# Patient Record
Sex: Female | Born: 1945
Health system: Southern US, Community
[De-identification: ages and names within clinical notes are randomized; demographics above are authoritative.]

## PROBLEM LIST (undated history)

## (undated) DIAGNOSIS — M899 Disorder of bone, unspecified: Secondary | ICD-10-CM

## (undated) DIAGNOSIS — M1990 Unspecified osteoarthritis, unspecified site: Secondary | ICD-10-CM

## (undated) DIAGNOSIS — Z79899 Other long term (current) drug therapy: Secondary | ICD-10-CM

## (undated) DIAGNOSIS — R5383 Other fatigue: Secondary | ICD-10-CM

## (undated) DIAGNOSIS — R51 Headache: Secondary | ICD-10-CM

## (undated) DIAGNOSIS — M949 Disorder of cartilage, unspecified: Secondary | ICD-10-CM

## (undated) DIAGNOSIS — M810 Age-related osteoporosis without current pathological fracture: Secondary | ICD-10-CM

## (undated) DIAGNOSIS — K219 Gastro-esophageal reflux disease without esophagitis: Secondary | ICD-10-CM

## (undated) DIAGNOSIS — L2089 Other atopic dermatitis: Secondary | ICD-10-CM

## (undated) DIAGNOSIS — J210 Acute bronchiolitis due to respiratory syncytial virus: Secondary | ICD-10-CM

## (undated) DIAGNOSIS — J218 Acute bronchiolitis due to other specified organisms: Secondary | ICD-10-CM

## (undated) DIAGNOSIS — Z7901 Long term (current) use of anticoagulants: Secondary | ICD-10-CM

## (undated) DIAGNOSIS — Z789 Other specified health status: Secondary | ICD-10-CM

## (undated) DIAGNOSIS — E559 Vitamin D deficiency, unspecified: Secondary | ICD-10-CM

## (undated) DIAGNOSIS — R209 Unspecified disturbances of skin sensation: Secondary | ICD-10-CM

## (undated) DIAGNOSIS — Z01818 Encounter for other preprocedural examination: Secondary | ICD-10-CM

## (undated) DIAGNOSIS — M1710 Unilateral primary osteoarthritis, unspecified knee: Secondary | ICD-10-CM

## (undated) DIAGNOSIS — IMO0002 Reserved for concepts with insufficient information to code with codable children: Secondary | ICD-10-CM

## (undated) DIAGNOSIS — R5381 Other malaise: Secondary | ICD-10-CM

## (undated) DIAGNOSIS — E785 Hyperlipidemia, unspecified: Secondary | ICD-10-CM

## (undated) DIAGNOSIS — E039 Hypothyroidism, unspecified: Secondary | ICD-10-CM

## (undated) HISTORY — DX: Other long term (current) drug therapy: Z79.899

## (undated) HISTORY — DX: Other fatigue: R53.83

## (undated) HISTORY — DX: Hyperlipidemia, unspecified: E78.5

## (undated) HISTORY — DX: Acute bronchiolitis due to other specified organisms: J21.8

## (undated) HISTORY — DX: Disorder of bone, unspecified: M89.9

## (undated) HISTORY — DX: Encounter for other preprocedural examination: Z01.818

## (undated) HISTORY — DX: Age-related osteoporosis without current pathological fracture: M81.0

## (undated) HISTORY — DX: Reserved for concepts with insufficient information to code with codable children: IMO0002

## (undated) HISTORY — DX: Other atopic dermatitis: L20.89

## (undated) HISTORY — DX: Vitamin D deficiency, unspecified: E55.9

## (undated) HISTORY — DX: Long term (current) use of anticoagulants: Z79.01

## (undated) HISTORY — PX: OTHER SURGICAL HISTORY: SHX169

## (undated) HISTORY — DX: Other malaise: R53.81

## (undated) HISTORY — DX: Disorder of cartilage, unspecified: M94.9

## (undated) HISTORY — PX: CERVICAL BIOPSY  W/ LOOP ELECTRODE EXCISION: SUR135

## (undated) HISTORY — DX: Acute bronchiolitis due to respiratory syncytial virus: J21.0

## (undated) HISTORY — DX: Unilateral primary osteoarthritis, unspecified knee: M17.10

## (undated) HISTORY — DX: Unspecified disturbances of skin sensation: R20.9

---

## 2009-09-02 ENCOUNTER — Encounter: Admission: RE | Admit: 2009-09-02 | Discharge: 2009-09-02 | Payer: Self-pay | Admitting: Internal Medicine

## 2009-09-02 LAB — HM DEXA SCAN

## 2010-05-28 ENCOUNTER — Encounter: Payer: Self-pay | Admitting: Internal Medicine

## 2011-01-30 ENCOUNTER — Ambulatory Visit: Payer: Medicare Other | Attending: Sports Medicine | Admitting: Physical Therapy

## 2011-01-30 DIAGNOSIS — M25669 Stiffness of unspecified knee, not elsewhere classified: Secondary | ICD-10-CM | POA: Insufficient documentation

## 2011-01-30 DIAGNOSIS — M25569 Pain in unspecified knee: Secondary | ICD-10-CM | POA: Insufficient documentation

## 2011-01-30 DIAGNOSIS — M545 Low back pain, unspecified: Secondary | ICD-10-CM | POA: Insufficient documentation

## 2011-01-30 DIAGNOSIS — IMO0001 Reserved for inherently not codable concepts without codable children: Secondary | ICD-10-CM | POA: Insufficient documentation

## 2011-04-02 ENCOUNTER — Other Ambulatory Visit: Payer: Self-pay | Admitting: Internal Medicine

## 2011-04-02 DIAGNOSIS — Z1231 Encounter for screening mammogram for malignant neoplasm of breast: Secondary | ICD-10-CM

## 2011-04-19 ENCOUNTER — Ambulatory Visit
Admission: RE | Admit: 2011-04-19 | Discharge: 2011-04-19 | Disposition: A | Discharge status: Home / Self Care | Payer: Medicare Other | Source: Ambulatory Visit | Attending: Internal Medicine | Admitting: Internal Medicine

## 2011-04-19 DIAGNOSIS — Z1231 Encounter for screening mammogram for malignant neoplasm of breast: Secondary | ICD-10-CM

## 2011-04-19 LAB — HM MAMMOGRAPHY: HM Mammogram: NEGATIVE

## 2011-04-24 ENCOUNTER — Ambulatory Visit: Payer: Medicare Other

## 2011-11-07 DIAGNOSIS — Z01818 Encounter for other preprocedural examination: Secondary | ICD-10-CM

## 2011-11-07 HISTORY — DX: Encounter for other preprocedural examination: Z01.818

## 2011-11-21 NOTE — H&P (Signed)
[name] [name] DOB: 12/31/1945  Chief Complaint: Right knee pain   History of Present Illness The patient is a 66 year old female who comes in today for a preoperative History and Physical. The patient is scheduled for a right total knee arthroplasty to be performed by Dr. Georges Lynch. [name] Hillock, MD at [facility] on Wednesday November 28, 2011 . [name] [name] started having pain in the right knee at the end of 2012. She did not injure the knee that she was aware of. She has pain with weightbearing and at times has trouble with instability in the knee. She has had cortisone injections in the knee with limited improvement in her symptoms. Due to failure of conservative measures, the most predictable means for increased function and decreased pain in the right knee is a right total knee arthroplasty. Risks and benefits discussed.    Problem List/Past MedicalHistory Thigh pain (729.5) Radiculitis, Thoracic or Lumbar (724.4) Osteoarthritis, Knee (715.96) Lumbar disc degeneration (722.52) Bursitis, Hip (726.5) Hypercholesterolemia Hypothyroidism Osteoporosis Impaired Vision. wears glasses   Allergies No Known Drug Allergies.    Family History Father. deceased age 45; unsure of cause Mother. deceased age 76 due to "old age"   Social History Children. 3 Alcohol use. never consumed alcohol Current work status. retired Marital status. married Illicit drug use. yes Tobacco use. never smoker   Medication History Omeprazole Magnesium (20MG  Tablet DR, Oral) Active. Levothyroxine Sodium ( Tablet, Oral) Active. Simvastatin (40MG  Tablet, Oral) Active. Alendronate Sodium (70MG  Tablet Effer, Oral) Active. Aspirin EC (81MG  Tablet DR, Oral) Active. Calcium 600 (600MG  Tablet, Oral) Active. Vitamin B 12 ( Oral) Specific dose unknown - Active.   Past Surgical History No pertinent past surgical history    Review of Systems General:Not Present-  Chills, Fever, Night Sweats, Fatigue, Weight Gain, Weight Loss and Memory Loss. Skin:Not Present- Hives, Itching, Rash, Eczema and Lesions. HEENT:Present- Double Vision. Not Present- Tinnitus, Headache, Visual Loss, Hearing Loss and Dentures. Respiratory:Not Present- Shortness of breath with exertion, Shortness of breath at rest, Allergies, Coughing up blood and Chronic Cough. Cardiovascular:Not Present- Chest Pain, Racing/skipping heartbeats, Difficulty Breathing Lying Down, Murmur, Swelling and Palpitations. Gastrointestinal:Not Present- Bloody Stool, Heartburn, Abdominal Pain, Vomiting, Nausea, Constipation, Diarrhea, Difficulty Swallowing, Jaundice and Loss of appetitie. Female Genitourinary:Not Present- Blood in Urine, Urinary frequency, Weak urinary stream, Discharge, Flank Pain, Incontinence, Painful Urination, Urgency, Urinary Retention and Urinating at Night. Musculoskeletal:Present- Joint Swelling and Joint Pain. Not Present- Muscle Weakness, Muscle Pain, Back Pain, Morning Stiffness and Spasms. Neurological:Not Present- Tremor, Dizziness, Blackout spells, Paralysis, Difficulty with balance and Weakness. Psychiatric:Not Present- Insomnia.   Vitals Weight: 125 lb Height: 58 in Body Surface Area: 1.52 m Body Mass Index: 26.12 kg/m Pulse: 88 (Regular) BP: 141/95 (Sitting, Left Arm, Standard)    Physical Exam General Mental Status - Alert, cooperative and good historian. General Appearance- pleasant. Not in acute distress. Orientation- Oriented X3. Build & Nutrition- Well nourished and Well developed. Head and Neck Head- normocephalic, atraumatic . Neck Global Assessment- supple. no bruit auscultated on the right and no bruit auscultated on the left. Eye Pupil- Bilateral- Regular and Round. Motion- Bilateral- EOMI. Chest and Lung Exam Auscultation: Breath sounds:- clear at anterior chest wall and - clear at posterior chest  wall. Adventitious sounds:- No Adventitious sounds. Cardiovascular Auscultation:Rhythm- Regular rate and rhythm. Heart Sounds- S1 WNL and S2 WNL. Murmurs & Other Heart Sounds:Auscultation of the heart reveals - No Murmurs. Abdomen Palpation/Percussion:Tenderness- Abdomen is non-tender to palpation. Rigidity (guarding)- Abdomen is soft. Auscultation:Auscultation  of the abdomen reveals - Bowel sounds normal. Female Genitourinary Not done, not pertinent to present illness Peripheral Vascular Upper Extremity: Palpation:- Pulses bilaterally normal Neurologic Examination of related systems reveals - normal muscle strength and tone in all extremities. Neurologic evaluation reveals - normal sensation and upper and lower extremity deep tendon reflexes intact bilaterally . Musculoskeletal Range of motion of her right knee is painful. She has a genu varus. She has swelling over the medial aspect of her knee. No masses and no tumors. Her popliteal space is fine. Extension 0 degrees to flexion of 110 degrees. Moderate patellofemoral crepitus.   Assessment & Plan Osteoarthritis, Knee (715.96) Right total knee arthroplasty     Dimitri Ped, PA-C

## 2011-11-22 ENCOUNTER — Encounter (HOSPITAL_COMMUNITY)
Admission: RE | Admit: 2011-11-22 | Discharge: 2011-11-22 | Disposition: A | Discharge status: Home / Self Care | Payer: Medicare Other | Source: Ambulatory Visit | Attending: Orthopedic Surgery | Admitting: Orthopedic Surgery

## 2011-11-22 ENCOUNTER — Encounter (HOSPITAL_COMMUNITY): Payer: Self-pay | Admitting: Pharmacy Technician

## 2011-11-22 ENCOUNTER — Encounter (HOSPITAL_COMMUNITY): Payer: Self-pay

## 2011-11-22 HISTORY — DX: Other specified health status: Z78.9

## 2011-11-22 HISTORY — DX: Unspecified osteoarthritis, unspecified site: M19.90

## 2011-11-22 HISTORY — DX: Hypothyroidism, unspecified: E03.9

## 2011-11-22 HISTORY — DX: Gastro-esophageal reflux disease without esophagitis: K21.9

## 2011-11-22 HISTORY — DX: Headache: R51

## 2011-11-22 LAB — CBC
HCT: 40.5 % (ref 36.0–46.0)
Hemoglobin: 13.7 g/dL (ref 12.0–15.0)
MCH: 30.3 pg (ref 26.0–34.0)
MCHC: 33.8 g/dL (ref 30.0–36.0)
MCV: 89.6 fL (ref 78.0–100.0)
Platelets: 312 10*3/uL (ref 150–400)
RBC: 4.52 MIL/uL (ref 3.87–5.11)
RDW: 12.6 % (ref 11.5–15.5)
WBC: 9 10*3/uL (ref 4.0–10.5)

## 2011-11-22 LAB — COMPREHENSIVE METABOLIC PANEL
ALT: 38 U/L — ABNORMAL HIGH (ref 0–35)
AST: 29 U/L (ref 0–37)
Albumin: 4.2 g/dL (ref 3.5–5.2)
Alkaline Phosphatase: 75 U/L (ref 39–117)
BUN: 8 mg/dL (ref 6–23)
CO2: 27 mEq/L (ref 19–32)
Calcium: 10.1 mg/dL (ref 8.4–10.5)
Chloride: 102 mEq/L (ref 96–112)
Creatinine, Ser: 0.57 mg/dL (ref 0.50–1.10)
GFR calc Af Amer: >90 mL/min (ref >90)
GFR calc non Af Amer: >90 mL/min (ref >90)
Glucose, Bld: 105 mg/dL — ABNORMAL HIGH (ref 70–99)
Potassium: 4.3 mEq/L (ref 3.5–5.1)
Sodium: 140 mEq/L (ref 135–145)
Total Bilirubin: 0.5 mg/dL (ref 0.3–1.2)
Total Protein: 7.6 g/dL (ref 6.0–8.3)

## 2011-11-22 LAB — PROTIME-INR
INR: 0.97 (ref 0.00–1.49)
Prothrombin Time: 13.1 seconds (ref 11.6–15.2)

## 2011-11-22 LAB — SURGICAL PCR SCREEN
MRSA, PCR: NEGATIVE
Staphylococcus aureus: NEGATIVE

## 2011-11-22 LAB — DIFFERENTIAL
Basophils Absolute: 0.1 10*3/uL (ref 0.0–0.1)
Basophils Relative: 1 % (ref 0–1)
Eosinophils Absolute: 0.1 10*3/uL (ref 0.0–0.7)
Eosinophils Relative: 1 % (ref 0–5)
Lymphocytes Relative: 31 % (ref 12–46)
Lymphs Abs: 2.8 10*3/uL (ref 0.7–4.0)
Monocytes Absolute: 0.9 10*3/uL (ref 0.1–1.0)
Monocytes Relative: 10 % (ref 3–12)
Neutro Abs: 5.2 10*3/uL (ref 1.7–7.7)
Neutrophils Relative %: 57 % (ref 43–77)

## 2011-11-22 LAB — URINALYSIS, ROUTINE W REFLEX MICROSCOPIC
Bilirubin Urine: NEGATIVE
Glucose, UA: NEGATIVE mg/dL
Ketones, ur: NEGATIVE mg/dL
Leukocytes, UA: NEGATIVE
Nitrite: NEGATIVE
Protein, ur: NEGATIVE mg/dL
Specific Gravity, Urine: 1.009 (ref 1.005–1.030)
Urobilinogen, UA: 0.2 mg/dL (ref 0.0–1.0)
pH: 7 (ref 5.0–8.0)

## 2011-11-22 LAB — URINE MICROSCOPIC-ADD ON

## 2011-11-22 LAB — APTT: aPTT: 31 seconds (ref 24–37)

## 2011-11-22 NOTE — Patient Instructions (Addendum)
YOUR SURGERY IS SCHEDULED ON:  WED  7/24  AT 8:15 AM  REPORT TO [facility] AT:  6:15 AM      PHONE # FOR SHORT STAY IS [phone]  DO NOT EAT OR DRINK ANYTHING AFTER MIDNIGHT THE NIGHT BEFORE YOUR SURGERY.  YOU MAY BRUSH YOUR TEETH, RINSE OUT YOUR MOUTH--BUT NO WATER, NO FOOD, NO CHEWING GUM, NO MINTS, NO CANDIES, NO CHEWING TOBACCO.  PLEASE TAKE THE FOLLOWING MEDICATIONS THE AM OF YOUR SURGERY WITH A FEW SIPS OF WATER:  LEVOTHYROXINE AND OMEPRAZOLE    IF YOU USE INHALERS--USE YOUR INHALERS THE AM OF YOUR SURGERY AND BRING INHALERS TO THE HOSPITAL -TAKE TO SURGERY.    IF YOU ARE DIABETIC:  DO NOT TAKE ANY DIABETIC MEDICATIONS THE AM OF YOUR SURGERY.  IF YOU TAKE INSULIN IN THE EVENINGS--PLEASE ONLY TAKE 1/2 NORMAL EVENING DOSE THE NIGHT BEFORE YOUR SURGERY.  NO INSULIN THE AM OF YOUR SURGERY.  IF YOU HAVE SLEEP APNEA AND USE CPAP OR BIPAP--PLEASE BRING THE MASK --NOT THE MACHINE-NOT THE TUBING   -JUST THE MASK. DO NOT BRING VALUABLES, MONEY, CREDIT CARDS.  CONTACT LENS, DENTURES / PARTIALS, GLASSES SHOULD NOT BE WORN TO SURGERY AND IN MOST CASES-HEARING AIDS WILL NEED TO BE REMOVED.  BRING YOUR GLASSES CASE, ANY EQUIPMENT NEEDED FOR YOUR CONTACT LENS. FOR PATIENTS ADMITTED TO THE HOSPITAL--CHECK OUT TIME THE DAY OF DISCHARGE IS 11:00 AM.  ALL INPATIENT ROOMS ARE PRIVATE - WITH BATHROOM, TELEPHONE, TELEVISION AND WIFI INTERNET. IF YOU ARE BEING DISCHARGED THE SAME DAY OF YOUR SURGERY--YOU CAN NOT DRIVE YOURSELF HOME--AND SHOULD NOT GO HOME ALONE BY TAXI OR BUS.  NO DRIVING OR OPERATING MACHINERY FOR 24 HOURS FOLLOWING ANESTHESIA / PAIN MEDICATIONS.                            SPECIAL INSTRUCTIONS:  CHLORHEXIDINE SOAP SHOWER (other brand names are Betasept and Hibiclens ) PLEASE SHOWER WITH CHLORHEXIDINE THE NIGHT BEFORE YOUR SURGERY AND THE AM OF YOUR SURGERY. DO NOT USE CHLORHEXIDINE ON YOUR FACE OR PRIVATE AREAS--YOU MAY USE YOUR NORMAL SOAP THOSE AREAS AND YOUR NORMAL  SHAMPOO.  WOMEN SHOULD AVOID SHAVING UNDER ARMS AND SHAVING LEGS 48 HOURS BEFORE USING CHLORHEXIDINE TO AVOID SKIN IRRITATION.  DO NOT USE IF ALLERGIC TO CHLORHEXIDINE.  PLEASE READ OVER ANY  FACT SHEETS THAT YOU WERE GIVEN: MRSA INFORMATION, BLOOD TRANSFUSION INFORMATION, INCENTIVE SPIROMETER INFORMATION.

## 2011-11-22 NOTE — Pre-Procedure Instructions (Signed)
PT HAS AN EKG REPORT, OFFICE NOTE AND MEDICAL CLEARANCE FOR HER RIGHT TOTAL KNEE REPLACEMENT--REPORTS FROM 11/07/11  [facility] / DR. PANDEY. CXR NOT NEEDED PREOP - PER ANESTHESIOLOGIST'S GUIDELINES. CBC WITH DIFF, CMET, PT, PTT AND UA WERE DONE TODAY - PREOP - AT [facility]-AS PER ORDERS DR. Darrelyn Hillock AND THE T/S WILL BE DRAWN ON ARRIVAL FOR SURGERY. PT ACCOMPANIED TO PREOP APPT BY HER HUSBAND AND HER SON RAJ--PT AND HUSBAND UNDERSTAND AND SPEAK SOME ENGLISH - BUT SHE NEEDED HER SON TO HELP WITH INTERPRETATION AND PT SIGNED A RELEASE OF INTERPRETATION FOR RAJ.  I HAVE REQUESTED GUJARATI OR HINDI INTERPRETER FOR DAY OF SURGERY VIA [facility]. PREOP INSTRUCTIONS DISCUSSED WITH PT AND HER FAMILY USING TEACH BACK METHOD.

## 2011-11-28 ENCOUNTER — Inpatient Hospital Stay (HOSPITAL_COMMUNITY)
Admission: RE | Admit: 2011-11-28 | Discharge: 2011-12-01 | DRG: 470 | Disposition: A | Discharge status: Home Health | Payer: Medicare Other | Source: Ambulatory Visit | Attending: Orthopedic Surgery | Admitting: Orthopedic Surgery

## 2011-11-28 ENCOUNTER — Encounter (HOSPITAL_COMMUNITY): Payer: Self-pay | Admitting: Anesthesiology

## 2011-11-28 ENCOUNTER — Inpatient Hospital Stay (HOSPITAL_COMMUNITY): Payer: Medicare Other

## 2011-11-28 ENCOUNTER — Encounter (HOSPITAL_COMMUNITY)
Admission: RE | Disposition: A | Discharge status: Home Health | Payer: Self-pay | Source: Ambulatory Visit | Attending: Orthopedic Surgery

## 2011-11-28 ENCOUNTER — Encounter (HOSPITAL_COMMUNITY): Payer: Self-pay | Admitting: *Deleted

## 2011-11-28 ENCOUNTER — Ambulatory Visit (HOSPITAL_COMMUNITY): Payer: Medicare Other | Admitting: Anesthesiology

## 2011-11-28 DIAGNOSIS — M1710 Unilateral primary osteoarthritis, unspecified knee: Principal | ICD-10-CM | POA: Diagnosis present

## 2011-11-28 DIAGNOSIS — K219 Gastro-esophageal reflux disease without esophagitis: Secondary | ICD-10-CM | POA: Diagnosis present

## 2011-11-28 DIAGNOSIS — Z96659 Presence of unspecified artificial knee joint: Secondary | ICD-10-CM

## 2011-11-28 DIAGNOSIS — E039 Hypothyroidism, unspecified: Secondary | ICD-10-CM | POA: Diagnosis present

## 2011-11-28 DIAGNOSIS — D62 Acute posthemorrhagic anemia: Secondary | ICD-10-CM | POA: Diagnosis not present

## 2011-11-28 DIAGNOSIS — Z01812 Encounter for preprocedural laboratory examination: Secondary | ICD-10-CM

## 2011-11-28 HISTORY — PX: TOTAL KNEE ARTHROPLASTY: SHX125

## 2011-11-28 LAB — TYPE AND SCREEN
ABO/RH(D): O POS
Antibody Screen: NEGATIVE

## 2011-11-28 SURGERY — ARTHROPLASTY, KNEE, TOTAL
Anesthesia: General | Site: Knee | Laterality: Right | Wound class: Clean

## 2011-11-28 MED ORDER — CEFAZOLIN SODIUM 1-5 GM-% IV SOLN
1.0000 g | Freq: Four times a day (QID) | INTRAVENOUS | Status: AC
  Administered 2011-11-28 (×2): 1 g via INTRAVENOUS
  Filled 2011-11-28 (×2): qty 50

## 2011-11-28 MED ORDER — LACTATED RINGERS IV SOLN
INTRAVENOUS | Status: DC
  Administered 2011-11-28: 500 mL via INTRAVENOUS
  Administered 2011-11-28 – 2011-11-29 (×4): via INTRAVENOUS

## 2011-11-28 MED ORDER — ONDANSETRON HCL 4 MG/2ML IJ SOLN
4.0000 mg | Freq: Four times a day (QID) | INTRAMUSCULAR | Status: DC | PRN
  Administered 2011-11-28 – 2011-11-30 (×2): 4 mg via INTRAVENOUS
  Filled 2011-11-28 (×2): qty 2

## 2011-11-28 MED ORDER — LIDOCAINE HCL (CARDIAC) 20 MG/ML IV SOLN
INTRAVENOUS | Status: DC | PRN
  Administered 2011-11-28: 50 mg via INTRAVENOUS

## 2011-11-28 MED ORDER — FERROUS SULFATE 325 (65 FE) MG PO TABS
325.0000 mg | ORAL_TABLET | Freq: Three times a day (TID) | ORAL | Status: DC
  Administered 2011-11-28 – 2011-12-01 (×8): 325 mg via ORAL
  Filled 2011-11-28 (×12): qty 1

## 2011-11-28 MED ORDER — PHENOL 1.4 % MT LIQD
1.0000 | OROMUCOSAL | Status: DC | PRN
  Administered 2011-11-28: 1 via OROMUCOSAL
  Filled 2011-11-28: qty 177

## 2011-11-28 MED ORDER — PHENYLEPHRINE HCL 10 MG/ML IJ SOLN
INTRAMUSCULAR | Status: DC | PRN
  Administered 2011-11-28: 20 ug via INTRAVENOUS
  Administered 2011-11-28 (×2): 10 ug via INTRAVENOUS
  Administered 2011-11-28: 40 ug via INTRAVENOUS

## 2011-11-28 MED ORDER — HYDROMORPHONE HCL PF 1 MG/ML IJ SOLN
INTRAMUSCULAR | Status: DC | PRN
Start: 2011-11-28 — End: 2011-11-28
  Administered 2011-11-28: .5 mg via INTRAVENOUS
  Administered 2011-11-28 (×3): 0.5 mg via INTRAVENOUS

## 2011-11-28 MED ORDER — ONDANSETRON HCL 4 MG/2ML IJ SOLN
INTRAMUSCULAR | Status: DC | PRN
  Administered 2011-11-28 (×2): 2 mg via INTRAVENOUS

## 2011-11-28 MED ORDER — CEFAZOLIN SODIUM-DEXTROSE 2-3 GM-% IV SOLR
INTRAVENOUS | Status: AC
  Filled 2011-11-28: qty 50

## 2011-11-28 MED ORDER — DEXAMETHASONE SODIUM PHOSPHATE 10 MG/ML IJ SOLN
INTRAMUSCULAR | Status: DC | PRN
  Administered 2011-11-28: 10 mg via INTRAVENOUS

## 2011-11-28 MED ORDER — THROMBIN 5000 UNITS EX SOLR
CUTANEOUS | Status: DC | PRN
  Administered 2011-11-28: 5000 [IU] via TOPICAL

## 2011-11-28 MED ORDER — BUPIVACAINE LIPOSOME 1.3 % IJ SUSP
INTRAMUSCULAR | Status: DC | PRN
  Administered 2011-11-28: 40 mL

## 2011-11-28 MED ORDER — LACTATED RINGERS IV SOLN: INTRAVENOUS | Status: DC

## 2011-11-28 MED ORDER — ALUM & MAG HYDROXIDE-SIMETH 200-200-20 MG/5ML PO SUSP: 30.0000 mL | ORAL | Status: DC | PRN

## 2011-11-28 MED ORDER — THROMBIN 5000 UNITS EX SOLR
CUTANEOUS | Status: AC
  Filled 2011-11-28: qty 5000

## 2011-11-28 MED ORDER — SODIUM CHLORIDE 0.9 % IR SOLN
Status: DC | PRN
  Administered 2011-11-28: 08:00:00

## 2011-11-28 MED ORDER — BIOTENE DRY MOUTH MT LIQD
15.0000 mL | Freq: Two times a day (BID) | OROMUCOSAL | Status: DC
  Administered 2011-11-28 – 2011-11-30 (×5): 15 mL via OROMUCOSAL

## 2011-11-28 MED ORDER — OXYCODONE-ACETAMINOPHEN 5-325 MG PO TABS
2.0000 | ORAL_TABLET | ORAL | Status: DC | PRN
  Administered 2011-11-28 – 2011-11-30 (×7): 2 via ORAL
  Administered 2011-11-30: 1 via ORAL
  Administered 2011-11-30 (×3): 2 via ORAL
  Administered 2011-12-01 (×2): 1 via ORAL
  Filled 2011-11-28: qty 2
  Filled 2011-11-28 (×2): qty 1
  Filled 2011-11-28 (×3): qty 2
  Filled 2011-11-28: qty 1
  Filled 2011-11-28 (×6): qty 2

## 2011-11-28 MED ORDER — ACETAMINOPHEN 325 MG PO TABS: 650.0000 mg | ORAL_TABLET | Freq: Four times a day (QID) | ORAL | Status: DC | PRN

## 2011-11-28 MED ORDER — BACITRACIN ZINC 500 UNIT/GM EX OINT
TOPICAL_OINTMENT | CUTANEOUS | Status: AC
  Filled 2011-11-28: qty 15

## 2011-11-28 MED ORDER — BISACODYL 10 MG RE SUPP: 10.0000 mg | Freq: Every day | RECTAL | Status: DC | PRN

## 2011-11-28 MED ORDER — FLEET ENEMA 7-19 GM/118ML RE ENEM: 1.0000 | ENEMA | Freq: Once | RECTAL | Status: AC | PRN

## 2011-11-28 MED ORDER — LEVOTHYROXINE SODIUM 50 MCG PO TABS
50.0000 ug | ORAL_TABLET | Freq: Every day | ORAL | Status: DC
  Administered 2011-11-29 – 2011-12-01 (×3): 50 ug via ORAL
  Filled 2011-11-28 (×4): qty 1

## 2011-11-28 MED ORDER — PROMETHAZINE HCL 25 MG/ML IJ SOLN: 6.2500 mg | INTRAMUSCULAR | Status: DC | PRN

## 2011-11-28 MED ORDER — MENTHOL 3 MG MT LOZG
1.0000 | LOZENGE | OROMUCOSAL | Status: DC | PRN
  Filled 2011-11-28: qty 9

## 2011-11-28 MED ORDER — HYDROMORPHONE HCL PF 1 MG/ML IJ SOLN
INTRAMUSCULAR | Status: AC
  Administered 2011-11-28: 1 mg via INTRAVENOUS
  Filled 2011-11-28: qty 1

## 2011-11-28 MED ORDER — ONDANSETRON HCL 4 MG PO TABS: 4.0000 mg | ORAL_TABLET | Freq: Four times a day (QID) | ORAL | Status: DC | PRN

## 2011-11-28 MED ORDER — SODIUM CHLORIDE 0.9 % IR SOLN
Status: DC | PRN
  Administered 2011-11-28: 3000 mL

## 2011-11-28 MED ORDER — FENTANYL CITRATE 0.05 MG/ML IJ SOLN
INTRAMUSCULAR | Status: DC | PRN
  Administered 2011-11-28: 50 ug via INTRAVENOUS

## 2011-11-28 MED ORDER — MIDAZOLAM HCL 5 MG/5ML IJ SOLN
INTRAMUSCULAR | Status: DC | PRN
  Administered 2011-11-28: 0.5 mg via INTRAVENOUS
  Administered 2011-11-28: 1 mg via INTRAVENOUS

## 2011-11-28 MED ORDER — LACTATED RINGERS IV SOLN
INTRAVENOUS | Status: DC | PRN
  Administered 2011-11-28 (×3): via INTRAVENOUS

## 2011-11-28 MED ORDER — POLYETHYLENE GLYCOL 3350 17 G PO PACK: 17.0000 g | PACK | Freq: Every day | ORAL | Status: DC | PRN

## 2011-11-28 MED ORDER — CISATRACURIUM BESYLATE (PF) 10 MG/5ML IV SOLN
INTRAVENOUS | Status: DC | PRN
  Administered 2011-11-28: 6 mg via INTRAVENOUS

## 2011-11-28 MED ORDER — METOCLOPRAMIDE HCL 5 MG/ML IJ SOLN
INTRAMUSCULAR | Status: DC | PRN
  Administered 2011-11-28: 5 mg via INTRAVENOUS

## 2011-11-28 MED ORDER — SIMVASTATIN 40 MG PO TABS
40.0000 mg | ORAL_TABLET | Freq: Every evening | ORAL | Status: DC
  Administered 2011-11-28 – 2011-11-30 (×3): 40 mg via ORAL
  Filled 2011-11-28 (×5): qty 1

## 2011-11-28 MED ORDER — CEFAZOLIN SODIUM-DEXTROSE 2-3 GM-% IV SOLR
2.0000 g | INTRAVENOUS | Status: AC
  Administered 2011-11-28: 2 g via INTRAVENOUS

## 2011-11-28 MED ORDER — PROPOFOL 10 MG/ML IV EMUL
INTRAVENOUS | Status: DC | PRN
  Administered 2011-11-28: 25 mg via INTRAVENOUS
  Administered 2011-11-28: 150 mg via INTRAVENOUS
  Administered 2011-11-28: 25 mg via INTRAVENOUS

## 2011-11-28 MED ORDER — CHLORHEXIDINE GLUCONATE 4 % EX LIQD: 60.0000 mL | Freq: Once | CUTANEOUS | Status: DC

## 2011-11-28 MED ORDER — HYDROMORPHONE HCL PF 1 MG/ML IJ SOLN
1.0000 mg | INTRAMUSCULAR | Status: DC | PRN
  Administered 2011-11-28 – 2011-11-30 (×3): 1 mg via INTRAVENOUS
  Filled 2011-11-28 (×3): qty 1

## 2011-11-28 MED ORDER — METHOCARBAMOL 500 MG PO TABS
500.0000 mg | ORAL_TABLET | Freq: Four times a day (QID) | ORAL | Status: DC | PRN
  Administered 2011-11-29 (×3): 500 mg via ORAL
  Filled 2011-11-28 (×3): qty 1

## 2011-11-28 MED ORDER — HYDROMORPHONE HCL PF 1 MG/ML IJ SOLN
0.2500 mg | INTRAMUSCULAR | Status: DC | PRN
  Administered 2011-11-28 (×2): 0.25 mg via INTRAVENOUS

## 2011-11-28 MED ORDER — RIVAROXABAN 10 MG PO TABS
10.0000 mg | ORAL_TABLET | Freq: Every day | ORAL | Status: DC
  Administered 2011-11-29 – 2011-12-01 (×3): 10 mg via ORAL
  Filled 2011-11-28 (×4): qty 1

## 2011-11-28 MED ORDER — METHOCARBAMOL 100 MG/ML IJ SOLN
500.0000 mg | Freq: Four times a day (QID) | INTRAVENOUS | Status: DC | PRN
  Administered 2011-11-28: 500 mg via INTRAVENOUS
  Filled 2011-11-28: qty 5

## 2011-11-28 MED ORDER — SUFENTANIL CITRATE 50 MCG/ML IV SOLN
INTRAVENOUS | Status: DC | PRN
  Administered 2011-11-28: 20 ug via INTRAVENOUS
  Administered 2011-11-28: 5 ug via INTRAVENOUS
  Administered 2011-11-28 (×3): 10 ug via INTRAVENOUS
  Administered 2011-11-28: 20 ug via INTRAVENOUS
  Administered 2011-11-28: 5 ug via INTRAVENOUS

## 2011-11-28 MED ORDER — ACETAMINOPHEN 650 MG RE SUPP: 650.0000 mg | Freq: Four times a day (QID) | RECTAL | Status: DC | PRN

## 2011-11-28 MED ORDER — SUCCINYLCHOLINE CHLORIDE 20 MG/ML IJ SOLN
INTRAMUSCULAR | Status: DC | PRN
  Administered 2011-11-28: 100 mg via INTRAVENOUS

## 2011-11-28 MED ORDER — BUPIVACAINE LIPOSOME 1.3 % IJ SUSP
20.0000 mL | Freq: Once | INTRAMUSCULAR | Status: DC
  Filled 2011-11-28: qty 20

## 2011-11-28 SURGICAL SUPPLY — 67 items
BAG SPEC THK2 15X12 ZIP CLS (MISCELLANEOUS)
BAG ZIPLOCK 12X15 (MISCELLANEOUS) ×1 IMPLANT
BANDAGE ELASTIC 4 VELCRO ST LF (GAUZE/BANDAGES/DRESSINGS) ×2 IMPLANT
BANDAGE ELASTIC 6 VELCRO ST LF (GAUZE/BANDAGES/DRESSINGS) ×2 IMPLANT
BANDAGE ESMARK 6X9 LF (GAUZE/BANDAGES/DRESSINGS) ×1 IMPLANT
BANDAGE GAUZE ELAST BULKY 4 IN (GAUZE/BANDAGES/DRESSINGS) ×2 IMPLANT
BLADE SAG 18X100X1.27 (BLADE) ×2 IMPLANT
BLADE SAW SGTL 11.0X1.19X90.0M (BLADE) ×2 IMPLANT
BNDG CMPR 9X6 STRL LF SNTH (GAUZE/BANDAGES/DRESSINGS) ×1
BNDG ESMARK 6X9 LF (GAUZE/BANDAGES/DRESSINGS) ×2
BONE CEMENT GENTAMICIN (Cement) ×4 IMPLANT
CEMENT BONE GENTAMICIN 40 (Cement) ×2 IMPLANT
CLOTH BEACON ORANGE TIMEOUT ST (SAFETY) ×2 IMPLANT
CLSR STERI-STRIP ANTIMIC 1/2X4 (GAUZE/BANDAGES/DRESSINGS) ×2 IMPLANT
CUFF TOURN SGL QUICK 34 (TOURNIQUET CUFF) ×2
CUFF TRNQT CYL 34X4X40X1 (TOURNIQUET CUFF) ×1 IMPLANT
DRAPE EXTREMITY T 121X128X90 (DRAPE) ×2 IMPLANT
DRAPE INCISE IOBAN 66X45 STRL (DRAPES) ×2 IMPLANT
DRAPE LG THREE QUARTER DISP (DRAPES) ×2 IMPLANT
DRAPE POUCH INSTRU U-SHP 10X18 (DRAPES) ×2 IMPLANT
DRAPE U-SHAPE 47X51 STRL (DRAPES) ×2 IMPLANT
DRSG ADAPTIC 3X8 NADH LF (GAUZE/BANDAGES/DRESSINGS) ×2 IMPLANT
DRSG EMULSION OIL 3X16 NADH (GAUZE/BANDAGES/DRESSINGS) ×1 IMPLANT
DRSG PAD ABDOMINAL 8X10 ST (GAUZE/BANDAGES/DRESSINGS) ×5 IMPLANT
DURAPREP 26ML APPLICATOR (WOUND CARE) ×2 IMPLANT
ELECT REM PT RETURN 9FT ADLT (ELECTROSURGICAL) ×2
ELECTRODE REM PT RTRN 9FT ADLT (ELECTROSURGICAL) ×1 IMPLANT
EVACUATOR 1/8 PVC DRAIN (DRAIN) ×2 IMPLANT
FACESHIELD LNG OPTICON STERILE (SAFETY) ×12 IMPLANT
GLOVE BIOGEL PI IND STRL 8 (GLOVE) ×1 IMPLANT
GLOVE BIOGEL PI IND STRL 8.5 (GLOVE) IMPLANT
GLOVE BIOGEL PI INDICATOR 8 (GLOVE) ×1
GLOVE BIOGEL PI INDICATOR 8.5 (GLOVE)
GLOVE ECLIPSE 8.0 STRL XLNG CF (GLOVE) ×4 IMPLANT
GLOVE SURG SS PI 6.5 STRL IVOR (GLOVE) ×4 IMPLANT
GOWN PREVENTION PLUS LG XLONG (DISPOSABLE) ×4 IMPLANT
GOWN STRL REIN XL XLG (GOWN DISPOSABLE) ×2 IMPLANT
HANDPIECE INTERPULSE COAX TIP (DISPOSABLE) ×2
IMMOBILIZER KNEE 16 UNIV (MISCELLANEOUS) ×1 IMPLANT
IMMOBILIZER KNEE 20 (SOFTGOODS)
IMMOBILIZER KNEE 20 THIGH 36 (SOFTGOODS) IMPLANT
KIT BASIN OR (CUSTOM PROCEDURE TRAY) ×2 IMPLANT
MANIFOLD NEPTUNE II (INSTRUMENTS) ×2 IMPLANT
NEEDLE HYPO 22GX1.5 SAFETY (NEEDLE) ×2 IMPLANT
NS IRRIG 1000ML POUR BTL (IV SOLUTION) ×2 IMPLANT
PACK TOTAL JOINT (CUSTOM PROCEDURE TRAY) ×2 IMPLANT
POSITIONER SURGICAL ARM (MISCELLANEOUS) ×2 IMPLANT
SET HNDPC FAN SPRY TIP SCT (DISPOSABLE) ×1 IMPLANT
SET PAD KNEE POSITIONER (MISCELLANEOUS) ×2 IMPLANT
SPONGE GAUZE 4X4 12PLY (GAUZE/BANDAGES/DRESSINGS) ×1 IMPLANT
SPONGE LAP 18X18 X RAY DECT (DISPOSABLE) ×1 IMPLANT
SPONGE SURGIFOAM ABS GEL 100 (HEMOSTASIS) ×2 IMPLANT
STAPLER VISISTAT 35W (STAPLE) IMPLANT
SUCTION FRAZIER 12FR DISP (SUCTIONS) ×2 IMPLANT
SUT BONE WAX W31G (SUTURE) ×2 IMPLANT
SUT VIC AB 0 CT1 27 (SUTURE) ×4
SUT VIC AB 0 CT1 27XBRD ANTBC (SUTURE) ×2 IMPLANT
SUT VIC AB 1 CT1 27 (SUTURE) ×10
SUT VIC AB 1 CT1 27XBRD ANTBC (SUTURE) ×5 IMPLANT
SUT VIC AB 2-0 CT1 27 (SUTURE) ×6
SUT VIC AB 2-0 CT1 TAPERPNT 27 (SUTURE) ×3 IMPLANT
SYR 20CC LL (SYRINGE) ×2 IMPLANT
TOWEL OR 17X26 10 PK STRL BLUE (TOWEL DISPOSABLE) ×4 IMPLANT
TOWER CARTRIDGE SMART MIX (DISPOSABLE) ×2 IMPLANT
TRAY FOLEY CATH 14FRSI W/METER (CATHETERS) ×2 IMPLANT
WATER STERILE IRR 1500ML POUR (IV SOLUTION) ×2 IMPLANT
WRAP KNEE MAXI GEL POST OP (GAUZE/BANDAGES/DRESSINGS) ×4 IMPLANT

## 2011-11-28 NOTE — Progress Notes (Signed)
Utilization review completed.  

## 2011-11-28 NOTE — Anesthesia Postprocedure Evaluation (Signed)
Anesthesia Post Note  Patient: [name] [name]  Procedure(s) Performed: Procedure(s) (LRB): TOTAL KNEE ARTHROPLASTY (Right)  Anesthesia type: General  Patient location: PACU  Post pain: Pain level controlled  Post assessment: Post-op Vital signs reviewed  Last Vitals:  Filed Vitals:   11/28/11 1145  BP: 103/64  Pulse: 104  Temp:   Resp: 8    Post vital signs: Reviewed  Level of consciousness: sedated  Complications: No apparent anesthesia complications

## 2011-11-28 NOTE — Interval H&P Note (Signed)
History and Physical Interval Note:  11/28/2011 8:07 AM  [name] [name]  has presented today for surgery, with the diagnosis of osteoarthritis right knee  The various methods of treatment have been discussed with the patient and family. After consideration of risks, benefits and other options for treatment, the patient has consented to  Procedure(s) (LRB): TOTAL KNEE ARTHROPLASTY (Right) as a surgical intervention .  The patient's history has been reviewed, patient examined, no change in status, stable for surgery.  I have reviewed the patient's chart and labs.  Questions were answered to the patient's satisfaction.     Darcel Frane A

## 2011-11-28 NOTE — Preoperative (Signed)
Beta Blockers   Reason not to administer Beta Blockers:Not Applicable 

## 2011-11-28 NOTE — Brief Op Note (Signed)
11/28/2011  10:31 AM  PATIENT:  [name] [name]  66 y.o. female  PRE-OPERATIVE DIAGNOSIS:  osteoarthritis right knee  POST-OPERATIVE DIAGNOSIS:  osteoarthritis right knee  PROCEDURE:  Procedure(s) (LRB): TOTAL KNEE ARTHROPLASTY (Right)  SURGEON:  Surgeon(s) and Role:    * Jacki Cones, MD - Primary  PHYSICIAN ASSISTANT: Dimitri Ped PA    ANESTHESIA:   general  EBL:  Total I/O In: 1000 [I.V.:1000] Out: 375 [Urine:300; Other:75]  BLOOD ADMINISTERED:none  DRAINS: (1) Hemovact drain(s) in the right Knee with  Suction Open   LOCAL MEDICATIONS USED:  BUPIVICAINE 20cc mixed with 20cc of Normal Saline.  SPECIMEN:  No Specimen  DISPOSITION OF SPECIMEN:  N/A  COUNTS:  YES  TOURNIQUET:   Total Tourniquet Time Documented: Thigh (Right) - 92 minutes  DICTATION: .Other Dictation: Dictation Number [id number]  PLAN OF CARE: Admit to inpatient   PATIENT DISPOSITION:  Stable in OR   Delay start of Pharmacological VTE agent (>24hrs) due to surgical blood loss or risk of bleeding: yes

## 2011-11-28 NOTE — Anesthesia Procedure Notes (Signed)
Procedure Name: Intubation Date/Time: 11/28/2011 8:22 AM Performed by: Edison Pace Pre-anesthesia Checklist: Patient identified, Timeout performed, Emergency Drugs available, Suction available and Patient being monitored Patient Re-evaluated:Patient Re-evaluated prior to inductionOxygen Delivery Method: Circle system utilized Preoxygenation: Pre-oxygenation with 100% oxygen Intubation Type: IV induction and Cricoid Pressure applied Ventilation: Mask ventilation without difficulty Laryngoscope Size: Mac and 3 Grade View: Grade I Tube type: Oral Tube size: 7.5 mm Number of attempts: 1 Airway Equipment and Method: Stylet Placement Confirmation: ETT inserted through vocal cords under direct vision,  positive ETCO2 and breath sounds checked- equal and bilateral Secured at: 20 cm Tube secured with: Tape Dental Injury: Teeth and Oropharynx as per pre-operative assessment

## 2011-11-28 NOTE — Anesthesia Preprocedure Evaluation (Addendum)
Anesthesia Evaluation  Patient identified by MRN, date of birth, ID band Patient awake    Reviewed: Allergy & Precautions, H&P , NPO status , Patient's Chart, lab work & pertinent test results  Airway Mallampati: II TM Distance: >3 FB Neck ROM: Full    Dental  (+) Teeth Intact, Dental Advisory Given and Caps,    Pulmonary neg pulmonary ROS,  breath sounds clear to auscultation  Pulmonary exam normal       Cardiovascular negative cardio ROS  Rhythm:Regular Rate:Normal     Neuro/Psych  Headaches, negative psych ROS   GI/Hepatic Neg liver ROS, GERD-  Medicated,  Endo/Other  Hypothyroidism   Renal/GU negative Renal ROS  negative genitourinary   Musculoskeletal negative musculoskeletal ROS (+)   Abdominal   Peds negative pediatric ROS (+)  Hematology negative hematology ROS (+)   Anesthesia Other Findings   Reproductive/Obstetrics negative OB ROS                          Anesthesia Physical Anesthesia Plan  ASA: II  Anesthesia Plan: General   Post-op Pain Management:    Induction: Intravenous  Airway Management Planned: Oral ETT  Additional Equipment:   Intra-op Plan:   Post-operative Plan: Extubation in OR  Informed Consent: I have reviewed the patients History and Physical, chart, labs and discussed the procedure including the risks, benefits and alternatives for the proposed anesthesia with the patient or authorized representative who has indicated his/her understanding and acceptance.   Dental advisory given  Plan Discussed with: CRNA  Anesthesia Plan Comments:         Anesthesia Quick Evaluation

## 2011-11-28 NOTE — Transfer of Care (Signed)
Immediate Anesthesia Transfer of Care Note  Patient: [name] [name]  Procedure(s) Performed: Procedure(s) (LRB): TOTAL KNEE ARTHROPLASTY (Right)  Patient Location: PACU  Anesthesia Type: General  Level of Consciousness: awake, patient cooperative and confused  Airway & Oxygen Therapy: Patient Spontanous Breathing and Patient connected to face mask oxygen  Post-op Assessment: Report given to PACU RN, Post -op Vital signs reviewed and stable and Patient moving all extremities  Post vital signs: Reviewed and stable  Complications: No apparent anesthesia complications

## 2011-11-29 LAB — BASIC METABOLIC PANEL
Chloride: 102 mEq/L (ref 96–112)
GFR calc Af Amer: >90 mL/min (ref >90)
GFR calc non Af Amer: >90 mL/min (ref >90)
Potassium: 3.8 mEq/L (ref 3.5–5.1)
Sodium: 137 mEq/L (ref 135–145)

## 2011-11-29 LAB — CBC
MCHC: 34.7 g/dL (ref 30.0–36.0)
Platelets: 261 10*3/uL (ref 150–400)
RDW: 12.7 % (ref 11.5–15.5)
WBC: 17.7 10*3/uL — ABNORMAL HIGH (ref 4.0–10.5)

## 2011-11-29 MED ORDER — PANTOPRAZOLE SODIUM 40 MG PO TBEC
40.0000 mg | DELAYED_RELEASE_TABLET | Freq: Every day | ORAL | Status: DC
  Administered 2011-11-29 – 2011-11-30 (×2): 40 mg via ORAL
  Filled 2011-11-29 (×3): qty 1

## 2011-11-29 NOTE — Op Note (Signed)
NAMEADDISEN, CHAPPELLE              ACCOUNT NO.:  [id number]  MEDICAL RECORD NO.:  [id number]  LOCATION:  1617                         FACILITY[facility]  [facility]  PHYSICIAN:  Georges Lynch. Avier Jech, M.D.DATE OF BIRTH:  05-11-1945  DATE OF PROCEDURE:  11/28/2011 DATE OF DISCHARGE:                              OPERATIVE REPORT   SURGEON:  Georges Lynch. Darrelyn Hillock, MD.  ASSISTANT:  Dimitri Ped, PA.  PREOP DIAGNOSIS:  Severe degenerative arthritis of the right knee with bone-on-bone and a mild flexion contracture.  POSTOP DIAGNOSIS:  Severe degenerative arthritis of the right knee with bone-on-bone and a mild flexion contracture.  The operation was a right total knee arthroplasty, utilizing DePuy system.  Note we went through careful measurements preop because of her body size.  This was extremely small.  We utilized a size 2 right femur posterior cruciate sacrificing type, a size 2 tibial tray with a 32 mm patella with 3 pegs.  The insert was a 10 mm thickness size 2 insert rotating platform type, and gentamicin was used in the cement.  All 3 components were cemented.  PROCEDURE:  Under general anesthesia, routine orthopedic prep and draping of the right lower extremity carried out.  The appropriate time- out was carried out first.  Also marked the appropriate right leg in the holding area.  At this time, she had 2 g of IV Ancef.  The leg was exsanguinated with an Esmarch, tourniquet was elevated at 300 mmHg.  The knee was placed in the in the Mayo knee holder.  The knee was flexed in an anterior approach of the knee was carried out.  Two flaps were created.  Bleeders were identified and cauterized.  At this time, I did a median parapatellar incision reflected the patella laterally, flexed the knee and then did medial and lateral meniscectomies and excised the anterior and posterior cruciate ligaments.  Thus at this time, attention was paid to the small venous bleeders.  We did cauterized those.   We then made our initial drill hole in the intercondylar notch, removed 11 mm thickness off the distal femur with a retractor.  At this time, we then measured the femur to be a size 2 we inserted next jig and then carried out our anterior-posterior chamfering cuts.  Following that, we then prepared the tibia for a size 2 tibial tray.  The appropriate amount of bone was taken from the tibial plateau as well, a drill hole was made initially in the tibia and we utilized alignment guide to remove 6 mm thickness of the affected medial side.  Following the bone cuts we then made sure we had good soft tissue release which we did.  We then went on and utilized our spacer blocks in the usual fashion and selected a 10 mm thickness tray and inserted.  We then continued our cuts and made our keel cut in the usual fashion in the tibia and a notch cut in the distal femur.  We then water picked out the knee, and inserted our trial components.  With the knee extended, the trial components in place.  I then did a resurfacing procedure on the patella. Three drill holes were made in  the patella for 32 mm patella.  I then removed all trial components, thoroughly water picked out the knee.  I injected my first preparation mixture that is the Exparel, and had a mixture of 20 mL of Exparel and 20 mL of normal saline.  Half of that was used in the soft tissue structures.  Great care was taken not to inject any vessels.  Following that, we then cemented all 3 components in simultaneously.  We waited for the cement to harden.  We removed loose pieces of cement.  Water picked the knee out again, made sure there are no loose pieces of cement.  I then inserted thrombin-soaked Gelfoam posteriorly. I then inserted my rotating platform, a size 2, 10 mm thickness. Reduced the knee, checked the stability again we had good flexion extension.  Good medial and lateral stability.  Following that, I then inserted Hemovac  drain and closed the wound in layers in usual fashion. The sterile dry dressings were applied.          ______________________________ Georges Lynch Darrelyn Hillock, M.D.     RAG/MEDQ  D:  11/28/2011  T:  11/29/2011  Job:  [id number]

## 2011-11-29 NOTE — Evaluation (Signed)
Physical Therapy Evaluation Patient Details Name: [name] [name] MRN: [id number] DOB: 14-Mar-1946 Today's Date: 11/29/2011 Time: 7829-5621 PT Time Calculation (min): 25 min  PT Assessment / Plan / Recommendation Clinical Impression  66 yo female s/p R TKA. On eval, mobility limited by pain. Recommend HHPT at discharge.    PT Assessment  Patient needs continued PT services    Follow Up Recommendations  Home health PT    Barriers to Discharge        Equipment Recommendations  Rolling walker with 5" wheels    Recommendations for Other Services OT consult   Frequency 7X/week    Precautions / Restrictions Precautions Precautions: Knee Required Braces or Orthoses: Knee Immobilizer - Right Knee Immobilizer - Right: Discontinue once straight leg raise with < 10 degree lag Restrictions Weight Bearing Restrictions: No RLE Weight Bearing: Weight bearing as tolerated   Pertinent Vitals/Pain       Mobility  Bed Mobility Bed Mobility: Supine to Sit Supine to Sit: 4: Min assist;HOB elevated;With rails Details for Bed Mobility Assistance: VCs safety, technique, hand placement. Assist for trunk to upright and R LE off bed.  Transfers Transfers: Sit to Stand;Stand to Sit Sit to Stand: 4: Min assist;With upper extremity assist;From bed Stand to Sit: With upper extremity assist;4: Min assist;To chair/3-in-1 Details for Transfer Assistance: VCs safety, technique, hand placement. Assist to rise, stabilize, control descent.  Ambulation/Gait Ambulation/Gait Assistance: 4: Min assist Ambulation Distance (Feet): 30 Feet Assistive device: Rolling walker Ambulation/Gait Assistance Details: VCs safety, technique, sequence, step length. Limited tolerance due to pain. Pt declined to ambulate further.  Gait Pattern: Step-to pattern;Antalgic;Decreased stance time - right;Decreased stride length;Decreased step length - right;Decreased step length - left    Exercises     PT Diagnosis: Difficulty  walking;Acute pain;Abnormality of gait  PT Problem List: Decreased strength;Decreased range of motion;Decreased activity tolerance;Decreased mobility;Pain;Decreased knowledge of precautions;Decreased knowledge of use of DME PT Treatment Interventions: DME instruction;Gait training;Stair training;Functional mobility training;Therapeutic activities;Therapeutic exercise;Patient/family education   PT Goals Acute Rehab PT Goals PT Goal Formulation: With patient/family Time For Goal Achievement: 12/06/11 Potential to Achieve Goals: Good Pt will go Supine/Side to Sit: with supervision PT Goal: Supine/Side to Sit - Progress: Goal set today Pt will go Sit to Supine/Side: with supervision PT Goal: Sit to Supine/Side - Progress: Goal set today Pt will go Sit to Stand: with supervision PT Goal: Sit to Stand - Progress: Goal set today Pt will Ambulate: 51 - 150 feet;with supervision;with least restrictive assistive device PT Goal: Ambulate - Progress: Goal set today Pt will Go Up / Down Stairs: 3-5 stairs;with min assist;with least restrictive assistive device (5 steps) PT Goal: Up/Down Stairs - Progress: Goal set today  Visit Information  Last PT Received On: 11/29/11 Assistance Needed: +1    Subjective Data  Subjective: "Ohhh...slowly" Patient Stated Goal: Less pain.    Prior Functioning  Home Living Lives With: Spouse Available Help at Discharge: Family Type of Home: House Home Access: Stairs to enter Secretary/administrator of Steps: 5 Entrance Stairs-Rails: Right Home Layout: Two level Alternate Level Stairs-Number of Steps: fligjht Home Adaptive Equipment: None Prior Function Level of Independence: Independent Able to Take Stairs?: Yes Communication Communication: Prefers language other than English    Cognition  Overall Cognitive Status: Appears within functional limits for tasks assessed/performed Arousal/Alertness: Awake/alert Orientation Level: Appears intact for tasks  assessed Behavior During Session: [facility] for tasks performed    Extremity/Trunk Assessment Right Lower Extremity Assessment RLE ROM/Strength/Tone: Unable to fully assess;Due to  pain Left Lower Extremity Assessment LLE ROM/Strength/Tone: WFL for tasks assessed Trunk Assessment Trunk Assessment: Normal   Balance    End of Session PT - End of Session Equipment Utilized During Treatment: Gait belt;Right knee immobilizer Activity Tolerance: Patient limited by pain Patient left: in chair;with call bell/phone within reach;with family/visitor present  GP     [name] [facility] 11/29/2011, 12:22 PM [phone]

## 2011-11-29 NOTE — Care Management Note (Unsigned)
    Page 1 of 2   11/29/2011     3:17:17 PM   CARE MANAGEMENT NOTE 11/29/2011  Patient:  [name] [name]   Account Number:  [id number]  Date Initiated:  11/29/2011  Documentation initiated by:  Colleen Can  Subjective/Objective Assessment:   DX OSTEOARTHRITIS RT KNEE; TOTAL KNEE RPLACEMNT  [facility] REFERRAL FROM DOCTOR'S OFFICE     Action/Plan:   CM SPOKE WITH PATIENT, DAUGHTER AND SPOUSE.PLANS ARE FOR PATIENT TO GO TO HOME IN [place] WHERE  DAUGHTER AND SPOUSE WILL BE CAREGIVERS. PT RW AND HHPT. REQUESTING [facility] FOR [facility] SERVICES   Anticipated DC Date:  11/30/2011   Anticipated DC Plan:  HOME W HOME HEALTH SERVICES  In-house referral  NA      DC Planning Services  CM consult      [facility] Choice  HOME HEALTH  DURABLE MEDICAL EQUIPMENT   Choice offered to / List presented to:  C-1 Patient   DME arranged  Levan Hurst      DME agency  [facility]     HH arranged  HH-2 PT      [facility] agency  [facility]   Status of service:  In process, will continue to follow Medicare Important Message given?   (If response is "NO", the following Medicare IM given date fields will be blank) Date Medicare IM given:   Date Additional Medicare IM given:    Discharge Disposition:    Per UR Regulation:    If discussed at Long Length of Stay Meetings, dates discussed:    Comments:  11/29/2011 Raynelle Bring BSN CCM [phone] HH ORDERS, OP NOTE, H&P AND FACE SHETT FAXED TO [facility]. [facility] NOTIFIED OF DME NEED.-CM WILL F/U

## 2011-11-29 NOTE — Progress Notes (Signed)
Physical Therapy Treatment Patient Details Name: [name] [name] MRN: [id number] DOB: 01/10/1946 Today's Date: 11/29/2011 Time: 1330-1406 PT Time Calculation (min): 36 min  PT Assessment / Plan / Recommendation Comments on Treatment Session  Pt's progress still limited by pain. Family present and helping with translation.     Follow Up Recommendations  Home health PT    Barriers to Discharge        Equipment Recommendations  Rolling walker with 5" wheels    Recommendations for Other Services OT consult  Frequency 7X/week   Plan Discharge plan remains appropriate    Precautions / Restrictions Precautions Precautions: Knee Required Braces or Orthoses: Knee Immobilizer - Right Knee Immobilizer - Right: Discontinue once straight leg raise with < 10 degree lag Restrictions Weight Bearing Restrictions: No RLE Weight Bearing: Weight bearing as tolerated   Pertinent Vitals/Pain     Mobility  Bed Mobility Bed Mobility: Sit to Supine Supine to Sit: 4: Min assist;HOB elevated;With rails Sit to Supine: 4: Min assist;HOB flat Details for Bed Mobility Assistance: VCs safety, technique, hand placement. Assist for R LE onto bed.  Transfers Transfers: Sit to Stand;Stand to Sit Sit to Stand: 4: Min assist;With upper extremity assist;With armrests;From chair/3-in-1 Stand to Sit: 4: Min assist;With upper extremity assist;With armrests;To chair/3-in-1 Details for Transfer Assistance: VCs safety, technique, hand placement. Assist to rise, stabilize, control descent.  Ambulation/Gait Ambulation/Gait Assistance: 4: Min assist Ambulation Distance (Feet): 35 Feet Assistive device: Rolling walker Ambulation/Gait Assistance Details: VCs safety, technique, sequence, step length. Limited tolerance due to pain. Pt declined to ambulate further.  Gait Pattern: Step-to pattern;Trunk flexed;Decreased stance time - right;Decreased stride length;Decreased step length - right;Decreased step length - left    Exercises Total Joint Exercises Ankle Circles/Pumps: AROM;Both;10 reps;Supine Quad Sets: AROM;Both;10 reps;Supine Short Arc Quad: AAROM;Right;10 reps;Supine Heel Slides: AAROM;Right;10 reps;Supine Hip ABduction/ADduction: AAROM;Right;Supine;10 reps Straight Leg Raises: AAROM;Right;10 reps;Supine   PT Diagnosis: Difficulty walking;Acute pain;Abnormality of gait  PT Problem List: Decreased strength;Decreased range of motion;Decreased activity tolerance;Decreased mobility;Pain;Decreased knowledge of precautions;Decreased knowledge of use of DME PT Treatment Interventions: DME instruction;Gait training;Stair training;Functional mobility training;Therapeutic activities;Therapeutic exercise;Patient/family education   PT Goals Acute Rehab PT Goals PT Goal Formulation: With patient/family Time For Goal Achievement: 12/06/11 Potential to Achieve Goals: Good Pt will go Supine/Side to Sit: with supervision PT Goal: Supine/Side to Sit - Progress: Goal set today Pt will go Sit to Supine/Side: with supervision PT Goal: Sit to Supine/Side - Progress: Progressing toward goal Pt will go Sit to Stand: with supervision PT Goal: Sit to Stand - Progress: Progressing toward goal Pt will Ambulate: 51 - 150 feet;with supervision;with least restrictive assistive device PT Goal: Ambulate - Progress: Progressing toward goal Pt will Go Up / Down Stairs: 3-5 stairs;with min assist;with least restrictive assistive device (5 steps) PT Goal: Up/Down Stairs - Progress: Goal set today  Visit Information  Last PT Received On: 11/29/11 Assistance Needed: +1    Subjective Data  Subjective: "Ohhhh....slowly" Patient Stated Goal: None stated   Cognition  Overall Cognitive Status: Appears within functional limits for tasks assessed/performed Arousal/Alertness: Awake/[name] Orientation Level: Appears intact for tasks assessed Behavior During Session: [facility] for tasks performed    Balance     End of Session PT - End  of Session Equipment Utilized During Treatment: Gait belt;Right knee immobilizer Activity Tolerance: Patient limited by pain Patient left: in bed;with call bell/phone within reach;with family/visitor present   GP     [name] 11/29/2011, 3:58 PM [phone]

## 2011-11-29 NOTE — Progress Notes (Signed)
Subjective: Doing fine this A.M. Hemovac was Dcd. Will ambulate today.Acute Blood Loss Anemia from Surgery.   Objective: Vital signs in last 24 hours: Temp:  [97.3 F (36.3 C)-98.6 F (37 C)] 98.6 F (37 C) (07/25 0615) Pulse Rate:  [86-117] 88  (07/25 0615) Resp:  [7-20] 16  (07/25 0615) BP: (96-145)/(64-86) 106/77 mmHg (07/25 0615) SpO2:  [97 %-100 %] 97 % (07/25 0615) Weight:  [58.968 kg (130 lb)] 58.968 kg (130 lb) (07/24 1600)  Intake/Output from previous day: 07/24 0701 - 07/25 0700 In: 2495 [P.O.:480; I.V.:1965; IV Piggyback:50] Out: 1650 [Urine:1200; Drains:375] Intake/Output this shift:     [facility] 11/29/11 0427  HGB 10.3*    Basename 11/29/11 0427  WBC 17.7*  RBC 3.37*  HCT 29.7*  PLT 261    Basename 11/29/11 0427  NA 137  K 3.8  CL 102  CO2 27  BUN 8  CREATININE 0.48*  GLUCOSE 129*  CALCIUM 8.9   No results found for this basename: LABPT:2,INR:2 in the last 72 hours  Neurologically intact Dorsiflexion/Plantar flexion intact  Assessment/Plan: PT Today and DC planning for Saturday if stable.   Aleighya Mcanelly A 11/29/2011, 7:21 AM

## 2011-11-30 LAB — CBC
Hemoglobin: 9.1 g/dL — ABNORMAL LOW (ref 12.0–15.0)
MCH: 29.9 pg (ref 26.0–34.0)
MCV: 89.1 fL (ref 78.0–100.0)
RBC: 3.04 MIL/uL — ABNORMAL LOW (ref 3.87–5.11)

## 2011-11-30 LAB — BASIC METABOLIC PANEL
BUN: 5 mg/dL — ABNORMAL LOW (ref 6–23)
CO2: 27 mEq/L (ref 19–32)
Calcium: 8.3 mg/dL — ABNORMAL LOW (ref 8.4–10.5)
Creatinine, Ser: 0.45 mg/dL — ABNORMAL LOW (ref 0.50–1.10)
Glucose, Bld: 139 mg/dL — ABNORMAL HIGH (ref 70–99)

## 2011-11-30 MED ORDER — OXYCODONE-ACETAMINOPHEN 5-325 MG PO TABS: 2.0000 | ORAL_TABLET | ORAL | Status: AC | PRN

## 2011-11-30 MED ORDER — RIVAROXABAN 10 MG PO TABS: 10.0000 mg | ORAL_TABLET | Freq: Every day | ORAL | Status: DC

## 2011-11-30 MED ORDER — METHOCARBAMOL 500 MG PO TABS: 500.0000 mg | ORAL_TABLET | Freq: Four times a day (QID) | ORAL | Status: AC | PRN

## 2011-11-30 NOTE — Progress Notes (Signed)
Physical Therapy Treatment Patient Details Name: [name] [name] MRN: [id number] DOB: Mar 26, 1946 Today's Date: 11/30/2011 Time: 1000-1021 PT Time Calculation (min): 21 min  PT Assessment / Plan / Recommendation Comments on Treatment Session  Progressing slowly. Able to tolerate activity/ambulation better this session.     Follow Up Recommendations  Home health PT    Barriers to Discharge        Equipment Recommendations  Rolling walker with 5" wheels    Recommendations for Other Services    Frequency 7X/week   Plan Discharge plan remains appropriate    Precautions / Restrictions Precautions Precautions: Knee Required Braces or Orthoses: Knee Immobilizer - Right Knee Immobilizer - Right: Discontinue once straight leg raise with < 10 degree lag Restrictions Weight Bearing Restrictions: No RLE Weight Bearing: Weight bearing as tolerated   Pertinent Vitals/Pain     Mobility  Bed Mobility Bed Mobility: Not assessed Transfers Transfers: Sit to Stand;Stand to Sit Sit to Stand: 4: Min assist;With upper extremity assist;From chair/3-in-1 Stand to Sit: To chair/3-in-1;4: Min assist;Without upper extremity assist Details for Transfer Assistance: Multimodal cues for safety, technique, hand placement. Assist to rise, stabilize, control descent Ambulation/Gait Ambulation/Gait Assistance: 4: Min assist Ambulation Distance (Feet): 75 Feet Assistive device: Rolling walker Ambulation/Gait Assistance Details: Multimodal cues for safety, sequence. Assist to stabilize throughtout ambulation.  Gait Pattern: Step-to pattern;Decreased stride length;Decreased step length - right;Decreased step length - left;Antalgic    Exercises     PT Diagnosis:    PT Problem List:   PT Treatment Interventions:     PT Goals Acute Rehab PT Goals Pt will go Sit to Stand: with supervision PT Goal: Sit to Stand - Progress: Progressing toward goal Pt will Ambulate: 51 - 150 feet;with supervision;with least  restrictive assistive device PT Goal: Ambulate - Progress: Progressing toward goal  Visit Information  Last PT Received On: 11/30/11 Assistance Needed: +1    Subjective Data  Subjective: "Okay" Patient Stated Goal: None stated   Cognition  Overall Cognitive Status: Appears within functional limits for tasks assessed/performed Arousal/Alertness: Awake/alert Orientation Level: Appears intact for tasks assessed Behavior During Session: [facility] for tasks performed    Balance     End of Session PT - End of Session Equipment Utilized During Treatment: Gait belt;Right knee immobilizer Activity Tolerance: Patient tolerated treatment well Patient left: in chair;with call bell/phone within reach;with family/visitor present   GP     [name] [facility] 11/30/2011, 11:29 AM [phone]

## 2011-11-30 NOTE — Progress Notes (Signed)
Physical Therapy Treatment Patient Details Name: [name] [name] MRN: [id number] DOB: 1945-09-13 Today's Date: 11/30/2011 Time: 3086-5784 PT Time Calculation (min): 24 min  PT Assessment / Plan / Recommendation Comments on Treatment Session  Progressing. Plan is for d/c home Sat.     Follow Up Recommendations  Home health PT    Barriers to Discharge        Equipment Recommendations  Rolling walker with 5" wheels    Recommendations for Other Services    Frequency 7X/week   Plan Discharge plan remains appropriate    Precautions / Restrictions Precautions Precautions: Knee Required Braces or Orthoses: Knee Immobilizer - Right Knee Immobilizer - Right: Discontinue once straight leg raise with < 10 degree lag Restrictions Weight Bearing Restrictions: No RLE Weight Bearing: Weight bearing as tolerated   Pertinent Vitals/Pain     Mobility  Bed Mobility Bed Mobility: Sit to Supine Sit to Supine: 4: Min assist Details for Bed Mobility Assistance: Assist for R LE onto bed.  Transfers Transfers: Stand to Sit;Sit to Stand Sit to Stand: 4: Min assist;With upper extremity assist;From chair/3-in-1 Stand to Sit: To bed;4: Min assist;With upper extremity assist Details for Transfer Assistance: Assist to rise, stabilize, control descent, slide R LE forward.  Ambulation/Gait Ambulation/Gait Assistance: 4: Min guard Ambulation Distance (Feet): 120 Feet Assistive device: Rolling walker Ambulation/Gait Assistance Details: Cues safety, sequence. Gait Pattern: Step-to pattern;Decreased stride length    Exercises Total Joint Exercises Ankle Circles/Pumps: AROM;Both;10 reps;Supine Quad Sets: AROM;Both;10 reps;Supine Short Arc Quad: AAROM;Right;10 reps;Supine Heel Slides: AAROM;Right;10 reps;Supine Hip ABduction/ADduction: AAROM;Right;10 reps;Supine Straight Leg Raises: AAROM;Right;10 reps;Supine   PT Diagnosis:    PT Problem List:   PT Treatment Interventions:     PT Goals Acute  Rehab PT Goals Pt will go Sit to Supine/Side: with supervision PT Goal: Sit to Supine/Side - Progress: Progressing toward goal Pt will go Sit to Stand: with supervision PT Goal: Sit to Stand - Progress: Progressing toward goal Pt will Ambulate: 51 - 150 feet;with supervision;with least restrictive assistive device PT Goal: Ambulate - Progress: Progressing toward goal  Visit Information  Last PT Received On: 11/30/11 Assistance Needed: +1    Subjective Data  Subjective: "Okay" Patient Stated Goal: Home tomorrow   Cognition  Overall Cognitive Status: Appears within functional limits for tasks assessed/performed Arousal/Alertness: Awake/alert Orientation Level: Appears intact for tasks assessed Behavior During Session: [facility] for tasks performed    Balance     End of Session PT - End of Session Equipment Utilized During Treatment: Gait belt;Right knee immobilizer Activity Tolerance: Patient tolerated treatment well Patient left: in bed;with call bell/phone within reach;with family/visitor present   GP     [name] [facility] 11/30/2011, 3:37 PM [phone]

## 2011-11-30 NOTE — Progress Notes (Signed)
11/30/2011 [name] [name] BSN CCM [phone] Advanced DME rep delivered rw and 3n1 TO PT'S ROOM. [facility] notified of patient's anticipated discharge on tomorrow 12/01/2011.

## 2011-11-30 NOTE — Progress Notes (Signed)
Subjective: Dressing changed and wound looks fine. No problems today.   Objective: Vital signs in last 24 hours: Temp:  [97.8 F (36.6 C)-98.7 F (37.1 C)] 98.1 F (36.7 C) (07/26 0557) Pulse Rate:  [88-112] 112  (07/26 0557) Resp:  [14-16] 16  (07/26 0557) BP: (118-135)/(73-81) 118/73 mmHg (07/26 0557) SpO2:  [94 %-98 %] 96 % (07/26 0557)  Intake/Output from previous day: 07/25 0701 - 07/26 0700 In: 2890 [P.O.:720; I.V.:2120; IV Piggyback:50] Out: 700 [Urine:700] Intake/Output this shift:     Basename 11/30/11 0410 11/29/11 0427  HGB 9.1* 10.3*    Basename 11/30/11 0410 11/29/11 0427  WBC 13.3* 17.7*  RBC 3.04* 3.37*  HCT 27.1* 29.7*  PLT 228 261    Basename 11/30/11 0410 11/29/11 0427  NA 132* 137  K 3.4* 3.8  CL 98 102  CO2 27 27  BUN 5* 8  CREATININE 0.45* 0.48*  GLUCOSE 139* 129*  CALCIUM 8.3* 8.9   No results found for this basename: LABPT:2,INR:2 in the last 72 hours  No cellulitis present  Assessment/Plan: Will DC tomorrow.   Jalise Zawistowski A 11/30/2011, 7:18 AM

## 2011-11-30 NOTE — Evaluation (Signed)
Occupational Therapy Evaluation Patient Details Name: [name] [name] MRN: [id number] DOB: [birth year] Today's Date: 11/30/2011 Time: 4098-1191 OT Time Calculation (min): 18 min  OT Assessment / Plan / Recommendation Clinical Impression  Pt doing well POD 2 RTKR. Skilled OT indicated to maximize independence with BADLs to supervision level in prep for d/c home.     OT Assessment  Patient needs continued OT Services    Follow Up Recommendations  No OT follow up    Barriers to Discharge      Equipment Recommendations  3 in 1 bedside comode    Recommendations for Other Services    Frequency  Min 2X/week    Precautions / Restrictions Precautions Precautions: Knee Required Braces or Orthoses: Knee Immobilizer - Right Knee Immobilizer - Right: Discontinue once straight leg raise with < 10 degree lag Restrictions Weight Bearing Restrictions: No RLE Weight Bearing: Weight bearing as tolerated   Pertinent Vitals/Pain Reported 5/10 pain. Pt was given ice and repositioned    ADL  Grooming: Performed;Wash/dry hands;Min guard Where Assessed - Grooming: Supported standing Lower Body Bathing: Simulated;Minimal assistance Where Assessed - Lower Body Bathing: Supported sit to stand Lower Body Dressing: Simulated;Moderate assistance Where Assessed - Lower Body Dressing: Supported sit to Pharmacist, hospital: Performed;Minimal Dentist Method: Sit to Barista: Raised toilet seat with arms (or 3-in-1 over toilet) Toileting - Clothing Manipulation and Hygiene: Performed;Min guard Where Assessed - Glass blower/designer Manipulation and Hygiene: Sit to stand from 3-in-1 or toilet Equipment Used: Rolling walker;Knee Immobilizer Transfers/Ambulation Related to ADLs: Pt ambulated to the bathroom with minguard A and min cues for posture and step sequence. ADL Comments: Husband very attentive and will be able to provide necessary A.    OT Diagnosis:  Generalized weakness  OT Problem List: Decreased activity tolerance;Decreased knowledge of use of DME or AE;Pain OT Treatment Interventions: Self-care/ADL training;Therapeutic activities;DME and/or AE instruction;Patient/family education   OT Goals Acute Rehab OT Goals OT Goal Formulation: With patient/family Time For Goal Achievement: 12/07/11 Potential to Achieve Goals: Good ADL Goals Pt Will Perform Grooming: with supervision;Standing at sink ADL Goal: Grooming - Progress: Goal set today Pt Will Perform Lower Body Bathing: with supervision;Sit to stand from chair;Sit to stand from bed ADL Goal: Lower Body Bathing - Progress: Goal set today Pt Will Perform Lower Body Dressing: with supervision;Sit to stand from bed;Sit to stand from chair ADL Goal: Lower Body Dressing - Progress: Goal set today Pt Will Transfer to Toilet: with supervision;3-in-1;Comfort height toilet ADL Goal: Toilet Transfer - Progress: Goal set today Pt Will Perform Toileting - Clothing Manipulation: with supervision;Standing ADL Goal: Toileting - Clothing Manipulation - Progress: Goal set today Pt Will Perform Toileting - Hygiene: with supervision;Sit to stand from 3-in-1/toilet ADL Goal: Toileting - Hygiene - Progress: Goal set today Pt Will Perform Tub/Shower Transfer: with supervision;Ambulation;Shower transfer ADL Goal: Web designer - Progress: Goal set today  Visit Information  Last OT Received On: 11/30/11 Assistance Needed: +1    Subjective Data  Subjective: "Ouch!" Pt speaks little to no english. husband interpreted. Patient Stated Goal: Not asked   Prior Functioning  Vision/Perception  Home Living Lives With: Spouse Available Help at Discharge: Family Type of Home: House Home Access: Stairs to enter Secretary/administrator of Steps: 5 Entrance Stairs-Rails: Right Home Layout: Two level Alternate Level Stairs-Number of Steps: flight Bathroom Shower/Tub: Health visitor:  Standard Home Adaptive Equipment: None Prior Function Level of Independence: Independent Able to Take Stairs?: Yes Driving: No Vocation:  Unemployed Communication Communication: Prefers language other than English Dominant Hand: Right      Cognition  Overall Cognitive Status: Appears within functional limits for tasks assessed/performed Arousal/Alertness: Awake/alert Orientation Level: Appears intact for tasks assessed Behavior During Session: [facility] for tasks performed    Extremity/Trunk Assessment Right Upper Extremity Assessment RUE ROM/Strength/Tone: [facility] for tasks assessed Left Upper Extremity Assessment LUE ROM/Strength/Tone: WFL for tasks assessed   Mobility Transfers Sit to Stand: 4: Min assist;With upper extremity assist;With armrests;From chair/3-in-1 Stand to Sit: 4: Min assist;With upper extremity assist;With armrests;To chair/3-in-1 Details for Transfer Assistance: Min VCs for hand placement and RLE management.   Exercise    Balance    End of Session OT - End of Session Activity Tolerance: Patient tolerated treatment well Patient left: in chair;with call bell/phone within reach;with family/visitor present  GO     Kamaal Cast A OTR/L [phone] 11/30/2011, 10:17 AM

## 2011-12-01 LAB — CBC
MCH: 31.1 pg (ref 26.0–34.0)
MCV: 88.7 fL (ref 78.0–100.0)
Platelets: 241 10*3/uL (ref 150–400)
RDW: 13 % (ref 11.5–15.5)

## 2011-12-01 MED ORDER — LORATADINE 10 MG PO TABS
10.0000 mg | ORAL_TABLET | Freq: Every day | ORAL | Status: DC
  Filled 2011-12-01: qty 1

## 2011-12-01 NOTE — Progress Notes (Signed)
Occupational Therapy Treatment Patient Details Name: [name] [name] MRN: [id number] DOB: 1946-04-23 Today's Date: 12/01/2011 Time: 7829-5621 OT Time Calculation (min): 28 min  OT Assessment / Plan / Recommendation Comments on Treatment Session This 66 yo s/p RTKA making progress and has A prn, all education completed.    Follow Up Recommendations  No OT follow up    Barriers to Discharge       Equipment Recommendations  3 in 1 bedside comode    Recommendations for Other Services    Frequency Min 2X/week   Plan Discharge plan remains appropriate    Precautions / Restrictions Precautions Precautions: Knee Required Braces or Orthoses: Knee Immobilizer - Right Knee Immobilizer - Right: Discontinue once straight leg raise with < 10 degree lag Restrictions Weight Bearing Restrictions: No RLE Weight Bearing: Weight bearing as tolerated   Pertinent Vitals/Pain 5/10 R knee    ADL  Lower Body Dressing: Performed;Moderate assistance Where Assessed - Lower Body Dressing: Supported sit to stand Toilet Transfer: Performed;Min Pension scheme manager Method: Sit to Barista: Bedside commode (over toilet) Toileting - Clothing Manipulation and Hygiene: Performed;Min guard Where Assessed - Engineer, mining and Hygiene: Standing Tub/Shower Transfer: Performed;Minimal Radiation protection practitioner Method: Science writer:  (3-n-1 in shower stall) Equipment Used: Knee Immobilizer;Rolling walker Transfers/Ambulation Related to ADLs: Min guard A ADL Comments: Husband and other family available to A prn    OT Diagnosis:    OT Problem List:   OT Treatment Interventions:     OT Goals ADL Goals ADL Goal: Lower Body Dressing - Progress: Not progressing (due to pain) ADL Goal: Toilet Transfer - Progress: Progressing toward goals ADL Goal: Toileting - Clothing Manipulation - Progress: Progressing toward goals ADL Goal: Toileting -  Hygiene - Progress: Progressing toward goals ADL Goal: Tub/Shower Transfer - Progress: Progressing toward goals  Visit Information  Last OT Received On: 12/01/11 Assistance Needed: +1    Subjective Data      Prior Functioning       Cognition  Overall Cognitive Status: Appears within functional limits for tasks assessed/performed Arousal/Alertness: Awake/alert Orientation Level: Appears intact for tasks assessed Behavior During Session: [facility] for tasks performed    Mobility Bed Mobility Details for Bed Mobility Assistance: Assist for R LE onto bed.  Transfers Transfers: Sit to Stand;Stand to Sit Sit to Stand: 4: Min guard;With upper extremity assist;From chair/3-in-1;With armrests Stand to Sit: 4: Min guard;With upper extremity assist;With armrests;To chair/3-in-1 Details for Transfer Assistance: Assist to rise, stabilize, control descent, slide R LE forward.    Exercises Total Joint Exercises Heel Slides: AAROM;Right;Supine;15 reps Hip ABduction/ADduction: AAROM;Right;Supine;15 reps Straight Leg Raises: AAROM;Right;Supine;15 reps  Balance    End of Session OT - End of Session Activity Tolerance: Patient tolerated treatment well Patient left:  (in W/C ready to D/C)       Evette Georges [phone] 12/01/2011, 11:39 AM

## 2011-12-01 NOTE — Progress Notes (Signed)
Physical Therapy Treatment Patient Details Name: [name] [name] MRN: [id number] DOB: 09/02/1945 Today's Date: 12/01/2011 Time: 5621-3086 PT Time Calculation (min): 25 min  PT Assessment / Plan / Recommendation Comments on Treatment Session  Stair training completed. Ready to DC home from PT standpoint.    Follow Up Recommendations  Home health PT    Barriers to Discharge        Equipment Recommendations  Rolling walker with 5" wheels    Recommendations for Other Services    Frequency 7X/week   Plan Discharge plan remains appropriate    Precautions / Restrictions Precautions Precautions: Knee Required Braces or Orthoses: Knee Immobilizer - Right Knee Immobilizer - Right: Discontinue once straight leg raise with < 10 degree lag Restrictions Weight Bearing Restrictions: No RLE Weight Bearing: Weight bearing as tolerated   Pertinent Vitals/Pain *4-5/10 R knee Ice applied, premedicated**    Mobility  Bed Mobility Details for Bed Mobility Assistance: Assist for R LE onto bed.  Transfers Transfers: Stand to Sit;Sit to Stand Sit to Stand: 4: Min assist;With upper extremity assist;From chair/3-in-1 Stand to Sit: To bed;4: Min assist;With upper extremity assist Details for Transfer Assistance: Assist to rise, stabilize, control descent, slide R LE forward.  Ambulation/Gait Ambulation/Gait Assistance: 5: Supervision Ambulation Distance (Feet): 40 Feet Assistive device: Rolling walker Gait Pattern: Step-to pattern;Decreased stride length Stairs: Yes Stairs Assistance: 4: Min assist Stairs Assistance Details (indicate cue type and reason): min A for balance, VCs sequencing, husband present for stair training Stair Management Technique: One rail Right;With crutches Number of Stairs: 5     Exercises Total Joint Exercises Heel Slides: AAROM;Right;Supine;15 reps Hip ABduction/ADduction: AAROM;Right;Supine;15 reps Straight Leg Raises: AAROM;Right;Supine;15 reps   PT Diagnosis:     PT Problem List:   PT Treatment Interventions:     PT Goals Acute Rehab PT Goals Pt will go Sit to Supine/Side: with supervision PT Goal: Sit to Supine/Side - Progress: Progressing toward goal Pt will go Sit to Stand: with supervision PT Goal: Sit to Stand - Progress: Progressing toward goal Pt will Ambulate: 51 - 150 feet;with supervision;with least restrictive assistive device PT Goal: Ambulate - Progress: Progressing toward goal Pt will Go Up / Down Stairs: 3-5 stairs;with min assist;with rail(s) PT Goal: Up/Down Stairs - Progress: Met  Visit Information  Last PT Received On: 12/01/11 Assistance Needed: +1    Subjective Data  Subjective: It's a 4 to 5. (pain level) Patient Stated Goal: go home   Cognition  Overall Cognitive Status: Appears within functional limits for tasks assessed/performed Arousal/Alertness: Awake/alert Orientation Level: Appears intact for tasks assessed Behavior During Session: [facility] for tasks performed    Balance     End of Session PT - End of Session Equipment Utilized During Treatment: Gait belt;Right knee immobilizer Activity Tolerance: Patient tolerated treatment well Patient left: with call bell/phone within reach;with family/visitor present;in chair Nurse Communication: Mobility status   GP     Ralene Bathe Kistler 12/01/2011, 10:20 AM [phone]

## 2011-12-01 NOTE — Progress Notes (Signed)
Pt discharged to family auto via w/c. Assessment unchanged from am. 

## 2011-12-01 NOTE — Progress Notes (Signed)
Subjective: 3 Days Post-Op Procedure(s) (LRB): TOTAL KNEE ARTHROPLASTY (Right) Patient reports pain as moderate.  Complaining of itching feels that it is due to laundry detergent as she has had this problem in past. No SOB, Chest pain or dizziness. Wanting to go home  Objective: Vital signs in last 24 hours: Temp:  [98 F (36.7 C)-98.5 F (36.9 C)] 98.1 F (36.7 C) (07/27 0634) Pulse Rate:  [107-113] 110  (07/27 0634) Resp:  [14-16] 14  (07/27 0634) BP: (129)/(77-86) 129/86 mmHg (07/27 0634) SpO2:  [96 %-98 %] 96 % (07/27 0634)  Intake/Output from previous day: 07/26 0701 - 07/27 0700 In: 600 [P.O.:600] Out: 600 [Urine:600] Intake/Output this shift: Total I/O In: 240 [P.O.:240] Out: -    Basename 12/01/11 0432 11/30/11 0410 11/29/11 0427  HGB 9.4* 9.1* 10.3*    Basename 12/01/11 0432 11/30/11 0410  WBC 14.3* 13.3*  RBC 3.02* 3.04*  HCT 26.8* 27.1*  PLT 241 228    Basename 11/30/11 0410 11/29/11 0427  NA 132* 137  K 3.4* 3.8  CL 98 102  CO2 27 27  BUN 5* 8  CREATININE 0.45* 0.48*  GLUCOSE 139* 129*  CALCIUM 8.3* 8.9   No results found for this basename: LABPT:2,INR:2 in the last 72 hours  Neurovascular intact Sensation intact distally Dorsiflexion/Plantar flexion intact Incision: scant drainage Compartment soft Right  knee wound benign and well approximated Assessment/Plan: 3 Days Post-Op Procedure(s) (LRB): TOTAL KNEE ARTHROPLASTY (Right) Discharge home with home health Zyrtec for itching family will pick-up for home use  Richardean Canal 12/01/2011, 10:46 AM

## 2011-12-05 ENCOUNTER — Encounter (HOSPITAL_COMMUNITY): Payer: Self-pay | Admitting: Orthopedic Surgery

## 2011-12-05 NOTE — Discharge Summary (Signed)
Physician Discharge Summary   Patient ID: [name] [name] MRN: [id number] DOB/AGE: [birth year] 66 y.o.  Admit date: 11/28/2011 Discharge date: 12/01/2011  Primary Diagnosis:  Right knee osteoarthritis  Admission Diagnoses:  Past Medical History  Diagnosis Date  . Hypothyroidism   . Headache     WHEN REFLUX SEVERE  . GERD (gastroesophageal reflux disease)     PT STATES SEVERE REFLUX IF SHE DOES NOT TAKE HER OMEPRAZOLE  . Arthritis     OA AND PAIN RIGHT KNEE  . Vegetarian diet    Discharge Diagnoses:   Right knee osteoarthritis S/P right total knee arthroplasty  Procedure:  Procedure(s) (LRB): TOTAL KNEE ARTHROPLASTY (Right)   Consults: None  HPI: The patient is a 66 year old female. Ms. Doring started having pain in the right knee at the end of 2012. She did not injure the knee that she was aware of. She has pain with weightbearing and at times has trouble with instability in the knee. She has had cortisone injections in the knee with limited improvement in her symptoms. Due to failure of conservative measures, the most predictable means for increased function and decreased pain in the right knee is a right total knee arthroplasty.      Laboratory Data: Hospital Outpatient Visit on 11/22/2011  Component Date Value Range Status  . MRSA, PCR 11/22/2011 NEGATIVE  NEGATIVE Final  . Staphylococcus aureus 11/22/2011 NEGATIVE  NEGATIVE Final   Comment:                                 The Xpert SA Assay (FDA                          approved for NASAL specimens                          only), is one component of                          a comprehensive surveillance                          program.  It is not intended                          to diagnose infection nor to                          guide or monitor treatment.  Marland Kitchen aPTT 11/22/2011 31  24 - 37 seconds Final  . WBC 11/22/2011 9.0  4.0 - 10.5 K/uL Final  . RBC 11/22/2011 4.52  3.87 - 5.11 MIL/uL Final  . Hemoglobin  11/22/2011 13.7  12.0 - 15.0 g/dL Final  . HCT 91/47/8295 40.5  36.0 - 46.0 % Final  . MCV 11/22/2011 89.6  78.0 - 100.0 fL Final  . MCH 11/22/2011 30.3  26.0 - 34.0 pg Final  . MCHC 11/22/2011 33.8  30.0 - 36.0 g/dL Final  . RDW 62/13/0865 12.6  11.5 - 15.5 % Final  . Platelets 11/22/2011 312  150 - 400 K/uL Final  . Sodium 11/22/2011 140  135 - 145 mEq/L Final  . Potassium 11/22/2011 4.3  3.5 - 5.1 mEq/L Final  . Chloride 11/22/2011 102  96 - 112 mEq/L Final  . CO2 11/22/2011 27  19 - 32 mEq/L Final  . Glucose, Bld 11/22/2011 105* 70 - 99 mg/dL Final  . BUN 16/02/9603 8  6 - 23 mg/dL Final  . Creatinine, Ser 11/22/2011 0.57  0.50 - 1.10 mg/dL Final  . Calcium 54/01/8118 10.1  8.4 - 10.5 mg/dL Final  . Total Protein 11/22/2011 7.6  6.0 - 8.3 g/dL Final  . Albumin 14/78/2956 4.2  3.5 - 5.2 g/dL Final  . AST 21/30/8657 29  0 - 37 U/L Final  . ALT 11/22/2011 38* 0 - 35 U/L Final  . Alkaline Phosphatase 11/22/2011 75  39 - 117 U/L Final  . Total Bilirubin 11/22/2011 0.5  0.3 - 1.2 mg/dL Final  . GFR calc non Af Amer 11/22/2011 >90  >90 mL/min Final  . GFR calc Af Amer 11/22/2011 >90  >90 mL/min Final   Comment:                                 The eGFR has been calculated                          using the CKD EPI equation.                          This calculation has not been                          validated in all clinical                          situations.                          eGFR's persistently                          <90 mL/min signify                          possible Chronic Kidney Disease.  Marland Kitchen Neutrophils Relative 11/22/2011 57  43 - 77 % Final  . Neutro Abs 11/22/2011 5.2  1.7 - 7.7 K/uL Final  . Lymphocytes Relative 11/22/2011 31  12 - 46 % Final  . Lymphs Abs 11/22/2011 2.8  0.7 - 4.0 K/uL Final  . Monocytes Relative 11/22/2011 10  3 - 12 % Final  . Monocytes Absolute 11/22/2011 0.9  0.1 - 1.0 K/uL Final  . Eosinophils Relative 11/22/2011 1  0 - 5 % Final  .  Eosinophils Absolute 11/22/2011 0.1  0.0 - 0.7 K/uL Final  . Basophils Relative 11/22/2011 1  0 - 1 % Final  . Basophils Absolute 11/22/2011 0.1  0.0 - 0.1 K/uL Final  . Prothrombin Time 11/22/2011 13.1  11.6 - 15.2 seconds Final  . INR 11/22/2011 0.97  0.00 - 1.49 Final  . Color, Urine 11/22/2011 YELLOW  YELLOW Final  . APPearance 11/22/2011 CLEAR  CLEAR Final  . Specific Gravity, Urine 11/22/2011 1.009  1.005 - 1.030 Final  . pH 11/22/2011 7.0  5.0 - 8.0 Final  . Glucose, UA 11/22/2011 NEGATIVE  NEGATIVE mg/dL Final  . Hgb urine dipstick 11/22/2011 TRACE* NEGATIVE Final  . Bilirubin Urine 11/22/2011 NEGATIVE  NEGATIVE Final  . Ketones, ur 11/22/2011 NEGATIVE  NEGATIVE mg/dL Final  . Protein, ur 98/03/9146 NEGATIVE  NEGATIVE mg/dL Final  . Urobilinogen, UA 11/22/2011 0.2  0.0 - 1.0 mg/dL Final  . Nitrite 82/95/6213 NEGATIVE  NEGATIVE Final  . Leukocytes, UA 11/22/2011 NEGATIVE  NEGATIVE Final  . Squamous Epithelial / LPF 11/22/2011 RARE  RARE Final  . RBC / HPF 11/22/2011 0-2  <3 RBC/hpf Final    X-Rays:X-ray Knee Right Port  11/28/2011  *RADIOLOGY REPORT*  Clinical Data: Right knee replacement.  PORTABLE RIGHT KNEE - 1-2 VIEW  Comparison: None.  Findings: Right total knee arthroplasty.  Surgical drain good position.  No adverse features.  IMPRESSION: As above.  Original Report Authenticated By: Elsie Stain, M.D.    Hospital Course: Patient was admitted to [facility] and taken to the OR and underwent the above state procedure without complications.  Patient tolerated the procedure well and was later transferred to the recovery room and then to the orthopaedic floor for postoperative care.  They were given PO and IV analgesics for pain control following their surgery.  They were given 24 hours of postoperative antibiotics and started on DVT prophylaxis in the form of Xarelto and SCDs.   PT and OT were ordered for total joint protocol.  Discharge planning consulted to help with  postop disposition and equipment needs.  Patient had no complications on the evening of surgery and started to get up OOB with therapy on day one.  PCA was discontinued and they were weaned over to PO meds.  Hemovac drain was pulled without difficulty.  Continued to work with therapy into day two.  Dressing was changed on day two and the incision was clean with scant bloody drainage from the hemovac site.  By day three, the patient had progressed with therapy and meeting their goals.  Incision was healing well.  Patient was seen in rounds and was ready to go home.  Discharge Medications: Prior to Admission medications   Medication Sig Start Date End Date Taking? Authorizing Provider  levothyroxine (SYNTHROID, LEVOTHROID) 50 MCG tablet Take 50 mcg by mouth daily before breakfast.   Yes Historical Provider, MD  omeprazole (PRILOSEC) 20 MG capsule Take 20 mg by mouth every morning.   Yes Historical Provider, MD  alendronate (FOSAMAX) 70 MG tablet Take 70 mg by mouth every 7 (seven) days. On THURSDAY. Take with a full glass of water on an empty stomach.    Historical Provider, MD  methocarbamol (ROBAXIN) 500 MG tablet Take 1 tablet (500 mg total) by mouth every 6 (six) hours as needed. 11/30/11 12/10/11  Meliyah Simon Tamala Ser, PA  oxyCODONE-acetaminophen (PERCOCET/ROXICET) 5-325 MG per tablet Take 2 tablets by mouth every 4 (four) hours as needed (Q4-6 hours PRN). 11/30/11 12/10/11  Shawnita Krizek Tamala Ser, PA  rivaroxaban (XARELTO) 10 MG TABS tablet Take 1 tablet (10 mg total) by mouth daily with breakfast. 11/30/11   Jveon Pound Tamala Ser, PA  simvastatin (ZOCOR) 40 MG tablet Take 40 mg by mouth every evening.    Historical Provider, MD    Diet: Regular diet (Vegetarian) Activity:WBAT Follow-up:in 2 weeks Disposition - Home Discharged Condition: fair   Discharge Orders    Future Orders Please Complete By Expires   Diet - low sodium heart healthy      Call MD / Call 911      Comments:   If you  experience chest pain or shortness of breath, CALL 911 and be transported to the  hospital emergency room.  If you develope a fever above 101 F, pus (white drainage) or increased drainage or redness at the wound, or calf pain, call your surgeon's office.   Constipation Prevention      Comments:   Drink plenty of fluids.  Prune juice may be helpful.  You may use a stool softener, such as Colace (over the counter) 100 mg twice a day.  Use MiraLax (over the counter) for constipation as needed.   Increase activity slowly as tolerated      Discharge instructions      Comments:   Walk with your walker. Weight bearing as instructed. Home Health Agency will follow you at home for your therapy Change your dressing daily. Shower only, no tub bath. Call if any temperatures greater than 101 or any wound complications: [phone] during the day and ask for Dr. Jeannetta Ellis nurse, [facility].   Driving restrictions      Comments:   No driving   Do not put a pillow under the knee. Place it under the heel.      Patient may shower      Comments:   You may shower without a dressing Saturday  Do not wash over the wound.  Cover wound with plastic wrap and then shower for the first three days home     Medication List  As of 12/05/2011 12:43 PM   STOP taking these medications         aspirin 81 MG chewable tablet      CALCIUM 500 PO         TAKE these medications         alendronate 70 MG tablet   Commonly known as: FOSAMAX   Take 70 mg by mouth every 7 (seven) days. On THURSDAY. Take with a full glass of water on an empty stomach.      levothyroxine 50 MCG tablet   Commonly known as: SYNTHROID, LEVOTHROID   Take 50 mcg by mouth daily before breakfast.      methocarbamol 500 MG tablet   Commonly known as: ROBAXIN   Take 1 tablet (500 mg total) by mouth every 6 (six) hours as needed.      omeprazole 20 MG capsule   Commonly known as: PRILOSEC   Take 20 mg by mouth every morning.       oxyCODONE-acetaminophen 5-325 MG per tablet   Commonly known as: PERCOCET/ROXICET   Take 2 tablets by mouth every 4 (four) hours as needed (Q4-6 hours PRN).      rivaroxaban 10 MG Tabs tablet   Commonly known as: XARELTO   Take 1 tablet (10 mg total) by mouth daily with breakfast.      simvastatin 40 MG tablet   Commonly known as: ZOCOR   Take 40 mg by mouth every evening.             SignedCeledonio Savage, Younes Degeorge LAUREN 12/05/2011, 12:43 PM

## 2012-10-27 ENCOUNTER — Encounter: Payer: Self-pay | Admitting: *Deleted

## 2012-10-29 ENCOUNTER — Encounter: Payer: Self-pay | Admitting: Internal Medicine

## 2012-10-29 ENCOUNTER — Ambulatory Visit (INDEPENDENT_AMBULATORY_CARE_PROVIDER_SITE_OTHER): Payer: Medicare Other | Admitting: Internal Medicine

## 2012-10-29 VITALS — BP 108/68 | HR 96 | Temp 98.4°F | Resp 14 | Ht <= 58 in | Wt 121.0 lb

## 2012-10-29 DIAGNOSIS — IMO0001 Reserved for inherently not codable concepts without codable children: Secondary | ICD-10-CM

## 2012-10-29 DIAGNOSIS — E039 Hypothyroidism, unspecified: Secondary | ICD-10-CM

## 2012-10-29 DIAGNOSIS — K219 Gastro-esophageal reflux disease without esophagitis: Secondary | ICD-10-CM

## 2012-10-29 DIAGNOSIS — R05 Cough: Secondary | ICD-10-CM

## 2012-10-29 DIAGNOSIS — M810 Age-related osteoporosis without current pathological fracture: Secondary | ICD-10-CM | POA: Insufficient documentation

## 2012-10-29 DIAGNOSIS — E785 Hyperlipidemia, unspecified: Secondary | ICD-10-CM

## 2012-10-29 DIAGNOSIS — E559 Vitamin D deficiency, unspecified: Secondary | ICD-10-CM | POA: Insufficient documentation

## 2012-10-29 MED ORDER — FEXOFENADINE HCL 30 MG PO TBDP: 30.0000 mg | ORAL_TABLET | Freq: Every day | ORAL | Status: DC

## 2012-10-29 NOTE — Patient Instructions (Signed)
Get your blood work done  If your cough does not improve with losenges and allegra, please notify us

## 2012-10-29 NOTE — Progress Notes (Signed)
Patient ID: [name] [name], female   DOB: 06-Mar-1946, 67 y.o.   MRN: [id number]  Chief Complaint  Patient presents with  . Medical Managment of Chronic Issues    cough    HPI-  67 y/o female patient is here for routine follow up. She is back from her trip to [place]. Her pain in her leg is well controlled. She has been having cough with white phlegm and scratchy throat since her return from her trip bothering her anytime of the day. No sore throat or other uri symptoms Complaints of muscle aches and easy fatigue. Her workup for anemia and electrolyte abnormality have been negative in past She has thyroid problem and is compliant with her medication. No other complaints See ros  Review of Systems  Constitutional: Negative for fever, chills, weight loss, malaise/fatigue and diaphoresis.  HENT: Negative for hearing loss, ear pain, nosebleeds, congestion, sore throat, neck pain, tinnitus and ear discharge.   Eyes: Negative for blurred vision, double vision, photophobia, pain, discharge and redness.  Respiratory: Positive for cough and sputum production. Negative for hemoptysis, shortness of breath, wheezing and stridor.   Cardiovascular: Negative for chest pain, palpitations, orthopnea, claudication, leg swelling and PND.  Gastrointestinal: Negative for heartburn, nausea, vomiting, abdominal pain, diarrhea, constipation and blood in stool.  Genitourinary: Negative for dysuria, urgency, frequency, hematuria and flank pain.  Musculoskeletal: Positive for myalgias. Negative for back pain, joint pain and falls.  Skin: Negative for itching and rash.  Neurological: Negative for dizziness, tingling, tremors, sensory change, speech change, focal weakness, seizures, loss of consciousness, weakness and headaches.  Endo/Heme/Allergies: Does not bruise/bleed easily.  Psychiatric/Behavioral: Negative for depression, suicidal ideas, hallucinations, memory loss and substance abuse. The patient is not  nervous/anxious and does not have insomnia.    No Known Allergies  Past Medical History  Diagnosis Date  . Hypothyroidism   . Headache(784.0)     WHEN REFLUX SEVERE  . GERD (gastroesophageal reflux disease)     PT STATES SEVERE REFLUX IF SHE DOES NOT TAKE HER OMEPRAZOLE  . Arthritis     OA AND PAIN RIGHT KNEE  . Vegetarian diet   . Osteoarthrosis, unspecified whether generalized or localized, lower leg   . Degeneration of intervertebral disc, site unspecified   . Other specified pre-operative examination 11/07/2011  . Unspecified vitamin D deficiency   . Thoracic or lumbosacral neuritis or radiculitis, unspecified   . Encounter for long-term (current) use of other medications   . Long term (current) use of anticoagulants   . Osteoporosis, unspecified   . Disorder of bone and cartilage, unspecified   . Acute bronchiolitis due to respiratory syncytial virus (RSV)   . Acute bronchiolitis due to other infectious organisms   . Other atopic dermatitis and related conditions   . Other malaise and fatigue   . Disturbance of skin sensation   . Other and unspecified hyperlipidemia    BP 108/68  Pulse 96  Temp(Src) 98.4 F (36.9 C) (Oral)  Resp 14  Ht 4\' 10"  (1.473 m)  Wt 121 lb (54.885 kg)  BMI 25.3 kg/m2  SpO2 99%  Physical Exam  Constitutional: She is oriented to person, place, and time. She appears well-developed and well-nourished. No distress.  HENT:  Head: Normocephalic and atraumatic.  Right Ear: External ear normal.  Left Ear: External ear normal.  Nose: Nose normal.  Mouth/Throat: Oropharynx is clear and moist.  Oropharyngeal erythema present  Eyes: Conjunctivae and EOM are normal. Pupils are equal, round, and reactive to  light.  Neck: Normal range of motion. No thyromegaly present.  Cardiovascular: Normal rate, regular rhythm, normal heart sounds and intact distal pulses.   No murmur heard. Pulmonary/Chest: Effort normal and breath sounds normal. She has no  wheezes.  Abdominal: Soft. Bowel sounds are normal.  Musculoskeletal: Normal range of motion.  Lymphadenopathy:    She has no cervical adenopathy.  Neurological: She is alert and oriented to person, place, and time.  Skin: Skin is warm and dry. She is not diaphoretic.  Psychiatric: She has a normal mood and affect. Her behavior is normal. Thought content normal.   LABS-      06/06/2009  CMP: glucose 90, BUN 9, Creatinine 0.58 Lipid: Cholesterol 161, Triglycerides 139, HDL 57, LDL 76 TSH: 0.114 05/25/1476  TSH: 1.130 11/15/2009  Vitamin D: 32.4 07/20/2010 wbc 9.2, hb 11.7, plt 352 01/24/2011  BMP: glucose 98, BUN 14, Creatinine 0.63 Lipid: Cholesterol 186, Triglycerides 130, HDL 63, LDL 97 TSH: 2.460 Vitamin D: 21.7 11/07/11 ekg- nsr, normsal axis, no st-t wave changes 11/07/2011  CBC: wbc 10.1, rbc 4.31, Hemoglobin 12.9   ASSESSMENT/PLAN-  GERD- continue ppi for now  Cough- likely allergic reaction, will treat supportively with losenges and allegra for now and reassess  Hypothyroidism- recheck tsh. Continue current levothyroxine regimen  Osteoporosis- continue fosamax and ca-vit d supplement  Myalgia and myositis- rule out worsening of hypothyroidism. Also check CK to assess for muscle damage from use of statin. Also rule out dm by checking a1c  Hyperlipidemia- continue current dose of statin and check flp and ck. Adjust dose if needed. Check a1c to rule out DM given her age and other risk factors  Check routine cbc, cmp, flp, tsh and also a1c

## 2012-11-11 ENCOUNTER — Other Ambulatory Visit: Payer: Medicare Other

## 2012-11-11 DIAGNOSIS — E039 Hypothyroidism, unspecified: Secondary | ICD-10-CM

## 2012-11-11 DIAGNOSIS — IMO0001 Reserved for inherently not codable concepts without codable children: Secondary | ICD-10-CM

## 2012-11-11 DIAGNOSIS — E785 Hyperlipidemia, unspecified: Secondary | ICD-10-CM

## 2012-11-12 LAB — CBC WITH DIFFERENTIAL/PLATELET
Basophils Absolute: 0.1 10*3/uL (ref 0.0–0.2)
Eosinophils Absolute: 0.1 10*3/uL (ref 0.0–0.4)
Immature Granulocytes: 0 % (ref 0–2)
Lymphocytes Absolute: 2.8 10*3/uL (ref 0.7–3.1)
Lymphs: 35 % (ref 14–46)
MCH: 27.6 pg (ref 26.6–33.0)
MCHC: 33 g/dL (ref 31.5–35.7)
Monocytes: 10 % (ref 4–12)
RDW: 14.4 % (ref 12.3–15.4)

## 2012-11-12 LAB — COMPREHENSIVE METABOLIC PANEL
Albumin: 4.1 g/dL (ref 3.6–4.8)
BUN: 10 mg/dL (ref 8–27)
CO2: 24 mmol/L (ref 18–29)
Calcium: 9.4 mg/dL (ref 8.6–10.2)
Creatinine, Ser: 0.55 mg/dL — ABNORMAL LOW (ref 0.57–1.00)
Globulin, Total: 2.5 g/dL (ref 1.5–4.5)
Glucose: 99 mg/dL (ref 65–99)
Total Protein: 6.6 g/dL (ref 6.0–8.5)

## 2012-11-12 LAB — TSH: TSH: 3.76 u[IU]/mL (ref 0.450–4.500)

## 2012-11-12 LAB — HEMOGLOBIN A1C
Est. average glucose Bld gHb Est-mCnc: 134 mg/dL
Hgb A1c MFr Bld: 6.3 % — ABNORMAL HIGH (ref 4.8–5.6)

## 2012-11-12 LAB — LIPID PANEL
Chol/HDL Ratio: 4.3 ratio units (ref 0.0–4.4)
Cholesterol, Total: 238 mg/dL — ABNORMAL HIGH (ref 100–199)
HDL: 55 mg/dL (ref >39)
LDL Calculated: 157 mg/dL — ABNORMAL HIGH (ref 0–99)
Triglycerides: 131 mg/dL (ref 0–149)

## 2012-11-12 LAB — CK: Total CK: 64 U/L (ref 24–173)

## 2012-12-03 ENCOUNTER — Encounter: Payer: Self-pay | Admitting: Internal Medicine

## 2012-12-03 ENCOUNTER — Ambulatory Visit (INDEPENDENT_AMBULATORY_CARE_PROVIDER_SITE_OTHER): Payer: Medicare Other | Admitting: Internal Medicine

## 2012-12-03 VITALS — BP 134/78 | HR 68 | Temp 97.1°F | Resp 14 | Ht <= 58 in | Wt 124.6 lb

## 2012-12-03 DIAGNOSIS — J069 Acute upper respiratory infection, unspecified: Secondary | ICD-10-CM

## 2012-12-03 DIAGNOSIS — J4530 Mild persistent asthma, uncomplicated: Secondary | ICD-10-CM

## 2012-12-03 DIAGNOSIS — R05 Cough: Secondary | ICD-10-CM

## 2012-12-03 DIAGNOSIS — J45909 Unspecified asthma, uncomplicated: Secondary | ICD-10-CM

## 2012-12-03 MED ORDER — ALBUTEROL SULFATE HFA 108 (90 BASE) MCG/ACT IN AERS: 2.0000 | INHALATION_SPRAY | Freq: Four times a day (QID) | RESPIRATORY_TRACT | Status: DC | PRN

## 2012-12-03 MED ORDER — SIMVASTATIN 40 MG PO TABS: 40.0000 mg | ORAL_TABLET | Freq: Every evening | ORAL | Status: DC

## 2012-12-03 MED ORDER — ALENDRONATE SODIUM 70 MG PO TABS: 70.0000 mg | ORAL_TABLET | ORAL | Status: DC

## 2012-12-03 MED ORDER — GUAIFENESIN-DM 100-10 MG/5ML PO SYRP: 5.0000 mL | ORAL_SOLUTION | Freq: Three times a day (TID) | ORAL | Status: DC | PRN

## 2012-12-03 MED ORDER — AZITHROMYCIN 250 MG PO TABS: ORAL_TABLET | ORAL | Status: DC

## 2012-12-03 MED ORDER — LEVOTHYROXINE SODIUM 50 MCG PO TABS: 50.0000 ug | ORAL_TABLET | Freq: Every day | ORAL | Status: DC

## 2012-12-03 NOTE — Progress Notes (Signed)
Patient ID: [name] [name], female   DOB: March 26, 1946, 67 y.o.   MRN: [id number]  Chief Complaint  Patient presents with  . Cough   HPI- Cough for 3 months, dry cough. It bothers her more at night time making it difficult to sleep. No hemoptysis. No runny nose. No sore throat No chest pain No shortness of breath Denies any bad taste or regurgitation of food in her mouth  ROS- No fever or chills Appetite is good No bowel/ bladder complaints  Reviewed her labs  BP 134/78  Pulse 68  Temp(Src) 97.1 F (36.2 C) (Oral)  Resp 14  Ht 4\' 10"  (1.473 m)  Wt 124 lb 9.6 oz (56.518 kg)  BMI 26.05 kg/m2  Constitutional: She is oriented to person, place, and time. She appears well-developed and well-nourished. No distress.  HENT:   Head: Normocephalic and atraumatic.  Right Ear: External ear normal.  Left Ear: External ear normal.   Nose: Nose normal.   Mouth/Throat: Oropharynx is clear and moist.  Oropharyngeal erythema present  Eyes: Conjunctivae and EOM are normal. Pupils are equal, round, and reactive to light.  Neck: Normal range of motion. No thyromegaly present.  Cardiovascular: Normal rate, regular rhythm, normal heart sounds and intact distal pulses.    No murmur heard. Pulmonary/Chest: Effort normal and breath sounds normal. She has mild wheezes. Abdominal: Soft. Bowel sounds are normal.  Musculoskeletal: Normal range of motion.  Lymphadenopathy:    She has no cervical adenopathy.   Labs- CBC    Component Value Date/Time   WBC 8.2 11/11/2012 0819   WBC 14.3* 12/01/2011 0432   RBC 4.16 11/11/2012 0819   RBC 3.02* 12/01/2011 0432   HGB 11.5 11/11/2012 0819   HCT 34.9 11/11/2012 0819   PLT 241 12/01/2011 0432   MCV 84 11/11/2012 0819   MCH 27.6 11/11/2012 0819   MCH 31.1 12/01/2011 0432   MCHC 33.0 11/11/2012 0819   MCHC 35.1 12/01/2011 0432   RDW 14.4 11/11/2012 0819   RDW 13.0 12/01/2011 0432   LYMPHSABS 2.8 11/11/2012 0819   LYMPHSABS 2.8 11/22/2011 1130   MONOABS 0.9 11/22/2011 1130   EOSABS 0.1 11/11/2012 0819   EOSABS 0.1 11/22/2011 1130   BASOSABS 0.1 11/11/2012 0819   BASOSABS 0.1 11/22/2011 1130    CMP     Component Value Date/Time   NA 140 11/11/2012 0819   NA 132* 11/30/2011 0410   K 4.6 11/11/2012 0819   CL 101 11/11/2012 0819   CO2 24 11/11/2012 0819   GLUCOSE 99 11/11/2012 0819   GLUCOSE 139* 11/30/2011 0410   BUN 10 11/11/2012 0819   BUN 5* 11/30/2011 0410   CREATININE 0.55* 11/11/2012 0819   CALCIUM 9.4 11/11/2012 0819   PROT 6.6 11/11/2012 0819   PROT 7.6 11/22/2011 1130   ALBUMIN 4.2 11/22/2011 1130   AST 18 11/11/2012 0819   ALT 16 11/11/2012 0819   ALKPHOS 80 11/11/2012 0819   BILITOT 0.3 11/11/2012 0819   GFRNONAA 96 11/11/2012 0819   GFRAA 111 11/11/2012 0819   a1c 6.3  tsh 3.760  Lipid Panel     Component Value Date/Time   TRIG 131 11/11/2012 0819   HDL 55 11/11/2012 0819   CHOLHDL 4.3 11/11/2012 0819   LDLCALC 157* 11/11/2012 0819    ASSESSMENT/PLAN-  Cough- persists. Concerns for URI and reactive airway disease  G[place] Peers- will treat her with azithromycin for 5 days and robitussin DM for now   Reactive airway disease- will provide albuterol inhaler q6h  to help with her wheezing and to treat with a course of antibiotics for possible URI as well. If no improvement will get cxr and assess further  GERD- continue PPI  Hypothyroidism- Continue current levothyroxine regimen  Hyperlipidemia- continue current dose of statin and cut down on fried food and sweets for now

## 2012-12-10 ENCOUNTER — Ambulatory Visit: Payer: Medicare Other | Admitting: Internal Medicine

## 2012-12-17 ENCOUNTER — Ambulatory Visit (INDEPENDENT_AMBULATORY_CARE_PROVIDER_SITE_OTHER): Payer: Medicare Other | Admitting: Internal Medicine

## 2012-12-17 ENCOUNTER — Encounter: Payer: Self-pay | Admitting: Internal Medicine

## 2012-12-17 VITALS — BP 124/68 | HR 81 | Temp 98.0°F | Resp 14 | Ht <= 58 in | Wt 125.6 lb

## 2012-12-17 DIAGNOSIS — R059 Cough, unspecified: Secondary | ICD-10-CM

## 2012-12-17 DIAGNOSIS — R7303 Prediabetes: Secondary | ICD-10-CM

## 2012-12-17 DIAGNOSIS — E785 Hyperlipidemia, unspecified: Secondary | ICD-10-CM

## 2012-12-17 DIAGNOSIS — R7309 Other abnormal glucose: Secondary | ICD-10-CM

## 2012-12-17 DIAGNOSIS — R05 Cough: Secondary | ICD-10-CM | POA: Insufficient documentation

## 2012-12-17 NOTE — Progress Notes (Signed)
Patient ID: Jakeia Carreras, female   DOB: 1945-07-08, 67 y.o.   MRN: [id number]  Chief Complaint  Patient presents with  . Cough   No Known Allergies  HPI- Seen today for follow up of her cough. It has resolved now. She completed course of z pack and robitussin. She has also completed albuterol treatment.   ROS- No fever or chills Appetite is good No bowel/ bladder complaints  Reviewed her labs  Exam  BP 124/68  Pulse 81  Temp(Src) 98 F (36.7 C) (Oral)  Resp 14  Ht 4\' 10"  (1.473 m)  Wt 125 lb 9.6 oz (56.972 kg)  BMI 26.26 kg/m2  Constitutional: She is oriented to person, place, and time. She appears well-developed and well-nourished. No distress.   HENT:   Head: Normocephalic and atraumatic.  Right Ear: External ear normal.  Left Ear: External ear normal.   Nose: Nose normal.   Mouth/Throat: Oropharynx is clear and moist. No erythema Cardiovascular: Normal rate, regular rhythm, normal heart sounds and intact distal pulses.    No murmur heard. Pulmonary/Chest: Effort normal and breath sounds normal. She has mild wheezes. Lymphadenopathy:    She has no cervical adenopathy.    Assessment/plan  Cough- resolved.   GERD- continue PPI  Hypothyroidism- Continue current levothyroxine regimen  Prediabetes- cut down on sweets and fried food. To check a1c prior to next visit

## 2013-01-07 ENCOUNTER — Other Ambulatory Visit: Payer: Self-pay | Admitting: Internal Medicine

## 2013-01-07 ENCOUNTER — Encounter: Payer: Self-pay | Admitting: Internal Medicine

## 2013-01-07 ENCOUNTER — Ambulatory Visit (INDEPENDENT_AMBULATORY_CARE_PROVIDER_SITE_OTHER): Payer: Medicare Other | Admitting: Internal Medicine

## 2013-01-07 VITALS — BP 142/88 | HR 94 | Temp 98.9°F | Resp 16 | Ht <= 58 in | Wt 124.2 lb

## 2013-01-07 DIAGNOSIS — Z23 Encounter for immunization: Secondary | ICD-10-CM | POA: Insufficient documentation

## 2013-01-07 DIAGNOSIS — E039 Hypothyroidism, unspecified: Secondary | ICD-10-CM

## 2013-01-07 DIAGNOSIS — L309 Dermatitis, unspecified: Secondary | ICD-10-CM

## 2013-01-07 DIAGNOSIS — L299 Pruritus, unspecified: Secondary | ICD-10-CM

## 2013-01-07 DIAGNOSIS — L259 Unspecified contact dermatitis, unspecified cause: Secondary | ICD-10-CM

## 2013-01-07 MED ORDER — TRIAMCINOLONE ACETONIDE 0.1 % EX CREA: TOPICAL_CREAM | Freq: Two times a day (BID) | CUTANEOUS | Status: DC

## 2013-01-07 MED ORDER — DIPHENHYDRAMINE HCL 25 MG PO CAPS: 25.0000 mg | ORAL_CAPSULE | Freq: Three times a day (TID) | ORAL | Status: DC | PRN

## 2013-01-07 MED ORDER — HYDROXYZINE HCL 10 MG PO TABS: 10.0000 mg | ORAL_TABLET | Freq: Two times a day (BID) | ORAL | Status: DC | PRN

## 2013-01-07 MED ORDER — TRIAMCINOLONE ACETONIDE 0.5 % EX OINT: TOPICAL_OINTMENT | Freq: Two times a day (BID) | CUTANEOUS | Status: DC

## 2013-01-07 NOTE — Progress Notes (Signed)
Patient ID: [name] [name], female   DOB: [birth year], 67 y.o.   MRN: [id number]  Chief Complaint  Patient presents with  . Rash  . Pruritis   HPI 67 y/o female patient here with her husband. She has noticed a rash in her lower abdomen and thigh areas for a week now associated with itching. She describes it as small red patches. Se takes shower twice a day and is using dettol soap. She applies vaseline cream post shower. No rash elsewhere. No drainage from the lesion. No one else in the house has it.  No new medication  She had developed rash similar to this few months back in [place] when she had eggplant. She mentions having eggplant a week back here  ROS Denies any fever or chills Denies any chets pain No shortness of breath No bowel/ bladder complaints  Past Medical History  Diagnosis Date  . Hypothyroidism   . Headache(784.0)     WHEN REFLUX SEVERE  . GERD (gastroesophageal reflux disease)     PT STATES SEVERE REFLUX IF SHE DOES NOT TAKE HER OMEPRAZOLE  . Arthritis     OA AND PAIN RIGHT KNEE  . Vegetarian diet   . Osteoarthrosis, unspecified whether generalized or localized, lower leg   . Degeneration of intervertebral disc, site unspecified   . Other specified pre-operative examination 11/07/2011  . Unspecified vitamin D deficiency   . Thoracic or lumbosacral neuritis or radiculitis, unspecified   . Encounter for long-term (current) use of other medications   . Long term (current) use of anticoagulants   . Osteoporosis, unspecified   . Disorder of bone and cartilage, unspecified   . Acute bronchiolitis due to respiratory syncytial virus (RSV)   . Acute bronchiolitis due to other infectious organisms   . Other atopic dermatitis and related conditions   . Other malaise and fatigue   . Disturbance of skin sensation   . Other and unspecified hyperlipidemia    No Known Allergies  Medication reviewed  BP 142/88  Pulse 94  Temp(Src) 98.9 F (37.2 C) (Oral)  Resp 16  Ht  4\' 10"  (1.473 m)  Wt 124 lb 3.2 oz (56.337 kg)  BMI 25.96 kg/m2  SpO2 98%  Physical Exam  Constitutional: She is oriented to person, place, and time. She appears well-developed and well-nourished. No distress.  HENT:   Head: Normocephalic and atraumatic.  Eyes: Conjunctivae and EOM are normal. Pupils are equal, round, and reactive to light.  Neck: Normal range of motion. No thyromegaly present.  Cardiovascular: Normal rate, regular rhythm, normal heart sounds and intact distal pulses.    No murmur heard. Pulmonary/Chest: Effort normal and breath sounds normal. She has no wheezes.  Abdominal: Soft. Bowel sounds are normal.  Musculoskeletal: Normal range of motion.  Lymphadenopathy:    She has no cervical adenopathy.  Neurological: She is alert and oriented to person, place, and time.  Skin: Skin is warm and dry. She is not diaphoretic. Has scattered nickel size raised lesion on her anterior lower abdomen and lower trunk. She also has it in her thighs. Mild erythema, rough to touch, no drainage, no open skin lesion Psychiatric: She has a normal mood and affect. Her behavior is normal. Thought content normal.   LABS-       06/06/2009  CMP: glucose 90, BUN 9, Creatinine 0.58 Lipid: Cholesterol 161, Triglycerides 139, HDL 57, LDL 76 TSH: 0.114 1/47/8295  TSH: 1.130 11/15/2009  Vitamin D: 32.4 07/20/2010 wbc 9.2, hb 11.7, plt 352  01/24/2011  BMP: glucose 98, BUN 14, Creatinine 0.63 Lipid: Cholesterol 186, Triglycerides 130, HDL 63, LDL 97 TSH: 2.460 Vitamin D: 21.7 11/07/11 ekg- nsr, normsal axis, no st-t wave changes 11/07/2011  CBC: wbc 10.1, rbc 4.31, Hemoglobin 12.9  CBC    Component Value Date/Time   WBC 8.2 11/11/2012 0819   WBC 14.3* 12/01/2011 0432   RBC 4.16 11/11/2012 0819   RBC 3.02* 12/01/2011 0432   HGB 11.5 11/11/2012 0819   HCT 34.9 11/11/2012 0819   PLT 241 12/01/2011 0432   MCV 84 11/11/2012 0819   MCH 27.6 11/11/2012 0819   MCH 31.1 12/01/2011 0432   MCHC 33.0 11/11/2012 0819    MCHC 35.1 12/01/2011 0432   RDW 14.4 11/11/2012 0819   RDW 13.0 12/01/2011 0432   LYMPHSABS 2.8 11/11/2012 0819   LYMPHSABS 2.8 11/22/2011 1130   MONOABS 0.9 11/22/2011 1130   EOSABS 0.1 11/11/2012 0819   EOSABS 0.1 11/22/2011 1130   BASOSABS 0.1 11/11/2012 0819   BASOSABS 0.1 11/22/2011 1130    ASSESSMENT/PLAN-  Rash- concerns for eczema with the appearance of the lesion. Avoid eggplants. Will have her on triamcinolone 0.55 bid for 2 weeks and then 0.1% bid for further 3-4 weeks. To notify if no improvement in rash  Pruritis- will provide her hydroxyzine 10 mg BID prn for itching  GERD- continue ppi for now  Hypothyroidism- Continue current levothyroxine regimen  Flu vaccine provided

## 2013-02-04 ENCOUNTER — Encounter: Payer: Self-pay | Admitting: *Deleted

## 2013-02-16 ENCOUNTER — Other Ambulatory Visit: Payer: Medicare Other

## 2013-02-16 ENCOUNTER — Other Ambulatory Visit: Payer: Self-pay | Admitting: Internal Medicine

## 2013-02-16 DIAGNOSIS — R7303 Prediabetes: Secondary | ICD-10-CM

## 2013-02-16 DIAGNOSIS — E785 Hyperlipidemia, unspecified: Secondary | ICD-10-CM

## 2013-02-17 LAB — HEMOGLOBIN A1C: Est. average glucose Bld gHb Est-mCnc: 123 mg/dL

## 2013-03-03 ENCOUNTER — Encounter: Payer: Self-pay | Admitting: Internal Medicine

## 2013-03-03 ENCOUNTER — Encounter: Payer: Self-pay | Admitting: *Deleted

## 2013-03-03 ENCOUNTER — Ambulatory Visit (INDEPENDENT_AMBULATORY_CARE_PROVIDER_SITE_OTHER): Payer: Medicare Other | Admitting: Internal Medicine

## 2013-03-03 VITALS — BP 118/70 | HR 88 | Wt 124.0 lb

## 2013-03-03 DIAGNOSIS — M1710 Unilateral primary osteoarthritis, unspecified knee: Secondary | ICD-10-CM

## 2013-03-03 DIAGNOSIS — M1712 Unilateral primary osteoarthritis, left knee: Secondary | ICD-10-CM

## 2013-03-03 DIAGNOSIS — R7303 Prediabetes: Secondary | ICD-10-CM

## 2013-03-03 DIAGNOSIS — L989 Disorder of the skin and subcutaneous tissue, unspecified: Secondary | ICD-10-CM

## 2013-03-03 DIAGNOSIS — E785 Hyperlipidemia, unspecified: Secondary | ICD-10-CM

## 2013-03-03 DIAGNOSIS — L408 Other psoriasis: Secondary | ICD-10-CM

## 2013-03-03 DIAGNOSIS — R7309 Other abnormal glucose: Secondary | ICD-10-CM

## 2013-03-03 DIAGNOSIS — K219 Gastro-esophageal reflux disease without esophagitis: Secondary | ICD-10-CM

## 2013-03-03 DIAGNOSIS — M810 Age-related osteoporosis without current pathological fracture: Secondary | ICD-10-CM

## 2013-03-03 DIAGNOSIS — E039 Hypothyroidism, unspecified: Secondary | ICD-10-CM

## 2013-03-03 MED ORDER — OMEPRAZOLE 20 MG PO CPDR: DELAYED_RELEASE_CAPSULE | ORAL | Status: DC

## 2013-03-03 MED ORDER — BETAMETHASONE DIPROPIONATE 0.05 % EX CREA: TOPICAL_CREAM | Freq: Two times a day (BID) | CUTANEOUS | Status: DC

## 2013-03-03 MED ORDER — MELOXICAM 15 MG PO TABS: ORAL_TABLET | ORAL | Status: DC

## 2013-03-03 NOTE — Progress Notes (Signed)
Patient ID: [name] [name], female   DOB: Jul 30, 1945, 67 y.o.   MRN: [id number]  Chief Complaint  Patient presents with  . Medical Managment of Chronic Issues    4 month follow-up, discuss labs (copy printed)  . Pruritis    C/O itching all over, ongoing concern     No Known Allergies  HPI 67 y/o female patient is here for routine follow up. Her knee pain had improved with mobic but currently she has ran out of it and needs refill. Reviewed her labs. Cough has resolved.  The triamcinolone cream had helped her with her rash but now she has ran out of the cream and the skin lesions have worsened and spread out to her arms, legs, trunk areas. Denies any on face and scalp area. It is associated with itching  Review of Systems  Constitutional: Negative for fever, chills, weight loss, malaise/fatigue and diaphoresis.  HENT: Negative for hearing loss, ear pain, nosebleeds, congestion, sore throat, neck pain, tinnitus and ear discharge.   Eyes: Negative for blurred vision, double vision, photophobia, pain, discharge and redness.  Respiratory: Negative for hemoptysis, cough, shortness of breath, wheezing and stridor.   Cardiovascular: Negative for chest pain, palpitations, orthopnea, claudication, leg swelling and PND.  Gastrointestinal: Negative for heartburn, nausea, vomiting, abdominal pain, diarrhea, constipation and blood in stool.  Genitourinary: Negative for dysuria, urgency, frequency, hematuria and flank pain.  Musculoskeletal: Negative for back pain and falls. Positive for knee joint pain Skin: posiitve for itching and rash.  Neurological: Negative for dizziness, tingling, tremors, sensory change, speech change, focal weakness, seizures, loss of consciousness, weakness and headaches.  Endo/Heme/Allergies: Does not bruise/bleed easily.  Psychiatric/Behavioral: Negative for depression, suicidal ideas, hallucinations, memory loss and substance abuse. The patient is not nervous/anxious and does  not have insomnia.    Past Medical History  Diagnosis Date  . Hypothyroidism   . Headache(784.0)     WHEN REFLUX SEVERE  . GERD (gastroesophageal reflux disease)     PT STATES SEVERE REFLUX IF SHE DOES NOT TAKE HER OMEPRAZOLE  . Arthritis     OA AND PAIN RIGHT KNEE  . Vegetarian diet   . Osteoarthrosis, unspecified whether generalized or localized, lower leg   . Degeneration of intervertebral disc, site unspecified   . Other specified pre-operative examination 11/07/2011  . Unspecified vitamin D deficiency   . Thoracic or lumbosacral neuritis or radiculitis, unspecified   . Encounter for long-term (current) use of other medications   . Long term (current) use of anticoagulants   . Osteoporosis, unspecified   . Disorder of bone and cartilage, unspecified   . Acute bronchiolitis due to respiratory syncytial virus (RSV)   . Acute bronchiolitis due to other infectious organisms   . Other atopic dermatitis and related conditions   . Other malaise and fatigue   . Disturbance of skin sensation   . Other and unspecified hyperlipidemia    Past Surgical History  Procedure Laterality Date  . Teeth extractions--no other surgery    . Total knee arthroplasty  11/28/2011    Procedure: TOTAL KNEE ARTHROPLASTY;  Surgeon: Jacki Cones, MD;  Location: [facility];  Service: Orthopedics;  Laterality: Right;  . Tubal ligation     Current Outpatient Prescriptions on File Prior to Visit  Medication Sig Dispense Refill  . alendronate (FOSAMAX) 70 MG tablet Take 1 tablet (70 mg total) by mouth every 7 (seven) days. On THURSDAY. Take with a full glass of water on an empty stomach.  12  tablet  3  . aspirin 81 MG tablet Take 81 mg by mouth daily.      . calcium carbonate (OS-CAL) 600 MG TABS Take 600 mg by mouth 2 (two) times daily with a meal. Take one tablet twice a day for calcium supplement      . diphenhydrAMINE (BENADRYL) 25 mg capsule Take 1 capsule (25 mg total) by mouth every 8 (eight) hours as  needed for itching.  30 capsule  0  . levothyroxine (SYNTHROID, LEVOTHROID) 50 MCG tablet Take 1 tablet (50 mcg total) by mouth daily before breakfast.  90 tablet  3  . simvastatin (ZOCOR) 40 MG tablet Take 1 tablet (40 mg total) by mouth every evening.  90 tablet  3   No current facility-administered medications on file prior to visit.    Physical exam  BP 118/70  Pulse 88  Wt 124 lb (56.246 kg)  BMI 25.92 kg/m2  SpO2 97%   Constitutional: She is oriented to person, place, and time. She appears well-developed and well-nourished. No distress.  HENT:   Head: Normocephalic and atraumatic.  Right Ear: External ear normal.  Left Ear: External ear normal.   Nose: Nose normal.   Mouth/Throat: Oropharynx is clear and moist.  Eyes: Conjunctivae and EOM are normal. Pupils are equal, round, and reactive to light.  Neck: Normal range of motion. No thyromegaly present.  Cardiovascular: Normal rate, regular rhythm, normal heart sounds and intact distal pulses.    No murmur heard. Pulmonary/Chest: Effort normal and breath sounds normal. She has no wheezes.  Abdominal: Soft. Bowel sounds are normal.  Musculoskeletal: Normal range of motion.  Lymphadenopathy:    She has no cervical adenopathy.  Neurological: She is alert and oriented to person, place, and time.  Skin: Skin is excessively dry. Has erythematous patches as papules scattered on armand legs( mainly extensor surfaces) and on the trunk area as well with silver scale over most of them. Non tender, normal temperature, no open sores, no drainage Psychiatric: She has a normal mood and affect. Her behavior is normal. Thought content normal.   LABS-       06/06/2009  CMP: glucose 90, BUN 9, Creatinine 0.58 Lipid: Cholesterol 161, Triglycerides 139, HDL 57, LDL 76 TSH: 0.114 1/61/0960  TSH: 1.130 11/15/2009  Vitamin D: 32.4 07/20/2010 wbc 9.2, hb 11.7, plt 352 01/24/2011  BMP: glucose 98, BUN 14, Creatinine 0.63 Lipid: Cholesterol 186,  Triglycerides 130, HDL 63, LDL 97 TSH: 2.460 Vitamin D: 21.7 11/07/11 ekg- nsr, normsal axis, no st-t wave changes 11/07/2011  CBC: wbc 10.1, rbc 4.31, Hemoglobin 12.9 02/16/13 a1c 5.6  ASSESSMENT/PLAN-  Psoriasis- the skin lesion fits book description of psoriasis lesion. Will have her on betamethason 0.05% cream bid for now and reassess in 2 weeks. Also to apply abundant moisturizer to her skin. To take prn benadryl for itching  Osteoporosis- continue fosamax and ca-vit d supplement. Recheck dexa scan with last one in 2011 with t score of -3 at lumbar spine and -1.9 at left femur neck. Fall precautions  Osteoarthritis- continue mobic, refill provided  GERD- continue ppi for now, refills provided  Hypothyroidism- continue current levothyroxine regimen and recheck tsh prior to next visit  Hyperlipidemia- continue current dose of statin

## 2013-03-17 ENCOUNTER — Encounter: Payer: Self-pay | Admitting: Internal Medicine

## 2013-03-17 ENCOUNTER — Ambulatory Visit (INDEPENDENT_AMBULATORY_CARE_PROVIDER_SITE_OTHER): Payer: Medicare Other | Admitting: Internal Medicine

## 2013-03-17 VITALS — BP 114/72 | HR 109 | Temp 97.9°F | Wt 125.2 lb

## 2013-03-17 DIAGNOSIS — L409 Psoriasis, unspecified: Secondary | ICD-10-CM | POA: Insufficient documentation

## 2013-03-17 DIAGNOSIS — L408 Other psoriasis: Secondary | ICD-10-CM

## 2013-03-17 DIAGNOSIS — M810 Age-related osteoporosis without current pathological fracture: Secondary | ICD-10-CM

## 2013-03-17 DIAGNOSIS — K219 Gastro-esophageal reflux disease without esophagitis: Secondary | ICD-10-CM

## 2013-03-17 DIAGNOSIS — J45991 Cough variant asthma: Secondary | ICD-10-CM

## 2013-03-17 DIAGNOSIS — R05 Cough: Secondary | ICD-10-CM

## 2013-03-17 MED ORDER — ALBUTEROL SULFATE HFA 108 (90 BASE) MCG/ACT IN AERS: 2.0000 | INHALATION_SPRAY | Freq: Four times a day (QID) | RESPIRATORY_TRACT | Status: DC | PRN

## 2013-03-17 NOTE — Progress Notes (Signed)
Patient ID: [name] [name], female   DOB: 10-04-1945, 67 y.o.   MRN: [id number]  Chief Complaint  Patient presents with  . Medical Managment of Chronic Issues    2 week f/u rash  . Immunizations    will get records    No Known Allergies  HPI 67 y/o female patient is here for follow up on her rash. She was started on rx for eczema 2 weeks back and her rash has resolved.  She complaints of intermittent dry cough at night time. Denies any sour brash or reflux or heartburn. Has been taking her PPI She is on fosamax for several years now Denies SOB or chest pain Her knee pain had improved with mobic   Review of Systems   Constitutional: Negative for fever, chills, weight loss, malaise/fatigue and diaphoresis.   HENT: Negative for hearing loss, ear pain, nosebleeds, congestion, sore throat, neck pain, tinnitus and ear discharge.    Respiratory: Negative for hemoptysis, shortness of breath, wheezing and stridor.    Cardiovascular: Negative for chest pain, palpitations, orthopnea, claudication, leg swelling and PND.   Skin: resolved rash and itching Psychiatric/Behavioral: Negative for depression, suicidal ideas, hallucinations, memory loss  BP 114/72  Pulse 109  Temp(Src) 97.9 F (36.6 C) (Oral)  Wt 125 lb 3.2 oz (56.79 kg)  SpO2 98%    Constitutional: She is oriented to person, place, and time. She appears well-developed and well-nourished. No distress.   HENT:   Head: Normocephalic and atraumatic.  Right Ear: External ear normal.  Left Ear: External ear normal.   Nose: Nose normal.   Mouth/Throat: Oropharynx is clear and moist.   Eyes: Conjunctivae and EOM are normal. Pupils are equal, round, and reactive to light.   Neck: Normal range of motion. No thyromegaly present.   Cardiovascular: Normal rate, regular rhythm, normal heart sounds and intact distal pulses.    No murmur heard. Pulmonary/Chest: Effort normal and breath sounds normal. She has no wheezes.   Abdominal: Soft.  Bowel sounds are normal.  Musculoskeletal: Normal range of motion.  Lymphadenopathy:    She has no cervical adenopathy.  Neurological: She is alert and oriented to person, place, and time.   Skin: mostlyy resolved but has darkened,smooth patches in the bottom area  Psychiatric: She has a normal mood and affect. Her behavior is normal. Thought content normal.    ASSESSMENT/PLAN-  Cough- concern for cough variant asthma vs worsening reflux from fosamax. Will d/c fosamax and have her on prn albuterol- mainly at bedtime. Reassess if no improvement  Psoriasis- betamethason cream had been helpful sand rash has resolved. Pt advised to use moisturizer abundantly to help retain moisture  Osteoporosis- with recent more frequent  Cough episode at night, i have concerns for fosamax induced reflux and will stop fosamax. Has upcoming dexa scan. Will review that and if t score has worsened, consider injection bisphosphates vs RANK ligand.  dexa scan  in 2011 with t score of -3 at lumbar spine and -1.9 at left femur neck. Fall precautions  GERD- continue ppi for now, refills provided

## 2013-03-20 ENCOUNTER — Encounter: Payer: Self-pay | Admitting: Internal Medicine

## 2013-04-03 ENCOUNTER — Ambulatory Visit
Admission: RE | Admit: 2013-04-03 | Discharge: 2013-04-03 | Disposition: A | Discharge status: Home / Self Care | Payer: Medicare Other | Source: Ambulatory Visit | Attending: Internal Medicine | Admitting: Internal Medicine

## 2013-04-03 DIAGNOSIS — M810 Age-related osteoporosis without current pathological fracture: Secondary | ICD-10-CM

## 2013-04-21 ENCOUNTER — Encounter: Payer: Self-pay | Admitting: Internal Medicine

## 2013-04-21 ENCOUNTER — Ambulatory Visit (INDEPENDENT_AMBULATORY_CARE_PROVIDER_SITE_OTHER): Payer: Medicare Other | Admitting: Internal Medicine

## 2013-04-21 VITALS — BP 122/74 | HR 78 | Ht <= 58 in | Wt 126.0 lb

## 2013-04-21 DIAGNOSIS — K219 Gastro-esophageal reflux disease without esophagitis: Secondary | ICD-10-CM

## 2013-04-21 DIAGNOSIS — M810 Age-related osteoporosis without current pathological fracture: Secondary | ICD-10-CM | POA: Insufficient documentation

## 2013-04-21 MED ORDER — BETAMETHASONE DIPROPIONATE 0.05 % EX CREA: TOPICAL_CREAM | Freq: Two times a day (BID) | CUTANEOUS | Status: DC

## 2013-04-21 MED ORDER — DENOSUMAB 60 MG/ML ~~LOC~~ SOLN: 60.0000 mg | SUBCUTANEOUS | Status: DC

## 2013-04-23 NOTE — Progress Notes (Signed)
Patient ID: [name] [name], female   DOB: [birth year], 67 y.o.   MRN: [id number]  Chief Complaint  Patient presents with  . Follow-up    review bone scan    HPI 67 y/o female patient is here to review her bone scan result. She has history of osteoporosis and had repeat bone scan. Her fosamax was stopped given few years of being on it and her having reflux symptoms Denies any bone pain. No falls reported. No other concerns this visit  Review of Systems  Constitutional: Negative for fever, chills, weight loss, malaise/fatigue and diaphoresis.  Respiratory: Negative for cough, sputum production, shortness of breath and wheezing.   Cardiovascular: Negative for chest pain, palpitations, orthopnea and leg swelling.  Gastrointestinal: Negative for nausea, vomiting, abdominal pain, diarrhea and constipation.  Musculoskeletal: Negative for back pain, falls, joint pain and myalgias.  Skin: Negative for itching and rash.  Psychiatric/Behavioral: Negative for depression and memory loss. The patient is not nervous/anxious.    Past Medical History  Diagnosis Date  . Hypothyroidism   . Headache(784.0)     WHEN REFLUX SEVERE  . GERD (gastroesophageal reflux disease)     PT STATES SEVERE REFLUX IF SHE DOES NOT TAKE HER OMEPRAZOLE  . Arthritis     OA AND PAIN RIGHT KNEE  . Vegetarian diet   . Osteoarthrosis, unspecified whether generalized or localized, lower leg   . Degeneration of intervertebral disc, site unspecified   . Other specified pre-operative examination 11/07/2011  . Unspecified vitamin D deficiency   . Thoracic or lumbosacral neuritis or radiculitis, unspecified   . Encounter for long-term (current) use of other medications   . Long term (current) use of anticoagulants   . Osteoporosis, unspecified   . Disorder of bone and cartilage, unspecified   . Acute bronchiolitis due to respiratory syncytial virus (RSV)   . Acute bronchiolitis due to other infectious organisms   . Other atopic  dermatitis and related conditions   . Other malaise and fatigue   . Disturbance of skin sensation   . Other and unspecified hyperlipidemia    Past Surgical History  Procedure Laterality Date  . Teeth extractions--no other surgery    . Total knee arthroplasty  11/28/2011    Procedure: TOTAL KNEE ARTHROPLASTY;  Surgeon: Jacki Cones, MD;  Location: [facility];  Service: Orthopedics;  Laterality: Right;  . Tubal ligation     Medication reviewed. See [facility]  Physical exam BP 122/74  Pulse 78  Ht 4\' 8"  (1.422 m)  Wt 126 lb (57.153 kg)  BMI 28.26 kg/m2  SpO2 99%  General- elderly female in no acute distress Cardiovascular- normal s1,s2, no murmurs/ rubs/ gallops Respiratory- bilateral clear to auscultation, no wheeze, no rhonchi, no crackles Abdomen- bowel sounds present, soft, non tender Musculoskeletal- able to move all 4 extremities, no spinal and paraspinal tenderness, steady gait, no use of assistive device Neurological- no focal deficit, normal reflexes Psychiatry- alert and oriented to person, place and time, normal mood and affect  Assessment/plan  1. Osteoporosis, post-menopausal Will start her on prolia injection. Normal renal function. Will schedule her appointment for prolia after authorizing her medication. Will not provide fosamax again with her GERD   2. GERD (gastroesophageal reflux disease) Continue prilosec current regimen

## 2013-05-29 ENCOUNTER — Other Ambulatory Visit: Payer: Medicare Other

## 2013-05-29 DIAGNOSIS — E039 Hypothyroidism, unspecified: Secondary | ICD-10-CM

## 2013-05-29 DIAGNOSIS — E785 Hyperlipidemia, unspecified: Secondary | ICD-10-CM

## 2013-05-29 DIAGNOSIS — M810 Age-related osteoporosis without current pathological fracture: Secondary | ICD-10-CM

## 2013-05-29 DIAGNOSIS — L989 Disorder of the skin and subcutaneous tissue, unspecified: Secondary | ICD-10-CM

## 2013-05-30 LAB — CBC WITH DIFFERENTIAL/PLATELET
BASOS: 1 %
Basophils Absolute: 0.1 10*3/uL (ref 0.0–0.2)
Eos: 2 %
Eosinophils Absolute: 0.1 10*3/uL (ref 0.0–0.4)
HCT: 35.3 % (ref 34.0–46.6)
HEMOGLOBIN: 11.4 g/dL (ref 11.1–15.9)
Immature Grans (Abs): 0 10*3/uL (ref 0.0–0.1)
Immature Granulocytes: 0 %
Lymphocytes Absolute: 3 10*3/uL (ref 0.7–3.1)
Lymphs: 40 %
MCH: 26.6 pg (ref 26.6–33.0)
MCHC: 32.3 g/dL (ref 31.5–35.7)
MCV: 82 fL (ref 79–97)
MONOS ABS: 0.7 10*3/uL (ref 0.1–0.9)
Monocytes: 10 %
NEUTROS ABS: 3.6 10*3/uL (ref 1.4–7.0)
NEUTROS PCT: 47 %
RBC: 4.29 x10E6/uL (ref 3.77–5.28)
RDW: 14.6 % (ref 12.3–15.4)
WBC: 7.6 10*3/uL (ref 3.4–10.8)

## 2013-05-30 LAB — COMPREHENSIVE METABOLIC PANEL
A/G RATIO: 1.6 (ref 1.1–2.5)
ALK PHOS: 73 IU/L (ref 39–117)
ALT: 22 IU/L (ref 0–32)
AST: 26 IU/L (ref 0–40)
Albumin: 4.2 g/dL (ref 3.6–4.8)
BUN/Creatinine Ratio: 17 (ref 11–26)
BUN: 11 mg/dL (ref 8–27)
CHLORIDE: 100 mmol/L (ref 97–108)
CO2: 21 mmol/L (ref 18–29)
Calcium: 9.8 mg/dL (ref 8.7–10.3)
Creatinine, Ser: 0.63 mg/dL (ref 0.57–1.00)
GFR calc Af Amer: 106 mL/min/{1.73_m2} (ref >59)
GFR, EST NON AFRICAN AMERICAN: 92 mL/min/{1.73_m2} (ref >59)
Globulin, Total: 2.7 g/dL (ref 1.5–4.5)
Glucose: 104 mg/dL — ABNORMAL HIGH (ref 65–99)
POTASSIUM: 4.5 mmol/L (ref 3.5–5.2)
SODIUM: 139 mmol/L (ref 134–144)
Total Bilirubin: 0.4 mg/dL (ref 0.0–1.2)
Total Protein: 6.9 g/dL (ref 6.0–8.5)

## 2013-05-30 LAB — LIPID PANEL
Chol/HDL Ratio: 2.9 ratio units (ref 0.0–4.4)
Cholesterol, Total: 175 mg/dL (ref 100–199)
HDL: 61 mg/dL (ref >39)
LDL Calculated: 87 mg/dL (ref 0–99)
Triglycerides: 134 mg/dL (ref 0–149)
VLDL Cholesterol Cal: 27 mg/dL (ref 5–40)

## 2013-05-30 LAB — TSH: TSH: 2.29 u[IU]/mL (ref 0.450–4.500)

## 2013-06-01 ENCOUNTER — Encounter: Payer: Self-pay | Admitting: *Deleted

## 2013-06-02 ENCOUNTER — Ambulatory Visit (INDEPENDENT_AMBULATORY_CARE_PROVIDER_SITE_OTHER): Payer: Medicare Other | Admitting: Internal Medicine

## 2013-06-02 ENCOUNTER — Encounter: Payer: Self-pay | Admitting: Internal Medicine

## 2013-06-02 VITALS — BP 122/80 | HR 77 | Temp 97.9°F | Wt 127.2 lb

## 2013-06-02 DIAGNOSIS — M1712 Unilateral primary osteoarthritis, left knee: Secondary | ICD-10-CM

## 2013-06-02 DIAGNOSIS — L259 Unspecified contact dermatitis, unspecified cause: Secondary | ICD-10-CM

## 2013-06-02 DIAGNOSIS — M1710 Unilateral primary osteoarthritis, unspecified knee: Secondary | ICD-10-CM

## 2013-06-02 DIAGNOSIS — E785 Hyperlipidemia, unspecified: Secondary | ICD-10-CM

## 2013-06-02 DIAGNOSIS — R209 Unspecified disturbances of skin sensation: Secondary | ICD-10-CM

## 2013-06-02 DIAGNOSIS — K219 Gastro-esophageal reflux disease without esophagitis: Secondary | ICD-10-CM

## 2013-06-02 DIAGNOSIS — R200 Anesthesia of skin: Secondary | ICD-10-CM

## 2013-06-02 DIAGNOSIS — L309 Dermatitis, unspecified: Secondary | ICD-10-CM

## 2013-06-02 DIAGNOSIS — M810 Age-related osteoporosis without current pathological fracture: Secondary | ICD-10-CM

## 2013-06-02 DIAGNOSIS — E039 Hypothyroidism, unspecified: Secondary | ICD-10-CM

## 2013-06-02 DIAGNOSIS — IMO0002 Reserved for concepts with insufficient information to code with codable children: Secondary | ICD-10-CM

## 2013-06-02 MED ORDER — SIMVASTATIN 20 MG PO TABS: 20.0000 mg | ORAL_TABLET | Freq: Every evening | ORAL | Status: DC

## 2013-06-02 MED ORDER — LEVOTHYROXINE SODIUM 25 MCG PO TABS: 25.0000 ug | ORAL_TABLET | Freq: Every day | ORAL | Status: DC

## 2013-06-02 NOTE — Progress Notes (Signed)
Patient ID: [name] [name], female   DOB: 05/23/1945, 68 y.o.   MRN: [id number]    Chief Complaint  Patient presents with  . Medical Managment of Chronic Issues    3 month f/u, discuss labs (printed)  . other    never had colonoscopy   No Known Allergies  HPI 68 y/o female patient is here for follow up. She will be scheduled for prolia injection next visit. She complaints of occassional numbness in her hand and fingers at night. Takes her mobic on prn basis. Reviewed her labs. Reflux symptom under control.   No other concerns this visit  Review of Systems  Constitutional: Negative for fever, chills, weight loss, malaise/fatigue and diaphoresis.  Respiratory: Negative for cough, sputum production, shortness of breath and wheezing.   Cardiovascular: Negative for chest pain, palpitations, orthopnea and leg swelling.  Gastrointestinal: Negative for nausea, vomiting, abdominal pain, diarrhea and constipation.  Musculoskeletal: Negative for back pain, falls, joint pain and myalgias.  Skin: Negative for itching and rash.  Psychiatric/Behavioral: Negative for depression and memory loss. The patient is not nervous/anxious.    Past Medical History  Diagnosis Date  . Hypothyroidism   . Headache(784.0)     WHEN REFLUX SEVERE  . GERD (gastroesophageal reflux disease)     PT STATES SEVERE REFLUX IF SHE DOES NOT TAKE HER OMEPRAZOLE  . Arthritis     OA AND PAIN RIGHT KNEE  . Vegetarian diet   . Osteoarthrosis, unspecified whether generalized or localized, lower leg   . Degeneration of intervertebral disc, site unspecified   . Other specified pre-operative examination 11/07/2011  . Unspecified vitamin D deficiency   . Thoracic or lumbosacral neuritis or radiculitis, unspecified   . Encounter for long-term (current) use of other medications   . Long term (current) use of anticoagulants   . Osteoporosis, unspecified   . Disorder of bone and cartilage, unspecified   . Acute bronchiolitis due to  respiratory syncytial virus (RSV)   . Acute bronchiolitis due to other infectious organisms   . Other atopic dermatitis and related conditions   . Other malaise and fatigue   . Disturbance of skin sensation   . Other and unspecified hyperlipidemia    Current Outpatient Prescriptions on File Prior to Visit  Medication Sig Dispense Refill  . albuterol (PROVENTIL HFA;VENTOLIN HFA) 108 (90 BASE) MCG/ACT inhaler Inhale 2 puffs into the lungs every 6 (six) hours as needed for wheezing or shortness of breath.  1 Inhaler  0  . aspirin 81 MG tablet Take 81 mg by mouth daily.      . betamethasone dipropionate (DIPROLENE) 0.05 % cream Apply topically 2 (two) times daily.  45 g  1  . calcium carbonate (OS-CAL) 600 MG TABS Take 600 mg by mouth 2 (two) times daily with a meal. Take one tablet twice a day for calcium supplement      . denosumab (PROLIA) 60 MG/ML SOLN injection Inject 60 mg into the skin every 6 (six) months. Administer in upper arm, thigh, or abdomen  1 mL  0  . meloxicam (MOBIC) 15 MG tablet Take one tablet once daily for leg pain  90 tablet  3  . omeprazole (PRILOSEC) 20 MG capsule TAKE ONE CAPSULE BY MOUTH EVERY DAY TO  REDUCE  STOMACH  ACID  90 capsule  3   No current facility-administered medications on file prior to visit.    physical exam BP 122/80  Pulse 77  Temp(Src) 97.9 F (36.6 C) (Oral)  Wt 127 lb 3.2 oz (57.698 kg)  SpO2 98%  General- elderly female in no acute distress Cardiovascular- normal s1,s2, no murmurs/ rubs/ gallops Respiratory- bilateral clear to auscultation, no wheeze, no rhonchi, no crackles Abdomen- bowel sounds present, soft, non tender Musculoskeletal- able to move all 4 extremities, no spinal and paraspinal tenderness, steady gait, no use of assistive device, negative phalen's sign Neurological- no focal deficit, normal reflexes Psychiatry- alert and oriented to person, place and time, normal mood and affect  Labs- CBC    Component Value  Date/Time   WBC 7.6 05/29/2013 0826   WBC 14.3* 12/01/2011 0432   RBC 4.29 05/29/2013 0826   RBC 3.02* 12/01/2011 0432   HGB 11.4 05/29/2013 0826   HCT 35.3 05/29/2013 0826   PLT 241 12/01/2011 0432   MCV 82 05/29/2013 0826   MCH 26.6 05/29/2013 0826   MCH 31.1 12/01/2011 0432   MCHC 32.3 05/29/2013 0826   MCHC 35.1 12/01/2011 0432   RDW 14.6 05/29/2013 0826   RDW 13.0 12/01/2011 0432   LYMPHSABS 3.0 05/29/2013 0826   LYMPHSABS 2.8 11/22/2011 1130   MONOABS 0.9 11/22/2011 1130   EOSABS 0.1 05/29/2013 0826   EOSABS 0.1 11/22/2011 1130   BASOSABS 0.1 05/29/2013 0826   BASOSABS 0.1 11/22/2011 1130    CMP     Component Value Date/Time   NA 139 05/29/2013 0826   NA 132* 11/30/2011 0410   K 4.5 05/29/2013 0826   CL 100 05/29/2013 0826   CO2 21 05/29/2013 0826   GLUCOSE 104* 05/29/2013 0826   GLUCOSE 139* 11/30/2011 0410   BUN 11 05/29/2013 0826   BUN 5* 11/30/2011 0410   CREATININE 0.63 05/29/2013 0826   CALCIUM 9.8 05/29/2013 0826   PROT 6.9 05/29/2013 0826   PROT 7.6 11/22/2011 1130   ALBUMIN 4.2 11/22/2011 1130   AST 26 05/29/2013 0826   ALT 22 05/29/2013 0826   ALKPHOS 73 05/29/2013 0826   BILITOT 0.4 05/29/2013 0826   GFRNONAA 92 05/29/2013 0826   GFRAA 106 05/29/2013 0826   Lipid Panel     Component Value Date/Time   TRIG 134 05/29/2013 0826   HDL 61 05/29/2013 0826   CHOLHDL 2.9 05/29/2013 0826   LDLCALC 87 05/29/2013 0826    Assessment/plan  1. Numbness of fingers of both hands Will try wrsit splint for now and see if this helps with possible median nerve compression  2. GERD (gastroesophageal reflux disease) Continue prilosec 20 mg daily and reassess  3. Hypothyroidism Reviewed tsh. Decrease levothyroxine to 25 mcg daily  4. Osteoporosis, post-menopausal Schedule appointment for prolia injection. Continue oscal  5. Osteoarthritis of left knee Continue prn mobic for now  6. Eczema Imporved. Continue prn ointment, no changes  7. Hyperlipidemia LDL goal <130 Improved. Will decrease  simvastatin to 20 mg daily and monitor

## 2013-06-05 ENCOUNTER — Ambulatory Visit (INDEPENDENT_AMBULATORY_CARE_PROVIDER_SITE_OTHER): Payer: Medicare Other

## 2013-06-05 DIAGNOSIS — M810 Age-related osteoporosis without current pathological fracture: Secondary | ICD-10-CM

## 2013-06-05 MED ORDER — DENOSUMAB 60 MG/ML ~~LOC~~ SOLN
60.0000 mg | Freq: Once | SUBCUTANEOUS | Status: AC
  Administered 2013-06-05: 60 mg via SUBCUTANEOUS

## 2013-06-22 ENCOUNTER — Other Ambulatory Visit: Payer: Self-pay | Admitting: *Deleted

## 2013-06-22 MED ORDER — LEVOTHYROXINE SODIUM 25 MCG PO TABS: 25.0000 ug | ORAL_TABLET | Freq: Every day | ORAL | Status: DC

## 2013-09-29 ENCOUNTER — Other Ambulatory Visit: Payer: Self-pay | Admitting: Internal Medicine

## 2013-09-29 ENCOUNTER — Ambulatory Visit (INDEPENDENT_AMBULATORY_CARE_PROVIDER_SITE_OTHER): Payer: Medicare Other | Admitting: Internal Medicine

## 2013-09-29 ENCOUNTER — Encounter: Payer: Self-pay | Admitting: Internal Medicine

## 2013-09-29 VITALS — BP 140/88 | HR 83 | Temp 97.4°F | Resp 10 | Ht 58.5 in | Wt 130.0 lb

## 2013-09-29 DIAGNOSIS — M1710 Unilateral primary osteoarthritis, unspecified knee: Secondary | ICD-10-CM

## 2013-09-29 DIAGNOSIS — M810 Age-related osteoporosis without current pathological fracture: Secondary | ICD-10-CM

## 2013-09-29 DIAGNOSIS — M1712 Unilateral primary osteoarthritis, left knee: Secondary | ICD-10-CM

## 2013-09-29 DIAGNOSIS — R7309 Other abnormal glucose: Secondary | ICD-10-CM

## 2013-09-29 DIAGNOSIS — E559 Vitamin D deficiency, unspecified: Secondary | ICD-10-CM

## 2013-09-29 DIAGNOSIS — Z23 Encounter for immunization: Secondary | ICD-10-CM

## 2013-09-29 DIAGNOSIS — IMO0002 Reserved for concepts with insufficient information to code with codable children: Secondary | ICD-10-CM

## 2013-09-29 DIAGNOSIS — L408 Other psoriasis: Secondary | ICD-10-CM

## 2013-09-29 DIAGNOSIS — R7303 Prediabetes: Secondary | ICD-10-CM

## 2013-09-29 DIAGNOSIS — J45991 Cough variant asthma: Secondary | ICD-10-CM

## 2013-09-29 DIAGNOSIS — Z0000 Encounter for general adult medical examination without abnormal findings: Secondary | ICD-10-CM

## 2013-09-29 DIAGNOSIS — E785 Hyperlipidemia, unspecified: Secondary | ICD-10-CM

## 2013-09-29 DIAGNOSIS — Z1231 Encounter for screening mammogram for malignant neoplasm of breast: Secondary | ICD-10-CM

## 2013-09-29 DIAGNOSIS — E039 Hypothyroidism, unspecified: Secondary | ICD-10-CM

## 2013-09-29 DIAGNOSIS — K219 Gastro-esophageal reflux disease without esophagitis: Secondary | ICD-10-CM

## 2013-09-29 DIAGNOSIS — L409 Psoriasis, unspecified: Secondary | ICD-10-CM

## 2013-09-29 MED ORDER — BETAMETHASONE DIPROPIONATE 0.05 % EX CREA: TOPICAL_CREAM | Freq: Two times a day (BID) | CUTANEOUS | Status: DC

## 2013-09-29 MED ORDER — LEVOTHYROXINE SODIUM 25 MCG PO TABS: 25.0000 ug | ORAL_TABLET | Freq: Every day | ORAL | Status: DC

## 2013-09-29 MED ORDER — OMEPRAZOLE 20 MG PO CPDR: DELAYED_RELEASE_CAPSULE | ORAL | Status: DC

## 2013-09-29 MED ORDER — SIMVASTATIN 20 MG PO TABS: 20.0000 mg | ORAL_TABLET | Freq: Every evening | ORAL | Status: DC

## 2013-09-29 MED ORDER — BETAMETHASONE DIPROPIONATE 0.05 % EX CREA: TOPICAL_CREAM | Freq: Every day | CUTANEOUS | Status: DC

## 2013-09-29 NOTE — Progress Notes (Signed)
Patient ID: [name] [name], female   DOB: Jul 23, 1945, 68 y.o.   MRN: [id number]    Chief Complaint  Patient presents with  . Annual Exam    Yearly check-up   No Known Allergies  HPI 68 y/o female patient is here for her annual exam. She has history of osteoporosis, GERD, hypothyroidism, prediabetes among others. She complaints of pain in her knees bothering her. No fall or trauma. Has hx of right TKA and left severe OA. No toehr complaints Has been walking for exercise Has cut down on fried food  Review of Systems  Constitutional: Negative for fever, chills, weight loss, malaise/fatigue and diaphoresis.  HENT: Negative for congestion, hearing loss and sore throat.   Eyes: Negative for blurred vision, double vision and discharge.  Respiratory: Negative for cough, sputum production, shortness of breath and wheezing.   Cardiovascular: Negative for chest pain, palpitations, orthopnea and leg swelling.  Gastrointestinal: Negative for heartburn, nausea, vomiting, abdominal pain, diarrhea and constipation.  Genitourinary: Negative for dysuria, urgency, frequency and flank pain.  Musculoskeletal: Negative for back pain, falls,myalgias.  Skin: Negative for itching and rash.  Neurological: Negative for dizziness, tingling, focal weakness and headaches.  Psychiatric/Behavioral: Negative for depression and memory loss and insomnia. The patient is not nervous/anxious.    Past Medical History  Diagnosis Date  . Hypothyroidism   . Headache(784.0)     WHEN REFLUX SEVERE  . GERD (gastroesophageal reflux disease)     PT STATES SEVERE REFLUX IF SHE DOES NOT TAKE HER OMEPRAZOLE  . Arthritis     OA AND PAIN RIGHT KNEE  . Vegetarian diet   . Osteoarthrosis, unspecified whether generalized or localized, lower leg   . Degeneration of intervertebral disc, site unspecified   . Other specified pre-operative examination 11/07/2011  . Unspecified vitamin D deficiency   . Thoracic or lumbosacral neuritis  or radiculitis, unspecified   . Encounter for long-term (current) use of other medications   . Long term (current) use of anticoagulants   . Osteoporosis, unspecified   . Disorder of bone and cartilage, unspecified   . Acute bronchiolitis due to respiratory syncytial virus (RSV)   . Acute bronchiolitis due to other infectious organisms   . Other atopic dermatitis and related conditions   . Other malaise and fatigue   . Disturbance of skin sensation   . Other and unspecified hyperlipidemia    Past Surgical History  Procedure Laterality Date  . Teeth extractions--no other surgery    . Total knee arthroplasty  11/28/2011    Procedure: TOTAL KNEE ARTHROPLASTY;  Surgeon: Jacki Cones, MD;  Location: [facility];  Service: Orthopedics;  Laterality: Right;  . Tubal ligation     Current Outpatient Prescriptions on File Prior to Visit  Medication Sig Dispense Refill  . albuterol (PROVENTIL HFA;VENTOLIN HFA) 108 (90 BASE) MCG/ACT inhaler Inhale 2 puffs into the lungs every 6 (six) hours as needed for wheezing or shortness of breath.  1 Inhaler  0  . aspirin 81 MG tablet Take 81 mg by mouth daily.      . calcium carbonate (OS-CAL) 600 MG TABS Take 600 mg by mouth 2 (two) times daily with a meal. Take one tablet twice a day for calcium supplement      . denosumab (PROLIA) 60 MG/ML SOLN injection Inject 60 mg into the skin every 6 (six) months. Administer in upper arm, thigh, or abdomen  1 mL  0  . meloxicam (MOBIC) 15 MG tablet Take one tablet once  daily for leg pain  90 tablet  3   No current facility-administered medications on file prior to visit.   History   Social History  . Marital Status: Married    Spouse Name: N/A    Number of Children: N/A  . Years of Education: N/A   Occupational History  . Not on file.   Social History Main Topics  . Smoking status: Never Smoker   . Smokeless tobacco: Never Used  . Alcohol Use: No  . Drug Use: No  . Sexual Activity: Not on file   Other  Topics Concern  . Not on file   Social History Narrative  . No narrative on file   Family History  Problem Relation Age of Onset  . Alport syndrome Brother    Physical exam BP 140/88  Pulse 83  Temp(Src) 97.4 F (36.3 C) (Oral)  Resp 10  Ht 4' 10.5" (1.486 m)  Wt 130 lb (58.968 kg)  BMI 26.70 kg/m2  SpO2 98%  General- elderly female in no acute distress Head- atraumatic, normocephalic Eyes- PERRLA, EOMI, no pallor, no icterus, no discharge Ears- left ear normal tympanic membrane and normal external ear canal , right ear normal tympanic membrane and normal external ear canal Neck- no lymphadenopathy, no thyromegaly, no jugular vein distension, no carotid bruit Nose- normal nasal mucosa, no maxillary sinus tenderness, no frontal sinus tenderness Mouth- normal mucus membrane, no oral thrush, normal oropharynx Chest- no chest wall deformities, no chest wall tenderness Breast- no masses, no palpable lumps, normal nipple and areola exam, no axillary lymphadenopathy Cardiovascular- normal s1,s2, no murmurs/ rubs/ gallops, normal distal pulses Respiratory- bilateral clear to auscultation, no wheeze, no rhonchi, no crackles Abdomen- bowel sounds present, soft, non tender, no organomegaly, no abdominal bruits, no guarding or rigidity, no CVA tenderness Pelvic exam- refused, pap smear not required with her age Musculoskeletal- able to move all 4 extremities, no spinal and paraspinal tenderness, steady gait, no use of assistive device, no leg edema Neurological- no focal deficit, normal reflexes, normal muscle strength, normal sensation to fine touch and vibration Skin- warm and dry Psychiatry- alert and oriented to person, place and time, normal mood and affect  Labs Lab Results  Component Value Date   WBC 7.6 05/29/2013   HGB 11.4 05/29/2013   HCT 35.3 05/29/2013   MCV 82 05/29/2013   PLT 241 12/01/2011   CMP     Component Value Date/Time   NA 139 05/29/2013 0826   NA 132*  11/30/2011 0410   K 4.5 05/29/2013 0826   CL 100 05/29/2013 0826   CO2 21 05/29/2013 0826   GLUCOSE 104* 05/29/2013 0826   GLUCOSE 139* 11/30/2011 0410   BUN 11 05/29/2013 0826   BUN 5* 11/30/2011 0410   CREATININE 0.63 05/29/2013 0826   CALCIUM 9.8 05/29/2013 0826   PROT 6.9 05/29/2013 0826   PROT 7.6 11/22/2011 1130   ALBUMIN 4.2 11/22/2011 1130   AST 26 05/29/2013 0826   ALT 22 05/29/2013 0826   ALKPHOS 73 05/29/2013 0826   BILITOT 0.4 05/29/2013 0826   GFRNONAA 92 05/29/2013 0826   GFRAA 106 05/29/2013 0826   Lipid Panel     Component Value Date/Time   TRIG 134 05/29/2013 0826   HDL 61 05/29/2013 0826   CHOLHDL 2.9 05/29/2013 0826   LDLCALC 87 05/29/2013 0826   Lab Results  Component Value Date   HGBA1C 5.9* 02/16/2013    03/03/13 dexa scan  FINDINGS: AP LUMBAR SPINE (L1-L4)  Bone Mineral Density (BMD):  0.771 g/cm2   Young Adult T Score:  -2.5   Z Score:  -0.4   LEFT FEMUR (NECK)   Bone Mineral Density (BMD):  0.700 g/cm2   Young Adult T Score: -1.3   Z Score:  0.4   ASSESSMENT: Patient's diagnostic category is OSTEOPOROSIS by WHO Criteria.  Assessment/plan  1. Osteoporosis, post-menopausal Continue prolia q6 month with oscal. Weight bearing exercise encouraged. Fall precasutions - Vitamin D, 1,25-dihydroxy  2. Osteoarthritis of left knee Continue meloxicam for now  3. Psoriasis Betamethasone cream refill provided, improved skin  4. Prediabetes - Hemoglobin A1c - Pneumococcal conjugate vaccine 13-valent  5. Hypothyroidism Continue levothyroxine 25 mcg daily. Check tsh today - TSH  6. GERD (gastroesophageal reflux disease) Continue omeprazole, have tried weaning her off it in past with recurrence of the symptom - CBC with Differential  7. Cough variant asthma Stable on prn albuterol, has not required it recently  8. Hyperlipidemia LDL goal <130 Stable on zocor, monitor clinically - CMP  9. Unspecified vitamin D deficiency Continue calcium  supplement - Vitamin D, 1,25-dihydroxy  10. Routine general medical examination at a health care facility - MM Digital Screening; Future - Pneumococcal conjugate vaccine 13-valent uptodate with dexa scan. Labs ordered today  11. Need for prophylactic vaccination against Streptococcus pneumoniae (pneumococcus) - Pneumococcal conjugate vaccine 13-valent

## 2013-09-30 LAB — CBC WITH DIFFERENTIAL/PLATELET
BASOS: 1 %
Basophils Absolute: 0.1 10*3/uL (ref 0.0–0.2)
EOS ABS: 0.1 10*3/uL (ref 0.0–0.4)
Eos: 1 %
HCT: 32.9 % — ABNORMAL LOW (ref 34.0–46.6)
HEMOGLOBIN: 10.7 g/dL — AB (ref 11.1–15.9)
Immature Grans (Abs): 0 10*3/uL (ref 0.0–0.1)
Immature Granulocytes: 0 %
Lymphocytes Absolute: 2.8 10*3/uL (ref 0.7–3.1)
Lymphs: 37 %
MCH: 26.4 pg — AB (ref 26.6–33.0)
MCHC: 32.5 g/dL (ref 31.5–35.7)
MCV: 81 fL (ref 79–97)
MONOS ABS: 0.7 10*3/uL (ref 0.1–0.9)
Monocytes: 9 %
NEUTROS ABS: 3.9 10*3/uL (ref 1.4–7.0)
Neutrophils Relative %: 52 %
RBC: 4.06 x10E6/uL (ref 3.77–5.28)
RDW: 15.1 % (ref 12.3–15.4)
WBC: 7.6 10*3/uL (ref 3.4–10.8)

## 2013-09-30 LAB — HEMOGLOBIN A1C
Est. average glucose Bld gHb Est-mCnc: 134 mg/dL
Hgb A1c MFr Bld: 6.3 % — ABNORMAL HIGH (ref 4.8–5.6)

## 2013-09-30 LAB — COMPREHENSIVE METABOLIC PANEL
ALBUMIN: 4.2 g/dL (ref 3.5–4.8)
ALK PHOS: 73 IU/L (ref 39–117)
ALT: 25 IU/L (ref 0–32)
AST: 28 IU/L (ref 0–40)
Albumin/Globulin Ratio: 1.8 (ref 1.1–2.5)
BUN / CREAT RATIO: 21 (ref 11–26)
BUN: 11 mg/dL (ref 8–27)
CALCIUM: 9.1 mg/dL (ref 8.7–10.3)
CO2: 21 mmol/L (ref 18–29)
CREATININE: 0.52 mg/dL — AB (ref 0.57–1.00)
Chloride: 104 mmol/L (ref 97–108)
GFR calc Af Amer: 112 mL/min/{1.73_m2} (ref >59)
GFR calc non Af Amer: 97 mL/min/{1.73_m2} (ref >59)
GLOBULIN, TOTAL: 2.4 g/dL (ref 1.5–4.5)
Glucose: 101 mg/dL — ABNORMAL HIGH (ref 65–99)
Potassium: 4.6 mmol/L (ref 3.5–5.2)
Sodium: 140 mmol/L (ref 134–144)
Total Bilirubin: 0.3 mg/dL (ref 0.0–1.2)
Total Protein: 6.6 g/dL (ref 6.0–8.5)

## 2013-09-30 LAB — VITAMIN D 1,25 DIHYDROXY: VIT D 1 25 DIHYDROXY: 49 pg/mL (ref 19.9–79.3)

## 2013-09-30 LAB — TSH: TSH: 3.4 u[IU]/mL (ref 0.450–4.500)

## 2013-10-16 ENCOUNTER — Ambulatory Visit: Payer: Medicare Other

## 2013-10-19 ENCOUNTER — Ambulatory Visit
Admission: RE | Admit: 2013-10-19 | Discharge: 2013-10-19 | Disposition: A | Discharge status: Home / Self Care | Payer: Medicare Other | Source: Ambulatory Visit | Attending: Internal Medicine | Admitting: Internal Medicine

## 2013-10-19 DIAGNOSIS — Z1231 Encounter for screening mammogram for malignant neoplasm of breast: Secondary | ICD-10-CM

## 2013-10-20 ENCOUNTER — Encounter: Payer: Self-pay | Admitting: *Deleted

## 2013-12-01 ENCOUNTER — Ambulatory Visit: Payer: Medicare Other

## 2013-12-03 ENCOUNTER — Ambulatory Visit (INDEPENDENT_AMBULATORY_CARE_PROVIDER_SITE_OTHER): Payer: Medicare Other

## 2013-12-03 DIAGNOSIS — M810 Age-related osteoporosis without current pathological fracture: Secondary | ICD-10-CM

## 2013-12-03 MED ORDER — DENOSUMAB 60 MG/ML ~~LOC~~ SOLN
60.0000 mg | Freq: Once | SUBCUTANEOUS | Status: AC
  Administered 2013-12-03: 60 mg via SUBCUTANEOUS

## 2014-02-02 ENCOUNTER — Ambulatory Visit: Payer: Medicare Other

## 2014-02-09 ENCOUNTER — Ambulatory Visit (INDEPENDENT_AMBULATORY_CARE_PROVIDER_SITE_OTHER): Payer: Medicare Other

## 2014-02-09 DIAGNOSIS — Z23 Encounter for immunization: Secondary | ICD-10-CM

## 2014-04-06 ENCOUNTER — Ambulatory Visit (INDEPENDENT_AMBULATORY_CARE_PROVIDER_SITE_OTHER): Payer: Medicare Other | Admitting: Internal Medicine

## 2014-04-06 ENCOUNTER — Encounter: Payer: Self-pay | Admitting: Internal Medicine

## 2014-04-06 VITALS — BP 122/80 | HR 90 | Temp 98.2°F | Resp 10 | Ht 59.0 in | Wt 128.6 lb

## 2014-04-06 DIAGNOSIS — R202 Paresthesia of skin: Secondary | ICD-10-CM

## 2014-04-06 DIAGNOSIS — K219 Gastro-esophageal reflux disease without esophagitis: Secondary | ICD-10-CM

## 2014-04-06 DIAGNOSIS — J45991 Cough variant asthma: Secondary | ICD-10-CM

## 2014-04-06 DIAGNOSIS — R7303 Prediabetes: Secondary | ICD-10-CM

## 2014-04-06 DIAGNOSIS — E039 Hypothyroidism, unspecified: Secondary | ICD-10-CM

## 2014-04-06 DIAGNOSIS — R200 Anesthesia of skin: Secondary | ICD-10-CM

## 2014-04-06 DIAGNOSIS — M810 Age-related osteoporosis without current pathological fracture: Secondary | ICD-10-CM

## 2014-04-06 DIAGNOSIS — E785 Hyperlipidemia, unspecified: Secondary | ICD-10-CM

## 2014-04-06 DIAGNOSIS — R7309 Other abnormal glucose: Secondary | ICD-10-CM

## 2014-04-06 MED ORDER — ALBUTEROL SULFATE HFA 108 (90 BASE) MCG/ACT IN AERS: 2.0000 | INHALATION_SPRAY | Freq: Four times a day (QID) | RESPIRATORY_TRACT | Status: DC | PRN

## 2014-04-06 NOTE — Progress Notes (Signed)
Patient ID: [name] [name], female   DOB: 03/26/1946, 68 y.o.   MRN: [id number]    Chief Complaint  Patient presents with  . Medical Management of Chronic Issues    6 month follow-up, last labs 09/29/2013  . Leg Problem    Tingling o left leg x 3-4 months   . URI    Cough and congestion    No Known Allergies  HPI 68 y/o pt here for routine visit She had cough with congestion on and off for 2 months, the cough was more at night. She took theraflu and noticed relief. Now has minimal dry cough. Denies congestion anymore. Denies sore throat. Has ran out of her proventil The tingling in her left thigh has started for 3-4 months now. It had resolved but now has restarted. It is more prominent at night. Denies any back pain. Denies any leg pain. Denies radiation of tingling to leg or foot. Complaint with her meds Due for prolia injection in feb Has been cycling for exercise  Review of Systems  Constitutional: Negative for fever, chills, weight loss, malaise/fatigue and diaphoresis.  HENT: Negative for congestion, hearing loss and sore throat.   Eyes: Negative for blurred vision, double vision and discharge.  Respiratory: Negative for cough, sputum production, shortness of breath and wheezing.   Cardiovascular: Negative for chest pain, palpitations, orthopnea and leg swelling.  Gastrointestinal: Negative for heartburn, nausea, vomiting, abdominal pain, diarrhea and constipation.  Genitourinary: Negative for dysuria, urgency, frequency and flank pain.  Musculoskeletal: Negative for back pain, falls,myalgias.  Skin: Negative for itching and rash.  Neurological: Negative for dizziness, tingling, focal weakness and headaches.  Psychiatric/Behavioral: Negative for depression and memory loss and insomnia. The patient is not nervous/anxious.    Past Medical History  Diagnosis Date  . Hypothyroidism   . Headache(784.0)     WHEN REFLUX SEVERE  . GERD (gastroesophageal reflux disease)     PT  STATES SEVERE REFLUX IF SHE DOES NOT TAKE HER OMEPRAZOLE  . Arthritis     OA AND PAIN RIGHT KNEE  . Vegetarian diet   . Osteoarthrosis, unspecified whether generalized or localized, lower leg   . Degeneration of intervertebral disc, site unspecified   . Other specified pre-operative examination 11/07/2011  . Unspecified vitamin D deficiency   . Thoracic or lumbosacral neuritis or radiculitis, unspecified   . Encounter for long-term (current) use of other medications   . Long term (current) use of anticoagulants   . Osteoporosis, unspecified   . Disorder of bone and cartilage, unspecified   . Acute bronchiolitis due to respiratory syncytial virus (RSV)   . Acute bronchiolitis due to other infectious organisms   . Other atopic dermatitis and related conditions   . Other malaise and fatigue   . Disturbance of skin sensation   . Other and unspecified hyperlipidemia    Past Surgical History  Procedure Laterality Date  . Teeth extractions--no other surgery    . Total knee arthroplasty  11/28/2011    Procedure: TOTAL KNEE ARTHROPLASTY;  Surgeon: Jacki Conesonald A Gioffre, MD;  Location: [facility];  Service: Orthopedics;  Laterality: Right;  . Tubal ligation     Current Outpatient Prescriptions on File Prior to Visit  Medication Sig Dispense Refill  . aspirin 81 MG tablet Take 81 mg by mouth daily.    . betamethasone dipropionate (DIPROLENE) 0.05 % cream Apply topically daily. 45 g 5  . calcium carbonate (OS-CAL) 600 MG TABS Take 600 mg by mouth 2 (two) times daily  with a meal. Take one tablet twice a day for calcium supplement    . denosumab (PROLIA) 60 MG/ML SOLN injection Inject 60 mg into the skin every 6 (six) months. Administer in upper arm, thigh, or abdomen 1 mL 0  . levothyroxine (SYNTHROID, LEVOTHROID) 25 MCG tablet Take 1 tablet (25 mcg total) by mouth daily before breakfast. 90 tablet 3  . meloxicam (MOBIC) 15 MG tablet Take one tablet once daily for leg pain 90 tablet 3  . omeprazole  (PRILOSEC) 20 MG capsule TAKE ONE CAPSULE BY MOUTH EVERY DAY TO  REDUCE  STOMACH  ACID 90 capsule 3  . simvastatin (ZOCOR) 20 MG tablet Take 1 tablet (20 mg total) by mouth every evening. 90 tablet 3   No current facility-administered medications on file prior to visit.    Physical exam BP 122/80 mmHg  Pulse 90  Temp(Src) 98.2 F (36.8 C) (Oral)  Resp 10  Ht 4\' 11"  (1.499 m)  Wt 128 lb 9.6 oz (58.333 kg)  BMI 25.96 kg/m2  SpO2 99%  General- elderly female in no acute distress Head- atraumatic, normocephalic Eyes- PERRLA, EOMI, no pallor, no icterus, no discharge Neck- no lymphadenopathy Nose- normal nasal mucosa, no maxillary sinus tenderness, no frontal sinus tenderness Mouth- normal mucus membrane, no oral thrush, normal oropharynx Cardiovascular- normal s1,s2, no murmurs Respiratory- bilateral clear to auscultation, no wheeze, no rhonchi, no crackles Abdomen- bowel sounds present, soft, non tender Musculoskeletal- able to move all 4 extremities, no spinal and paraspinal tenderness, steady gait, no use of assistive device, no leg edema Neurological- no focal deficit, normal reflexes, normal muscle strength, normal sensation to fine touch and vibration Skin- warm and dry Psychiatry- alert and oriented to person, place and time, normal mood and affect  labs Lab Results  Component Value Date   TSH 3.400 09/29/2013   CBC Latest Ref Rng 09/29/2013 05/29/2013 11/11/2012  WBC 3.4 - 10.8 x10E3/uL 7.6 7.6 8.2  Hemoglobin 11.1 - 15.9 g/dL 10.7(L) 11.4 11.5  Hematocrit 34.0 - 46.6 % 32.9(L) 35.3 34.9  Platelets 150 - 400 K/uL - - -   CMP     Component Value Date/Time   NA 140 09/29/2013 0912   NA 132* 11/30/2011 0410   K 4.6 09/29/2013 0912   CL 104 09/29/2013 0912   CO2 21 09/29/2013 0912   GLUCOSE 101* 09/29/2013 0912   GLUCOSE 139* 11/30/2011 0410   BUN 11 09/29/2013 0912   BUN 5* 11/30/2011 0410   CREATININE 0.52* 09/29/2013 0912   CALCIUM 9.1 09/29/2013 0912   PROT 6.6  09/29/2013 0912   PROT 7.6 11/22/2011 1130   ALBUMIN 4.2 11/22/2011 1130   AST 28 09/29/2013 0912   ALT 25 09/29/2013 0912   ALKPHOS 73 09/29/2013 0912   BILITOT 0.3 09/29/2013 0912   GFRNONAA 97 09/29/2013 0912   GFRAA 112 09/29/2013 0912   Lab Results  Component Value Date   HGBA1C 6.3* 09/29/2013   Lipid Panel     Component Value Date/Time   TRIG 134 05/29/2013 0826   HDL 61 05/29/2013 0826   CHOLHDL 2.9 05/29/2013 0826   LDLCALC 87 05/29/2013 0826   Assessment/plan  1. Cough variant asthma - albuterol (PROVENTIL HFA;VENTOLIN HFA) 108 (90 BASE) MCG/ACT inhaler; Inhale 2 puffs into the lungs every 6 (six) hours as needed for wheezing or shortness of breath.  Dispense: 1 Inhaler; Refill: 3  2. Prediabetes Recheck a1c, weight loss encouraged - CMP - Hemoglobin A1c  3. Hypothyroidism, unspecified hypothyroidism type  Recheck thyroid panel, continue levothyroxine - CMP - CBC with Differential - TSH  4. Gastroesophageal reflux disease, esophagitis presence not specified Stable on current regimen of ppi  5. Osteoporosis Continue prolia injection  6. Hyperlipidemia LDL goal <130 Continue her zocor and monitor - Lipid Panel  7. Numbness and tingling of left leg Chronic problem, recurred, has had extensive workup and treatment in past without much relief and symptom later self resolved. Has recurred again. Advised not to do cycling with concern for nerve impingement. Monitor clinically. No pain. Sensation intact. Supportive management for now

## 2014-04-07 LAB — COMPREHENSIVE METABOLIC PANEL
A/G RATIO: 1.8 (ref 1.1–2.5)
ALT: 25 IU/L (ref 0–32)
AST: 28 IU/L (ref 0–40)
Albumin: 4.2 g/dL (ref 3.5–4.8)
Alkaline Phosphatase: 70 IU/L (ref 39–117)
BUN/Creatinine Ratio: 18 (ref 11–26)
BUN: 11 mg/dL (ref 8–27)
CALCIUM: 9.5 mg/dL (ref 8.7–10.3)
CO2: 25 mmol/L (ref 18–29)
CREATININE: 0.6 mg/dL (ref 0.57–1.00)
Chloride: 101 mmol/L (ref 97–108)
GFR calc Af Amer: 107 mL/min/{1.73_m2} (ref >59)
GFR, EST NON AFRICAN AMERICAN: 93 mL/min/{1.73_m2} (ref >59)
GLOBULIN, TOTAL: 2.4 g/dL (ref 1.5–4.5)
GLUCOSE: 71 mg/dL (ref 65–99)
Potassium: 4.6 mmol/L (ref 3.5–5.2)
Sodium: 140 mmol/L (ref 134–144)
Total Bilirubin: 0.4 mg/dL (ref 0.0–1.2)
Total Protein: 6.6 g/dL (ref 6.0–8.5)

## 2014-04-07 LAB — CBC WITH DIFFERENTIAL/PLATELET
BASOS ABS: 0.1 10*3/uL (ref 0.0–0.2)
Basos: 1 %
EOS ABS: 0.1 10*3/uL (ref 0.0–0.4)
EOS: 1 %
HCT: 34.1 % (ref 34.0–46.6)
Hemoglobin: 11 g/dL — ABNORMAL LOW (ref 11.1–15.9)
IMMATURE GRANS (ABS): 0 10*3/uL (ref 0.0–0.1)
IMMATURE GRANULOCYTES: 0 %
Lymphocytes Absolute: 2.8 10*3/uL (ref 0.7–3.1)
Lymphs: 36 %
MCH: 25.8 pg — ABNORMAL LOW (ref 26.6–33.0)
MCHC: 32.3 g/dL (ref 31.5–35.7)
MCV: 80 fL (ref 79–97)
MONOS ABS: 0.9 10*3/uL (ref 0.1–0.9)
Monocytes: 11 %
NEUTROS PCT: 51 %
Neutrophils Absolute: 3.9 10*3/uL (ref 1.4–7.0)
RBC: 4.27 x10E6/uL (ref 3.77–5.28)
RDW: 15.6 % — ABNORMAL HIGH (ref 12.3–15.4)
WBC: 7.7 10*3/uL (ref 3.4–10.8)

## 2014-04-07 LAB — LIPID PANEL
Chol/HDL Ratio: 2.9 ratio units (ref 0.0–4.4)
Cholesterol, Total: 180 mg/dL (ref 100–199)
HDL: 62 mg/dL (ref >39)
LDL Calculated: 89 mg/dL (ref 0–99)
Triglycerides: 146 mg/dL (ref 0–149)
VLDL Cholesterol Cal: 29 mg/dL (ref 5–40)

## 2014-04-07 LAB — TSH: TSH: 3.45 u[IU]/mL (ref 0.450–4.500)

## 2014-04-07 LAB — HEMOGLOBIN A1C
ESTIMATED AVERAGE GLUCOSE: 131 mg/dL
Hgb A1c MFr Bld: 6.2 % — ABNORMAL HIGH (ref 4.8–5.6)

## 2014-04-13 ENCOUNTER — Other Ambulatory Visit: Payer: Self-pay | Admitting: *Deleted

## 2014-04-13 DIAGNOSIS — J45991 Cough variant asthma: Secondary | ICD-10-CM

## 2014-04-13 MED ORDER — LEVOTHYROXINE SODIUM 25 MCG PO TABS: ORAL_TABLET | ORAL | Status: DC

## 2014-04-13 MED ORDER — OMEPRAZOLE 20 MG PO CPDR: DELAYED_RELEASE_CAPSULE | ORAL | Status: DC

## 2014-04-13 MED ORDER — ALBUTEROL SULFATE HFA 108 (90 BASE) MCG/ACT IN AERS: 2.0000 | INHALATION_SPRAY | Freq: Four times a day (QID) | RESPIRATORY_TRACT | Status: DC | PRN

## 2014-04-13 NOTE — Telephone Encounter (Signed)
Patient husband called and stated that pharmacy never received medication. [facility]

## 2014-05-07 HISTORY — PX: EYE SURGERY: SHX253

## 2014-06-10 ENCOUNTER — Ambulatory Visit (INDEPENDENT_AMBULATORY_CARE_PROVIDER_SITE_OTHER): Payer: Medicare Other

## 2014-06-10 DIAGNOSIS — M810 Age-related osteoporosis without current pathological fracture: Secondary | ICD-10-CM

## 2014-06-10 MED ORDER — DENOSUMAB 60 MG/ML ~~LOC~~ SOLN
60.0000 mg | Freq: Once | SUBCUTANEOUS | Status: AC
  Administered 2014-06-10: 60 mg via SUBCUTANEOUS

## 2014-06-29 ENCOUNTER — Encounter: Payer: Self-pay | Admitting: Internal Medicine

## 2014-06-29 ENCOUNTER — Other Ambulatory Visit: Payer: Self-pay | Admitting: *Deleted

## 2014-06-29 MED ORDER — SIMVASTATIN 20 MG PO TABS: 20.0000 mg | ORAL_TABLET | Freq: Every evening | ORAL | Status: DC

## 2014-07-12 DIAGNOSIS — Z96651 Presence of right artificial knee joint: Secondary | ICD-10-CM | POA: Diagnosis not present

## 2014-07-12 DIAGNOSIS — Z471 Aftercare following joint replacement surgery: Secondary | ICD-10-CM | POA: Diagnosis not present

## 2014-09-14 ENCOUNTER — Other Ambulatory Visit: Payer: Self-pay

## 2014-09-14 DIAGNOSIS — E039 Hypothyroidism, unspecified: Secondary | ICD-10-CM

## 2014-09-14 DIAGNOSIS — E785 Hyperlipidemia, unspecified: Secondary | ICD-10-CM

## 2014-09-14 DIAGNOSIS — R739 Hyperglycemia, unspecified: Secondary | ICD-10-CM

## 2014-10-19 ENCOUNTER — Encounter: Payer: Medicaid Other | Admitting: Internal Medicine

## 2014-10-20 ENCOUNTER — Encounter: Payer: Medicaid Other | Admitting: Internal Medicine

## 2014-10-22 ENCOUNTER — Encounter: Payer: Medicaid Other | Admitting: Internal Medicine

## 2014-11-11 DIAGNOSIS — M5136 Other intervertebral disc degeneration, lumbar region: Secondary | ICD-10-CM | POA: Diagnosis not present

## 2014-11-11 DIAGNOSIS — M5386 Other specified dorsopathies, lumbar region: Secondary | ICD-10-CM | POA: Diagnosis not present

## 2014-11-12 ENCOUNTER — Other Ambulatory Visit: Payer: Self-pay

## 2014-11-15 ENCOUNTER — Other Ambulatory Visit: Payer: Medicare Other

## 2014-11-15 DIAGNOSIS — E039 Hypothyroidism, unspecified: Secondary | ICD-10-CM | POA: Diagnosis not present

## 2014-11-15 DIAGNOSIS — E785 Hyperlipidemia, unspecified: Secondary | ICD-10-CM

## 2014-11-15 DIAGNOSIS — R7309 Other abnormal glucose: Secondary | ICD-10-CM | POA: Diagnosis not present

## 2014-11-15 DIAGNOSIS — R739 Hyperglycemia, unspecified: Secondary | ICD-10-CM

## 2014-11-16 LAB — COMPREHENSIVE METABOLIC PANEL
A/G RATIO: 1.6 (ref 1.1–2.5)
ALT: 23 IU/L (ref 0–32)
AST: 23 IU/L (ref 0–40)
Albumin: 4 g/dL (ref 3.5–4.8)
Alkaline Phosphatase: 65 IU/L (ref 39–117)
BILIRUBIN TOTAL: 0.4 mg/dL (ref 0.0–1.2)
BUN/Creatinine Ratio: 15 (ref 11–26)
BUN: 9 mg/dL (ref 8–27)
CALCIUM: 8.7 mg/dL (ref 8.7–10.3)
CO2: 22 mmol/L (ref 18–29)
Chloride: 103 mmol/L (ref 97–108)
Creatinine, Ser: 0.61 mg/dL (ref 0.57–1.00)
GFR calc Af Amer: 105 mL/min/{1.73_m2} (ref >59)
GFR, EST NON AFRICAN AMERICAN: 92 mL/min/{1.73_m2} (ref >59)
GLUCOSE: 105 mg/dL — AB (ref 65–99)
Globulin, Total: 2.5 g/dL (ref 1.5–4.5)
POTASSIUM: 4.5 mmol/L (ref 3.5–5.2)
SODIUM: 139 mmol/L (ref 134–144)
Total Protein: 6.5 g/dL (ref 6.0–8.5)

## 2014-11-16 LAB — CBC WITH DIFFERENTIAL/PLATELET
BASOS: 2 %
Basophils Absolute: 0.1 10*3/uL (ref 0.0–0.2)
EOS (ABSOLUTE): 0.1 10*3/uL (ref 0.0–0.4)
EOS: 1 %
HEMATOCRIT: 31 % — AB (ref 34.0–46.6)
HEMOGLOBIN: 10.3 g/dL — AB (ref 11.1–15.9)
IMMATURE GRANS (ABS): 0 10*3/uL (ref 0.0–0.1)
IMMATURE GRANULOCYTES: 0 %
LYMPHS: 40 %
Lymphocytes Absolute: 3.1 10*3/uL (ref 0.7–3.1)
MCH: 25.6 pg — ABNORMAL LOW (ref 26.6–33.0)
MCHC: 33.2 g/dL (ref 31.5–35.7)
MCV: 77 fL — ABNORMAL LOW (ref 79–97)
MONOCYTES: 12 %
Monocytes Absolute: 0.9 10*3/uL (ref 0.1–0.9)
NEUTROS ABS: 3.5 10*3/uL (ref 1.4–7.0)
NEUTROS PCT: 45 %
Platelets: 346 10*3/uL (ref 150–379)
RBC: 4.03 x10E6/uL (ref 3.77–5.28)
RDW: 15.9 % — AB (ref 12.3–15.4)
WBC: 7.8 10*3/uL (ref 3.4–10.8)

## 2014-11-16 LAB — HEMOGLOBIN A1C
ESTIMATED AVERAGE GLUCOSE: 131 mg/dL
Hgb A1c MFr Bld: 6.2 % — ABNORMAL HIGH (ref 4.8–5.6)

## 2014-11-16 LAB — TSH: TSH: 5.66 u[IU]/mL — ABNORMAL HIGH (ref 0.450–4.500)

## 2014-11-16 LAB — LIPID PANEL
CHOL/HDL RATIO: 3.4 ratio (ref 0.0–4.4)
CHOLESTEROL TOTAL: 182 mg/dL (ref 100–199)
HDL: 53 mg/dL (ref >39)
LDL CALC: 99 mg/dL (ref 0–99)
Triglycerides: 151 mg/dL — ABNORMAL HIGH (ref 0–149)
VLDL Cholesterol Cal: 30 mg/dL (ref 5–40)

## 2014-11-17 ENCOUNTER — Other Ambulatory Visit: Payer: Self-pay

## 2014-11-17 MED ORDER — LEVOTHYROXINE SODIUM 50 MCG PO TABS: 50.0000 ug | ORAL_TABLET | Freq: Every day | ORAL | Status: DC

## 2014-11-19 ENCOUNTER — Encounter: Payer: Self-pay | Admitting: Internal Medicine

## 2014-11-19 ENCOUNTER — Ambulatory Visit (INDEPENDENT_AMBULATORY_CARE_PROVIDER_SITE_OTHER): Payer: Medicare Other | Admitting: Internal Medicine

## 2014-11-19 VITALS — BP 122/86 | HR 91 | Temp 97.9°F | Resp 20 | Ht 59.0 in | Wt 129.6 lb

## 2014-11-19 DIAGNOSIS — E039 Hypothyroidism, unspecified: Secondary | ICD-10-CM

## 2014-11-19 DIAGNOSIS — Z1239 Encounter for other screening for malignant neoplasm of breast: Secondary | ICD-10-CM | POA: Diagnosis not present

## 2014-11-19 DIAGNOSIS — Z0000 Encounter for general adult medical examination without abnormal findings: Secondary | ICD-10-CM

## 2014-11-19 DIAGNOSIS — M810 Age-related osteoporosis without current pathological fracture: Secondary | ICD-10-CM

## 2014-11-19 DIAGNOSIS — M2550 Pain in unspecified joint: Secondary | ICD-10-CM | POA: Insufficient documentation

## 2014-11-19 DIAGNOSIS — Z124 Encounter for screening for malignant neoplasm of cervix: Secondary | ICD-10-CM | POA: Diagnosis not present

## 2014-11-19 DIAGNOSIS — R7309 Other abnormal glucose: Secondary | ICD-10-CM

## 2014-11-19 DIAGNOSIS — R7303 Prediabetes: Secondary | ICD-10-CM

## 2014-11-19 DIAGNOSIS — E785 Hyperlipidemia, unspecified: Secondary | ICD-10-CM

## 2014-11-19 MED ORDER — SIMVASTATIN 20 MG PO TABS: 20.0000 mg | ORAL_TABLET | Freq: Every evening | ORAL | Status: DC

## 2014-11-19 MED ORDER — OMEPRAZOLE 20 MG PO CPDR: DELAYED_RELEASE_CAPSULE | ORAL | Status: DC

## 2014-11-19 NOTE — Progress Notes (Signed)
language barrier

## 2014-11-19 NOTE — Progress Notes (Signed)
Patient ID: [name] [name], female   DOB: Mar 09, 1946, 69 y.o.   MRN: [id number] Subjective:     [name] [name] is a 69 y.o. female and is here for a comprehensive physical exam. The patient reports no problems.  She is eating and sleeping well. She gets adequate exercise but no set routine. No falls. No depression. She plans to visit her daughter in [place] in August and then will travel to [place] later this year for 6 mos.  Thyroid stable on levothyroxine. No CP, palpitations, fatigue, SOB, change in bowel habits  She takes statin for cholesterol. No myalgias  Joint pain controlled on meloxicam  She gets prolia injection q48mos for osteoporosis. Next one is due in August  Translator present  History   Past Medical History  Diagnosis Date  . Hypothyroidism   . Headache(784.0)     WHEN REFLUX SEVERE  . GERD (gastroesophageal reflux disease)     PT STATES SEVERE REFLUX IF SHE DOES NOT TAKE HER OMEPRAZOLE  . Arthritis     OA AND PAIN RIGHT KNEE  . Vegetarian diet   . Osteoarthrosis, unspecified whether generalized or localized, lower leg   . Degeneration of intervertebral disc, site unspecified   . Other specified pre-operative examination 11/07/2011  . Unspecified vitamin D deficiency   . Thoracic or lumbosacral neuritis or radiculitis, unspecified   . Encounter for long-term (current) use of other medications   . Long term (current) use of anticoagulants   . Osteoporosis, unspecified   . Disorder of bone and cartilage, unspecified   . Acute bronchiolitis due to respiratory syncytial virus (RSV)   . Acute bronchiolitis due to other infectious organisms   . Other atopic dermatitis and related conditions   . Other malaise and fatigue   . Disturbance of skin sensation   . Other and unspecified hyperlipidemia    Past Surgical History  Procedure Laterality Date  . Teeth extractions--no other surgery    . Total knee arthroplasty  11/28/2011    Procedure: TOTAL KNEE ARTHROPLASTY;   Surgeon: Jacki Cones, MD;  Location: [facility];  Service: Orthopedics;  Laterality: Right;  . Tubal ligation     Family History  Problem Relation Age of Onset  . Alport syndrome Brother     Social History  . Marital Status: Married    Spouse Name: N/A  . Number of Children: N/A  . Years of Education: N/A   Occupational History  . Not on file.   Social History Main Topics  . Smoking status: Never Smoker   . Smokeless tobacco: Never Used  . Alcohol Use: No  . Drug Use: No  . Sexual Activity: Not on file   Other Topics Concern  . Not on file   Social History Narrative   Health Maintenance  Topic Date Due  . Janet Berlin  07/06/1962  . COLONOSCOPY  07/05/1993  . ZOSTAVAX  07/06/2003  . PNA vac Low Risk Adult (2 of 2 - PPSV23) 09/30/2014  . INFLUENZA VACCINE  12/06/2014  . MAMMOGRAM  10/20/2015  . DEXA SCAN  Completed    Review of Systems   Review of Systems  Constitutional: Negative for fever, chills and malaise/fatigue.  HENT: Negative for sore throat and tinnitus.   Eyes: Negative for blurred vision and double vision.  Respiratory: Negative for cough, shortness of breath and wheezing.   Cardiovascular: Negative for chest pain, palpitations, orthopnea and leg swelling.  Gastrointestinal: Negative for heartburn, nausea, vomiting, abdominal pain, diarrhea, constipation  and blood in stool.  Genitourinary: Negative for dysuria, urgency, frequency and hematuria.  Musculoskeletal: Positive for joint pain (right knee). Negative for myalgias and falls.  Skin: Negative for rash.  Neurological: Negative for dizziness, tingling, tremors, sensory change, focal weakness, seizures, loss of consciousness, weakness and headaches.  Endo/Heme/Allergies: Negative for environmental allergies. Does not bruise/bleed easily.  Psychiatric/Behavioral: Negative for depression and memory loss. The patient is not nervous/anxious and does not have insomnia.      Objective:      Physical  Exam  Constitutional: She is oriented to person, place, and time and well-developed, well-nourished, and in no distress.  HENT:  Head: Normocephalic and atraumatic.  Right Ear: External ear normal.  Left Ear: External ear normal.  Mouth/Throat: Oropharynx is clear and moist. No oropharyngeal exudate.  Eyes: Conjunctivae and EOM are normal. Pupils are equal, round, and reactive to light. No scleral icterus.  Neck: Normal range of motion. Neck supple. No tracheal deviation present. No thyromegaly present.  Cardiovascular: Normal rate, regular rhythm, normal heart sounds and intact distal pulses.  Exam reveals no gallop and no friction rub.   No murmur heard. Pulmonary/Chest: Effort normal and breath sounds normal. She has no wheezes. She has no rhonchi. She has no rales. She exhibits no tenderness. Right breast exhibits no inverted nipple, no mass, no nipple discharge, no skin change and no tenderness. Left breast exhibits no inverted nipple, no mass, no nipple discharge, no skin change and no tenderness. Breasts are symmetrical.  Abdominal: Soft. Bowel sounds are normal. She exhibits no distension and no mass. There is no hepatosplenomegaly. There is no tenderness. There is no rebound and no guarding.  Genitourinary: Vagina normal, uterus normal, cervix normal, right adnexa normal and left adnexa normal. Rectal exam shows no external hemorrhoid, no internal hemorrhoid, no fissure, no laceration, no mass and no tenderness. Guaiac negative stool. No vaginal discharge found.  Pap smear done. Chaperone present  Musculoskeletal: Normal range of motion. She exhibits edema and tenderness.  Right knee swelling and TTP. Gait antalgic  Lymphadenopathy:    She has no cervical adenopathy.  Neurological: She is alert and oriented to person, place, and time. She displays abnormal reflex (reduced right knee). No cranial nerve deficit.  Skin: Skin is warm and dry. No rash noted.  Psychiatric: Mood, memory,  affect and judgment normal.   MMSE - Mini Mental State Exam 11/19/2014  Orientation to time 4  Orientation to Place 5  Registration 3  Attention/ Calculation 5  Recall 3  Language- name 2 objects 2  Language- repeat 1  Language- follow 3 step command 3  Language- read & follow direction 1  Write a sentence 1  Copy design 1  Total score 29   Recent Results (from the past 2160 hour(s))  Lipid Panel     Status: Abnormal   Collection Time: 11/15/14  8:19 AM  Result Value Ref Range   Cholesterol, Total 182 100 - 199 mg/dL   Triglycerides 161151 (H) 0 - 149 mg/dL   HDL 53 >09>39 mg/dL    Comment: According to ATP-III Guidelines, HDL-C >59 mg/dL is considered a negative risk factor for CHD.    VLDL Cholesterol Cal 30 5 - 40 mg/dL   LDL Calculated 99 0 - 99 mg/dL   Chol/HDL Ratio 3.4 0.0 - 4.4 ratio units    Comment:  T. Chol/HDL Ratio                                             Men  Women                               1/2 Avg.Risk  3.4    3.3                                   Avg.Risk  5.0    4.4                                2X Avg.Risk  9.6    7.1                                3X Avg.Risk 23.4   11.0   TSH     Status: Abnormal   Collection Time: 11/15/14  8:19 AM  Result Value Ref Range   TSH 5.660 (H) 0.450 - 4.500 uIU/mL  CBC with Differential/Platelet     Status: Abnormal   Collection Time: 11/15/14  8:19 AM  Result Value Ref Range   WBC 7.8 3.4 - 10.8 x10E3/uL   RBC 4.03 3.77 - 5.28 x10E6/uL   Hemoglobin 10.3 (L) 11.1 - 15.9 g/dL   Hematocrit 78.2 (L) 95.6 - 46.6 %   MCV 77 (L) 79 - 97 fL   MCH 25.6 (L) 26.6 - 33.0 pg   MCHC 33.2 31.5 - 35.7 g/dL   RDW 21.3 (H) 08.6 - 57.8 %   Platelets 346 150 - 379 x10E3/uL   Neutrophils 45 %   Lymphs 40 %   Monocytes 12 %   Eos 1 %   Basos 2 %   Neutrophils Absolute 3.5 1.4 - 7.0 x10E3/uL   Lymphocytes Absolute 3.1 0.7 - 3.1 x10E3/uL   Monocytes Absolute 0.9 0.1 - 0.9 x10E3/uL   EOS (ABSOLUTE)  0.1 0.0 - 0.4 x10E3/uL   Basophils Absolute 0.1 0.0 - 0.2 x10E3/uL   Immature Granulocytes 0 %   Immature Grans (Abs) 0.0 0.0 - 0.1 x10E3/uL  CMP     Status: Abnormal   Collection Time: 11/15/14  8:19 AM  Result Value Ref Range   Glucose 105 (H) 65 - 99 mg/dL   BUN 9 8 - 27 mg/dL   Creatinine, Ser 4.69 0.57 - 1.00 mg/dL   GFR calc non Af Amer 92 >59 mL/min/1.73   GFR calc Af Amer 105 >59 mL/min/1.73   BUN/Creatinine Ratio 15 11 - 26   Sodium 139 134 - 144 mmol/L   Potassium 4.5 3.5 - 5.2 mmol/L   Chloride 103 97 - 108 mmol/L   CO2 22 18 - 29 mmol/L   Calcium 8.7 8.7 - 10.3 mg/dL   Total Protein 6.5 6.0 - 8.5 g/dL   Albumin 4.0 3.5 - 4.8 g/dL   Globulin, Total 2.5 1.5 - 4.5 g/dL   Albumin/Globulin Ratio 1.6 1.1 - 2.5   Bilirubin Total 0.4 0.0 - 1.2 mg/dL   Alkaline Phosphatase 65 39 - 117 IU/L   AST 23 0 - 40 IU/L   ALT  23 0 - 32 IU/L  Hemoglobin A1c     Status: Abnormal   Collection Time: 11/15/14  8:19 AM  Result Value Ref Range   Hgb A1c MFr Bld 6.2 (H) 4.8 - 5.6 %    Comment:          Pre-diabetes: 5.7 - 6.4          Diabetes: >6.4          Glycemic control for adults with diabetes: <7.0    Est. average glucose Bld gHb Est-mCnc 131 mg/dL        Assessment:    Healthy female exam.       ICD-9-CM ICD-10-CM   1. Well adult exam V70.0 Z00.00   2. Breast cancer screening V76.10 Z12.39 MM DIGITAL SCREENING BILATERAL  3. Cervical cancer screening V76.2 Z12.4 PAP, Image Guided [[facility], [facility]]     PAP, Image Guided [[facility], [facility]]     CANCELED: PAP, Image Guided [[facility], [facility]]  4. Hypothyroidism, unspecified hypothyroidism type - borderline controlled 244.9 E03.9 TSH  5. Prediabetes - stable 790.29 R73.09   6. Hyperlipidemia LDL goal <130 - stable 272.4 E78.5   7. Osteoporosis - on prolia q65mos 733.00 M81.0   8. Pain, joint, multiple sites - stable 719.49 M25.50     Plan:     See After Visit Summary for Counseling Recommendations   Pt is UTD on health  maintenance. Vaccinations are UTD. Pt maintains a healthy lifestyle. Encouraged pt to exercise 30-45 minutes 4-5 times per week. Eat a well balanced diet. Avoid smoking. Limit alcohol intake. Wear seatbelt when riding in the car. Wear sun block (SPF >50) when spending extended times outside.  RTO in 6 weeks to repeat TSH  Keep appt for Prolia injection  Continue current meds as ordered  Follow up in 6 mos for routine exam. Check fasting labs prior to appt (CMP, lipid panel, TSH and A1c)  Corby Villasenor S. Ancil Linsey  [facility] [address] [place], [place] [address] [phone] Cell (Monday-Friday 8 AM - 5 PM) [phone] After 5 PM and follow prompts

## 2014-11-19 NOTE — Patient Instructions (Addendum)
Encouraged her to exercise 30-45 minutes 4-5 times per week. Eat a well balanced diet. Avoid smoking. Limit alcohol intake. Wear seatbelt when riding in the car. Wear sun block (SPF >50) when spending extended times outside.  She will need to come back for repeat thyroid blood tests in 6 weeks. If stable, will send 90 day prescription to pharmacy for levothyroxine  Will call with mammogram appt  Will call with pap results  follow up in 6mos for routine visit

## 2014-11-23 ENCOUNTER — Other Ambulatory Visit: Payer: Self-pay | Admitting: *Deleted

## 2014-11-23 MED ORDER — OMEPRAZOLE 20 MG PO CPDR: DELAYED_RELEASE_CAPSULE | ORAL | Status: DC

## 2014-11-23 MED ORDER — SIMVASTATIN 20 MG PO TABS: ORAL_TABLET | ORAL | Status: DC

## 2014-11-23 MED ORDER — LEVOTHYROXINE SODIUM 50 MCG PO TABS: ORAL_TABLET | ORAL | Status: DC

## 2014-11-23 NOTE — Telephone Encounter (Signed)
[facility] 

## 2014-11-24 LAB — PAP IG (IMAGE GUIDED): PAP SMEAR COMMENT: 0

## 2014-11-29 ENCOUNTER — Other Ambulatory Visit: Payer: Self-pay | Admitting: Internal Medicine

## 2014-11-29 DIAGNOSIS — R87612 Low grade squamous intraepithelial lesion on cytologic smear of cervix (LGSIL): Secondary | ICD-10-CM

## 2014-12-03 DIAGNOSIS — H524 Presbyopia: Secondary | ICD-10-CM | POA: Diagnosis not present

## 2014-12-03 DIAGNOSIS — H25013 Cortical age-related cataract, bilateral: Secondary | ICD-10-CM | POA: Diagnosis not present

## 2014-12-03 DIAGNOSIS — H43813 Vitreous degeneration, bilateral: Secondary | ICD-10-CM | POA: Diagnosis not present

## 2014-12-03 DIAGNOSIS — H35342 Macular cyst, hole, or pseudohole, left eye: Secondary | ICD-10-CM | POA: Diagnosis not present

## 2014-12-03 DIAGNOSIS — H2513 Age-related nuclear cataract, bilateral: Secondary | ICD-10-CM | POA: Diagnosis not present

## 2014-12-06 ENCOUNTER — Telehealth: Payer: Self-pay | Admitting: Internal Medicine

## 2014-12-06 ENCOUNTER — Telehealth: Payer: Self-pay | Admitting: Obstetrics and Gynecology

## 2014-12-06 NOTE — Telephone Encounter (Signed)
Becky from [facility] called to let us know that the patient's husband called today and canceled patients appointment for 12/08/14 and did not want to reschedule

## 2014-12-06 NOTE — Telephone Encounter (Signed)
Noted  

## 2014-12-06 NOTE — Telephone Encounter (Signed)
Patient's spouse called to cancel referring appointment for Wednesday. Says he will call back to reschedule.

## 2014-12-08 ENCOUNTER — Encounter: Payer: Medicare Other | Admitting: Obstetrics and Gynecology

## 2014-12-08 DIAGNOSIS — H35373 Puckering of macula, bilateral: Secondary | ICD-10-CM | POA: Diagnosis not present

## 2014-12-08 DIAGNOSIS — H35342 Macular cyst, hole, or pseudohole, left eye: Secondary | ICD-10-CM | POA: Diagnosis not present

## 2014-12-08 DIAGNOSIS — H43813 Vitreous degeneration, bilateral: Secondary | ICD-10-CM | POA: Diagnosis not present

## 2014-12-09 ENCOUNTER — Ambulatory Visit (INDEPENDENT_AMBULATORY_CARE_PROVIDER_SITE_OTHER): Payer: Medicare Other | Admitting: *Deleted

## 2014-12-09 ENCOUNTER — Telehealth: Payer: Self-pay | Admitting: Internal Medicine

## 2014-12-09 DIAGNOSIS — M810 Age-related osteoporosis without current pathological fracture: Secondary | ICD-10-CM

## 2014-12-09 MED ORDER — DENOSUMAB 60 MG/ML ~~LOC~~ SOLN
60.0000 mg | Freq: Once | SUBCUTANEOUS | Status: DC
Start: 2014-12-09 — End: 2016-07-19

## 2014-12-09 MED ORDER — DENOSUMAB 60 MG/ML ~~LOC~~ SOLN
60.0000 mg | Freq: Once | SUBCUTANEOUS | Status: AC
  Administered 2014-12-09: 60 mg via SUBCUTANEOUS

## 2014-12-09 NOTE — Telephone Encounter (Signed)
Patient declined order for mammogram   CC'd Synetta Fail for data entry

## 2014-12-09 NOTE — Telephone Encounter (Signed)
Noted  

## 2014-12-10 NOTE — Telephone Encounter (Signed)
Documented in Health Maintenance.

## 2014-12-28 ENCOUNTER — Telehealth: Payer: Self-pay | Admitting: *Deleted

## 2014-12-28 NOTE — Telephone Encounter (Signed)
Form from [facility] for Prior Authorization for Prolia filled out and given to Dr. Montez Morita to review and sign. To be faxed to [phone]

## 2015-01-13 DIAGNOSIS — H35343 Macular cyst, hole, or pseudohole, bilateral: Secondary | ICD-10-CM | POA: Diagnosis not present

## 2015-01-17 ENCOUNTER — Other Ambulatory Visit: Payer: Medicare Other

## 2015-01-17 DIAGNOSIS — E039 Hypothyroidism, unspecified: Secondary | ICD-10-CM | POA: Diagnosis not present

## 2015-01-18 LAB — TSH: TSH: 2.07 u[IU]/mL (ref 0.450–4.500)

## 2015-01-19 ENCOUNTER — Ambulatory Visit (INDEPENDENT_AMBULATORY_CARE_PROVIDER_SITE_OTHER): Payer: Medicare Other | Admitting: Internal Medicine

## 2015-01-19 ENCOUNTER — Encounter: Payer: Self-pay | Admitting: Internal Medicine

## 2015-01-19 VITALS — BP 130/82 | HR 92 | Temp 98.3°F | Resp 20 | Ht 59.0 in | Wt 130.0 lb

## 2015-01-19 DIAGNOSIS — H2512 Age-related nuclear cataract, left eye: Secondary | ICD-10-CM | POA: Diagnosis not present

## 2015-01-19 DIAGNOSIS — J301 Allergic rhinitis due to pollen: Secondary | ICD-10-CM | POA: Diagnosis not present

## 2015-01-19 DIAGNOSIS — R42 Dizziness and giddiness: Secondary | ICD-10-CM

## 2015-01-19 DIAGNOSIS — Z23 Encounter for immunization: Secondary | ICD-10-CM

## 2015-01-19 DIAGNOSIS — M7072 Other bursitis of hip, left hip: Secondary | ICD-10-CM

## 2015-01-19 DIAGNOSIS — H25012 Cortical age-related cataract, left eye: Secondary | ICD-10-CM | POA: Diagnosis not present

## 2015-01-19 NOTE — Progress Notes (Signed)
Patient ID: [name] [name], female   DOB: 1946-04-11, 69 y.o.   MRN: [id number]    Location:    [facility]   Place of Service:  OFFICE   Chief Complaint  Patient presents with  . Acute Visit    Has been havung dizzy spells , problem with left thigh, labs printed  . Immunizations    flu shot    HPI:  69 yo female seen today for dizziness x 3 weeks. Sensation that the room is spinning when she turns on her side in bed. No interruption of sleep. No HA. No N/V. No f/c. No sore throat. No sinus pressure. Sx's not present when she is ambulating. She has not had any dizziness in the last 2 days  She has OS "hole" in retina and will have surgery to repair in next 1-2 mos.  She has pain in left hip that radiates doen side of le. Has seen ortho in the past  and had injection without relief. Pain worse with lying on left side. (+) tingling  Translator present  Past Medical History  Diagnosis Date  . Hypothyroidism   . Headache(784.0)     WHEN REFLUX SEVERE  . GERD (gastroesophageal reflux disease)     PT STATES SEVERE REFLUX IF SHE DOES NOT TAKE HER OMEPRAZOLE  . Arthritis     OA AND PAIN RIGHT KNEE  . Vegetarian diet   . Osteoarthrosis, unspecified whether generalized or localized, lower leg   . Degeneration of intervertebral disc, site unspecified   . Other specified pre-operative examination 11/07/2011  . Unspecified vitamin D deficiency   . Thoracic or lumbosacral neuritis or radiculitis, unspecified   . Encounter for long-term (current) use of other medications   . Long term (current) use of anticoagulants   . Osteoporosis, unspecified   . Disorder of bone and cartilage, unspecified   . Acute bronchiolitis due to respiratory syncytial virus (RSV)   . Acute bronchiolitis due to other infectious organisms   . Other atopic dermatitis and related conditions   . Other malaise and fatigue   . Disturbance of skin sensation   . Other and unspecified hyperlipidemia     Past Surgical  History  Procedure Laterality Date  . Teeth extractions--no other surgery    . Total knee arthroplasty  11/28/2011    Procedure: TOTAL KNEE ARTHROPLASTY;  Surgeon: Jacki Cones, MD;  Location: [facility];  Service: Orthopedics;  Laterality: Right;  . Tubal ligation      Patient Care Team: Kirt Boys, DO as PCP - General (Internal Medicine)  Social History   Social History  . Marital Status: Married    Spouse Name: N/A  . Number of Children: N/A  . Years of Education: N/A   Occupational History  . Not on file.   Social History Main Topics  . Smoking status: Never Smoker   . Smokeless tobacco: Never Used  . Alcohol Use: No  . Drug Use: No  . Sexual Activity: Not on file   Other Topics Concern  . Not on file   Social History Narrative     reports that she has never smoked. She has never used smokeless tobacco. She reports that she does not drink alcohol or use illicit drugs.  No Known Allergies  Medications: Patient's Medications  New Prescriptions   No medications on file  Previous Medications   ALBUTEROL (PROVENTIL HFA;VENTOLIN HFA) 108 (90 BASE) MCG/ACT INHALER    Inhale 2 puffs into the lungs every  6 (six) hours as needed for wheezing or shortness of breath.   ASPIRIN 81 MG TABLET    Take 81 mg by mouth daily.   BETAMETHASONE DIPROPIONATE (DIPROLENE) 0.05 % CREAM    Apply topically daily.   CALCIUM CARBONATE (OS-CAL) 600 MG TABS    Take 600 mg by mouth 2 (two) times daily with a meal. Take one tablet twice a day for calcium supplement   DENOSUMAB (PROLIA) 60 MG/ML SOLN INJECTION    Inject 60 mg into the skin once. Administer in upper arm, thigh, or abdomen   LEVOTHYROXINE (SYNTHROID, LEVOTHROID) 50 MCG TABLET    Take one tablet by mouth 30 minutes before breakfast once daily for thyroid   MELOXICAM (MOBIC) 15 MG TABLET    Take one tablet once daily for leg pain   OMEPRAZOLE (PRILOSEC) 20 MG CAPSULE    Take one capsule by mouth once daily to reduce stomach acid    SIMVASTATIN (ZOCOR) 20 MG TABLET    Take one tablet by mouth once daily for cholesterol  Modified Medications   No medications on file  Discontinued Medications   DENOSUMAB (PROLIA) 60 MG/ML SOLN INJECTION    Inject 60 mg into the skin every 6 (six) months. Administer in upper arm, thigh, or abdomen    Review of Systems  Constitutional: Negative for fever, chills, diaphoresis, activity change, appetite change and fatigue.  HENT: Negative for ear pain and sore throat.   Eyes: Negative for visual disturbance.  Respiratory: Negative for cough, chest tightness and shortness of breath.   Cardiovascular: Negative for chest pain, palpitations and leg swelling.  Gastrointestinal: Negative for nausea, vomiting, abdominal pain, diarrhea, constipation and blood in stool.  Genitourinary: Negative for dysuria.  Musculoskeletal: Positive for arthralgias.  Neurological: Positive for dizziness. Negative for tremors, numbness and headaches.  Psychiatric/Behavioral: Negative for sleep disturbance. The patient is not nervous/anxious.     Filed Vitals:   01/19/15 1341  BP: 130/82  Pulse: 92  Temp: 98.3 F (36.8 C)  TempSrc: Oral  Resp: 20  Height: 4\' 11"  (1.499 m)  Weight: 130 lb (58.968 kg)  SpO2: 97%   Body mass index is 26.24 kg/(m^2).  Physical Exam  Constitutional: She is oriented to person, place, and time.  HENT:  Mouth/Throat: Oropharynx is clear and moist. No oropharyngeal exudate.  Left TM dull, intact nonbulging no redness. Right TM intact and no redness or bulging. No sinus TTP. Oropharynx clear and without exudate. MMM  Eyes: Pupils are equal, round, and reactive to light.  Neck: Neck supple. No tracheal deviation present.  Cardiovascular: Normal rate, regular rhythm and intact distal pulses.  Exam reveals no gallop and no friction rub.   Murmur (1/6 SEM) heard. Pulmonary/Chest: Effort normal and breath sounds normal. No stridor. No respiratory distress. She has no wheezes. She  has no rales. She exhibits no tenderness.  Musculoskeletal: She exhibits tenderness. Edema: left greater trochanteric TTP with iliotibial band hypertrophy.  Lymphadenopathy:    She has no cervical adenopathy.  Neurological: She is alert and oriented to person, place, and time.  Neg Dix Hallpike maneuver  Skin: Skin is warm and dry. No rash noted.  Psychiatric: She has a normal mood and affect. Her behavior is normal. Thought content normal.     Labs reviewed: Appointment on 01/17/2015  Component Date Value Ref Range Status  . TSH 01/17/2015 2.070  0.450 - 4.500 uIU/mL Final  Office Visit on 11/19/2014  Component Date Value Ref Range Status  .  DIAGNOSIS: 11/19/2014 Comment*  Final   Comment: EPITHELIAL CELL ABNORMALITY. LOW-GRADE SQUAMOUS INTRAEPITHELIAL LESION, MILD DYSPLASIA, AT LEAST, IS PRESENT.  CELLS SUSPICIOUS FOR A HIGH-GRADE LESION ARE ALSO PRESENT. CELLULAR CHANGES ASSOCIATED WITH ATROPHY ARE PRESENT.   Marland Kitchen Recommendation: 11/19/2014 Comment*  Final   Suggest colposcopy and biopsy if indicated.  Marland Kitchen Specimen adequacy: 11/19/2014 Comment   Final   Comment: Satisfactory for evaluation. Endocervical and/or squamous metaplastic cells (endocervical component) are present.   Marland Kitchen CLINICIAN PROVIDED ICD10: 11/19/2014 Comment   Final   Z12.4  . Performed by: 11/19/2014 Comment   Final   Marga Hoots, Cytotechnologist (ASCP)  . Electronically signed by: 11/19/2014 Comment   Final   Jennette Bill, MD, Pathologist  . PAP SMEAR COMMENT 11/19/2014 .   Final  . PATHOLOGIST PROVIDED ICD10: 11/19/2014 Comment   Final   Z61.096  . Note: 11/19/2014 Comment   Final   Comment: The Pap smear is a screening test designed to aid in the detection of premalignant and malignant conditions of the uterine cervix.  It is not a diagnostic procedure and should not be used as the sole means of detecting cervical cancer.  Both false-positive and false-negative reports do occur.   . Test Methodology  11/19/2014 Comment   Final   Comment: This liquid based ThinPrep(R) pap test was screened with the use of an image guided system.   Appointment on 11/15/2014  Component Date Value Ref Range Status  . Cholesterol, Total 11/15/2014 182  100 - 199 mg/dL Final  . Triglycerides 11/15/2014 151* 0 - 149 mg/dL Final  . HDL 04/54/0981 53  >39 mg/dL Final   Comment: According to ATP-III Guidelines, HDL-C >59 mg/dL is considered a negative risk factor for CHD.   Marland Kitchen VLDL Cholesterol Cal 11/15/2014 30  5 - 40 mg/dL Final  . LDL Calculated 11/15/2014 99  0 - 99 mg/dL Final  . Chol/HDL Ratio 11/15/2014 3.4  0.0 - 4.4 ratio units Final   Comment:                                   T. Chol/HDL Ratio                                             Men  Women                               1/2 Avg.Risk  3.4    3.3                                   Avg.Risk  5.0    4.4                                2X Avg.Risk  9.6    7.1                                3X Avg.Risk 23.4   11.0   . TSH 11/15/2014 5.660* 0.450 - 4.500 uIU/mL Final  . WBC 11/15/2014 7.8  3.4 - 10.8  x10E3/uL Final  . RBC 11/15/2014 4.03  3.77 - 5.28 x10E6/uL Final  . Hemoglobin 11/15/2014 10.3* 11.1 - 15.9 g/dL Final  . Hematocrit 78/29/5621 31.0* 34.0 - 46.6 % Final  . MCV 11/15/2014 77* 79 - 97 fL Final  . MCH 11/15/2014 25.6* 26.6 - 33.0 pg Final  . MCHC 11/15/2014 33.2  31.5 - 35.7 g/dL Final  . RDW 30/86/5784 15.9* 12.3 - 15.4 % Final  . Platelets 11/15/2014 346  150 - 379 x10E3/uL Final  . Neutrophils 11/15/2014 45   Final  . Lymphs 11/15/2014 40   Final  . Monocytes 11/15/2014 12   Final  . Eos 11/15/2014 1   Final  . Basos 11/15/2014 2   Final  . Neutrophils Absolute 11/15/2014 3.5  1.4 - 7.0 x10E3/uL Final  . Lymphocytes Absolute 11/15/2014 3.1  0.7 - 3.1 x10E3/uL Final  . Monocytes Absolute 11/15/2014 0.9  0.1 - 0.9 x10E3/uL Final  . EOS (ABSOLUTE) 11/15/2014 0.1  0.0 - 0.4 x10E3/uL Final  . Basophils Absolute 11/15/2014 0.1  0.0  - 0.2 x10E3/uL Final  . Immature Granulocytes 11/15/2014 0   Final  . Immature Grans (Abs) 11/15/2014 0.0  0.0 - 0.1 x10E3/uL Final  . Glucose 11/15/2014 105* 65 - 99 mg/dL Final  . BUN 69/62/9528 9  8 - 27 mg/dL Final  . Creatinine, Ser 11/15/2014 0.61  0.57 - 1.00 mg/dL Final  . GFR calc non Af Amer 11/15/2014 92  >59 mL/min/1.73 Final  . GFR calc Af Amer 11/15/2014 105  >59 mL/min/1.73 Final  . BUN/Creatinine Ratio 11/15/2014 15  11 - 26 Final  . Sodium 11/15/2014 139  134 - 144 mmol/L Final  . Potassium 11/15/2014 4.5  3.5 - 5.2 mmol/L Final  . Chloride 11/15/2014 103  97 - 108 mmol/L Final  . CO2 11/15/2014 22  18 - 29 mmol/L Final  . Calcium 11/15/2014 8.7  8.7 - 10.3 mg/dL Final  . Total Protein 11/15/2014 6.5  6.0 - 8.5 g/dL Final  . Albumin 41/32/4401 4.0  3.5 - 4.8 g/dL Final  . Globulin, Total 11/15/2014 2.5  1.5 - 4.5 g/dL Final  . Albumin/Globulin Ratio 11/15/2014 1.6  1.1 - 2.5 Final  . Bilirubin Total 11/15/2014 0.4  0.0 - 1.2 mg/dL Final  . Alkaline Phosphatase 11/15/2014 65  39 - 117 IU/L Final  . AST 11/15/2014 23  0 - 40 IU/L Final  . ALT 11/15/2014 23  0 - 32 IU/L Final  . Hgb A1c MFr Bld 11/15/2014 6.2* 4.8 - 5.6 % Final   Comment:          Pre-diabetes: 5.7 - 6.4          Diabetes: >6.4          Glycemic control for adults with diabetes: <7.0   . Est. average glucose Bld gHb Est-m* 11/15/2014 131   Final    No results found.   Assessment/Plan   ICD-9-CM ICD-10-CM   1. Dizziness and giddiness prob due to #2 - improved 780.4 R42   2. Allergic rhinitis due to pollen 477.0 J30.1   3. Hip bursitis, left 726.5 M70.72   4. Need for prophylactic vaccination and inoculation against influenza V04.81 Z23 Flu Vaccine QUAD 36+ mos PF IM (Fluarix & Fluzone Quad PF)    Recommend OTC plain claritin, zyrtec or allegra daily for seasonal allergy. It will help dry up fullness in ears.  May need meclizine if no improvement  Apply warm compresses soaked in epsom  salt 3  times daily to left thigh to help relieve bursitis pain  Continue other medications as ordered  Follow up as scheduled  Flu shot given today  Sai Zinn S. Ancil Linsey  [facility] [address] [place], [place] [address] [phone] Cell (Monday-Friday 8 AM - 5 PM) [phone] After 5 PM and follow prompts

## 2015-01-19 NOTE — Patient Instructions (Signed)
Recommend OTC plain claritin, zyrtec or allegra daily for seasonal allergy. It will help dry up fullness in ears.  Apply warm compresses soaked in epsom salt 3 times daily to left thigh to help relieve bursitis pain  Continue other medications as ordered  Follow up as scheduled  Flu shot given today

## 2015-02-01 DIAGNOSIS — H25012 Cortical age-related cataract, left eye: Secondary | ICD-10-CM | POA: Diagnosis not present

## 2015-02-01 DIAGNOSIS — H2589 Other age-related cataract: Secondary | ICD-10-CM | POA: Diagnosis not present

## 2015-02-01 DIAGNOSIS — H2512 Age-related nuclear cataract, left eye: Secondary | ICD-10-CM | POA: Diagnosis not present

## 2015-02-01 DIAGNOSIS — H259 Unspecified age-related cataract: Secondary | ICD-10-CM | POA: Diagnosis not present

## 2015-02-14 DIAGNOSIS — H35342 Macular cyst, hole, or pseudohole, left eye: Secondary | ICD-10-CM | POA: Diagnosis not present

## 2015-02-14 DIAGNOSIS — H35373 Puckering of macula, bilateral: Secondary | ICD-10-CM | POA: Diagnosis not present

## 2015-02-14 DIAGNOSIS — H43813 Vitreous degeneration, bilateral: Secondary | ICD-10-CM | POA: Diagnosis not present

## 2015-02-22 DIAGNOSIS — H35342 Macular cyst, hole, or pseudohole, left eye: Secondary | ICD-10-CM | POA: Diagnosis not present

## 2015-03-24 DIAGNOSIS — H35373 Puckering of macula, bilateral: Secondary | ICD-10-CM | POA: Diagnosis not present

## 2015-04-27 DIAGNOSIS — H35342 Macular cyst, hole, or pseudohole, left eye: Secondary | ICD-10-CM | POA: Diagnosis not present

## 2015-08-02 ENCOUNTER — Other Ambulatory Visit: Payer: Self-pay | Admitting: Internal Medicine

## 2015-08-17 ENCOUNTER — Other Ambulatory Visit: Payer: Medicare Other

## 2015-08-19 ENCOUNTER — Ambulatory Visit: Payer: Medicare Other | Admitting: Internal Medicine

## 2015-08-24 ENCOUNTER — Ambulatory Visit: Payer: Medicare Other | Admitting: Internal Medicine

## 2015-11-02 ENCOUNTER — Ambulatory Visit (INDEPENDENT_AMBULATORY_CARE_PROVIDER_SITE_OTHER): Payer: Medicare Other | Admitting: Internal Medicine

## 2015-11-02 ENCOUNTER — Encounter: Payer: Self-pay | Admitting: Internal Medicine

## 2015-11-02 VITALS — BP 118/78 | HR 85 | Temp 98.1°F | Ht 59.0 in | Wt 126.0 lb

## 2015-11-02 DIAGNOSIS — R7303 Prediabetes: Secondary | ICD-10-CM | POA: Diagnosis not present

## 2015-11-02 DIAGNOSIS — R896 Abnormal cytological findings in specimens from other organs, systems and tissues: Secondary | ICD-10-CM | POA: Diagnosis not present

## 2015-11-02 DIAGNOSIS — R1900 Intra-abdominal and pelvic swelling, mass and lump, unspecified site: Secondary | ICD-10-CM | POA: Diagnosis not present

## 2015-11-02 DIAGNOSIS — IMO0002 Reserved for concepts with insufficient information to code with codable children: Secondary | ICD-10-CM

## 2015-11-02 DIAGNOSIS — R198 Other specified symptoms and signs involving the digestive system and abdomen: Secondary | ICD-10-CM

## 2015-11-02 DIAGNOSIS — K219 Gastro-esophageal reflux disease without esophagitis: Secondary | ICD-10-CM | POA: Diagnosis not present

## 2015-11-02 DIAGNOSIS — E039 Hypothyroidism, unspecified: Secondary | ICD-10-CM

## 2015-11-02 DIAGNOSIS — E785 Hyperlipidemia, unspecified: Secondary | ICD-10-CM

## 2015-11-02 MED ORDER — MELOXICAM 15 MG PO TABS: ORAL_TABLET | ORAL | Status: DC

## 2015-11-02 MED ORDER — LEVOTHYROXINE SODIUM 25 MCG PO TABS: 25.0000 ug | ORAL_TABLET | Freq: Every day | ORAL | Status: DC

## 2015-11-02 MED ORDER — OMEPRAZOLE 20 MG PO CPDR: DELAYED_RELEASE_CAPSULE | ORAL | Status: DC

## 2015-11-02 MED ORDER — SIMVASTATIN 20 MG PO TABS: ORAL_TABLET | ORAL | Status: DC

## 2015-11-02 NOTE — Progress Notes (Signed)
Patient ID: [name] [name], female   DOB: 12/08/1945, 70 y.o.   MRN: [id number]    Location:  [facility] Place of Service: OFFICE  Chief Complaint  Patient presents with  . Medical Management of Chronic Issues    6 month follow up and prolia  . OTHER    discuss pneum. vac    HPI:  70 yo female seen today for f/u. She c/o increased abdominal girth x 3 yrs. No pain. No N/V. She declines further w/u. Acid reflux controlled on omeprazole. She never went for GYN referral last year when pap returned abnormal. Path revealed LGSIL with dysplasia with cells s/o high grade. needs med RF Spouse and translator present today  thyroid stable on levothyroxine. No CP, palpitations, fatigue, SOB, change in bowel habits  She takes statin for cholesterol. No myalgias  Joint pain controlled on meloxicam  She has gotten prolia injection q446mos for osteoporosis. Insurance denied coverage recently     Past Medical History  Diagnosis Date  . Hypothyroidism   . Headache(784.0)     WHEN REFLUX SEVERE  . GERD (gastroesophageal reflux disease)     PT STATES SEVERE REFLUX IF SHE DOES NOT TAKE HER OMEPRAZOLE  . Arthritis     OA AND PAIN RIGHT KNEE  . Vegetarian diet   . Osteoarthrosis, unspecified whether generalized or localized, lower leg   . Degeneration of intervertebral disc, site unspecified   . Other specified pre-operative examination 11/07/2011  . Unspecified vitamin D deficiency   . Thoracic or lumbosacral neuritis or radiculitis, unspecified   . Encounter for long-term (current) use of other medications   . Long term (current) use of anticoagulants   . Osteoporosis, unspecified   . Disorder of bone and cartilage, unspecified   . Acute bronchiolitis due to respiratory syncytial virus (RSV)   . Acute bronchiolitis due to other infectious organisms   . Other atopic dermatitis and related conditions   . Other malaise and fatigue   . Disturbance of skin sensation   . Other and unspecified  hyperlipidemia     Past Surgical History  Procedure Laterality Date  . Teeth extractions--no other surgery    . Total knee arthroplasty  11/28/2011    Procedure: TOTAL KNEE ARTHROPLASTY;  Surgeon: Jacki Conesonald A Gioffre, MD;  Location: [facility];  Service: Orthopedics;  Laterality: Right;  . Tubal ligation      Patient Care Team: Kirt BoysMonica Oliver Neuwirth, DO as PCP - General (Internal Medicine)  Social History   Social History  . Marital Status: Married    Spouse Name: N/A  . Number of Children: N/A  . Years of Education: N/A   Occupational History  . Not on file.   Social History Main Topics  . Smoking status: Never Smoker   . Smokeless tobacco: Never Used  . Alcohol Use: No  . Drug Use: No  . Sexual Activity: Not on file   Other Topics Concern  . Not on file   Social History Narrative     reports that she has never smoked. She has never used smokeless tobacco. She reports that she does not drink alcohol or use illicit drugs.  Family History  Problem Relation Age of Onset  . Alport syndrome Brother    Family Status  Relation Status Death Age  . Mother Deceased   . Father Deceased   . Sister Alive   . Brother Alive   . Daughter Alive   . Son Alive   . Brother Alive   .  Sister Alive   . Brother Alive   . Daughter Alive      No Known Allergies  Medications: Patient's Medications  New Prescriptions   No medications on file  Previous Medications   ALBUTEROL (PROVENTIL HFA;VENTOLIN HFA) 108 (90 BASE) MCG/ACT INHALER    Inhale 2 puffs into the lungs every 6 (six) hours as needed for wheezing or shortness of breath.   ASPIRIN 81 MG TABLET    Take 81 mg by mouth daily.   BETAMETHASONE DIPROPIONATE (DIPROLENE) 0.05 % CREAM    Apply topically daily.   CALCIUM CARBONATE (OS-CAL) 600 MG TABS    Take 600 mg by mouth 2 (two) times daily with a meal. Take one tablet twice a day for calcium supplement   DENOSUMAB (PROLIA) 60 MG/ML SOLN INJECTION    Inject 60 mg into the skin once.  Administer in upper arm, thigh, or abdomen   LEVOTHYROXINE (SYNTHROID, LEVOTHROID) 25 MCG TABLET    Take 25 mcg by mouth daily before breakfast.   MELOXICAM (MOBIC) 15 MG TABLET    Take one tablet once daily for leg pain   OMEPRAZOLE (PRILOSEC) 20 MG CAPSULE    Take one capsule by mouth once daily to reduce stomach acid   SIMVASTATIN (ZOCOR) 20 MG TABLET    Take one tablet by mouth once daily for cholesterol  Modified Medications   No medications on file  Discontinued Medications   LEVOTHYROXINE (SYNTHROID, LEVOTHROID) 25 MCG TABLET    TAKE ONE TABLET BY MOUTH 30 MINUTES BEFORE BREAKFAST DAILY FOR THYROID   LEVOTHYROXINE (SYNTHROID, LEVOTHROID) 50 MCG TABLET    Take one tablet by mouth 30 minutes before breakfast once daily for thyroid    Review of Systems  Gastrointestinal:       Increased abdominal girth  Genitourinary: Negative for pelvic pain.  Musculoskeletal: Positive for arthralgias.  All other systems reviewed and are negative.   Filed Vitals:   11/02/15 1537  BP: 118/78  Pulse: 85  Temp: 98.1 F (36.7 C)  TempSrc: Oral  Height:  (1.499 m)  Weight: 126 lb (57.153 kg)  SpO2: 97%   Body mass index is 25.44 kg/(m^2).  Physical Exam  Constitutional: She is oriented to person, place, and time. She appears well-developed and well-nourished.  HENT:  Mouth/Throat: Oropharynx is clear and moist. No oropharyngeal exudate.  Eyes: Pupils are equal, round, and reactive to light.  Neck: Neck supple. No tracheal deviation present.  Cardiovascular: Normal rate, regular rhythm and intact distal pulses.  Exam reveals no gallop and no friction rub.   Murmur (1/6 SEM) heard. Pulmonary/Chest: Effort normal and breath sounds normal. No stridor. No respiratory distress. She has no wheezes. She has no rales. She exhibits no tenderness.  Abdominal: Soft. Bowel sounds are normal. She exhibits no distension and no mass. There is no hepatomegaly. There is no tenderness. There is no  rebound and no guarding.  Musculoskeletal: Normal range of motion.  Lymphadenopathy:    She has no cervical adenopathy.  Neurological: She is alert and oriented to person, place, and time.  Skin: Skin is warm and dry. No rash noted.  Psychiatric: She has a normal mood and affect. Her behavior is normal. Thought content normal.     Labs reviewed: No visits with results within 3 Month(s) from this visit. Latest known visit with results is:  Appointment on 01/17/2015  Component Date Value Ref Range Status  . TSH 01/17/2015 2.070  0.450 - 4.500 uIU/mL Final  No results found.   Assessment/Plan   ICD-9-CM ICD-10-CM   1. LGSIL (low grade squamous intraepithelial dysplasia) - on pap in 11/2014 796.9 R89.6 Ambulatory referral to Gynecology     CMP  2. Increased abdominal girth 789.30 R19.00 Ambulatory referral to Gynecology     CBC with Differential/Platelets     CMP     Urinalysis with Reflex Microscopic  3. Hypothyroidism, unspecified hypothyroidism type 244.9 E03.9 CBC with Differential/Platelets     TSH     T4, Free  4. Prediabetes 790.29 R73.03 Hemoglobin A1c     Urinalysis with Reflex Microscopic  5. Hyperlipidemia LDL goal <130 272.4 E78.5 CMP     Lipid Panel  6. Gastroesophageal reflux disease, esophagitis presence not specified 530.81 K21.9     Continue current medications as ordered  Will call with GYN referral  Will call with lab results  Follow up in 6 mos for routine visit. Fasting labs prior to appt (CMP and lipid panel)   Deshaun Weisinger S. Ancil Linseyarter, D. O., F. A. C. O. I.  [facility] [address] [place], [place] [address] [phone] Cell (Monday-Friday 8 AM - 5 PM) [phone] After 5 PM and follow prompts

## 2015-11-02 NOTE — Patient Instructions (Addendum)
Continue current medications as ordered  Will call with GYN referral  Will call with lab results  Follow up in 6 mos for routine visit. Fasting labs prior to appt

## 2015-11-03 LAB — CBC WITH DIFFERENTIAL/PLATELET
BASOS ABS: 0.1 10*3/uL (ref 0.0–0.2)
BASOS: 1 %
EOS (ABSOLUTE): 0.1 10*3/uL (ref 0.0–0.4)
Eos: 1 %
Hematocrit: 34.2 % (ref 34.0–46.6)
Hemoglobin: 10.6 g/dL — ABNORMAL LOW (ref 11.1–15.9)
Immature Grans (Abs): 0.1 10*3/uL (ref 0.0–0.1)
Immature Granulocytes: 1 %
LYMPHS ABS: 3.9 10*3/uL — AB (ref 0.7–3.1)
Lymphs: 41 %
MCH: 24.6 pg — ABNORMAL LOW (ref 26.6–33.0)
MCHC: 31 g/dL — ABNORMAL LOW (ref 31.5–35.7)
MCV: 79 fL (ref 79–97)
MONOS ABS: 1.2 10*3/uL — AB (ref 0.1–0.9)
Monocytes: 12 %
NEUTROS ABS: 4.3 10*3/uL (ref 1.4–7.0)
Neutrophils: 44 %
PLATELETS: 356 10*3/uL (ref 150–379)
RBC: 4.31 x10E6/uL (ref 3.77–5.28)
RDW: 16.2 % — AB (ref 12.3–15.4)
WBC: 9.6 10*3/uL (ref 3.4–10.8)

## 2015-11-03 LAB — URINALYSIS, ROUTINE W REFLEX MICROSCOPIC
Bilirubin, UA: NEGATIVE
Glucose, UA: NEGATIVE
Ketones, UA: NEGATIVE
Leukocytes, UA: NEGATIVE
NITRITE UA: NEGATIVE
Protein, UA: NEGATIVE
RBC, UA: NEGATIVE
Specific Gravity, UA: 1.018 (ref 1.005–1.030)
UUROB: 0.2 mg/dL (ref 0.2–1.0)
pH, UA: 6.5 (ref 5.0–7.5)

## 2015-11-03 LAB — COMPREHENSIVE METABOLIC PANEL
ALT: 25 IU/L (ref 0–32)
AST: 25 IU/L (ref 0–40)
Albumin/Globulin Ratio: 1.5 (ref 1.2–2.2)
Albumin: 4.2 g/dL (ref 3.5–4.8)
Alkaline Phosphatase: 85 IU/L (ref 39–117)
BUN/Creatinine Ratio: 18 (ref 12–28)
BUN: 9 mg/dL (ref 8–27)
Bilirubin Total: <0.2 mg/dL (ref 0.0–1.2)
CO2: 25 mmol/L (ref 18–29)
CREATININE: 0.51 mg/dL — AB (ref 0.57–1.00)
Calcium: 9.3 mg/dL (ref 8.7–10.3)
Chloride: 102 mmol/L (ref 96–106)
GFR calc Af Amer: 111 mL/min/{1.73_m2} (ref >59)
GFR calc non Af Amer: 96 mL/min/{1.73_m2} (ref >59)
GLUCOSE: 88 mg/dL (ref 65–99)
Globulin, Total: 2.8 g/dL (ref 1.5–4.5)
POTASSIUM: 4.6 mmol/L (ref 3.5–5.2)
Sodium: 140 mmol/L (ref 134–144)
Total Protein: 7 g/dL (ref 6.0–8.5)

## 2015-11-03 LAB — T4, FREE: Free T4: 1.36 ng/dL (ref 0.82–1.77)

## 2015-11-03 LAB — LIPID PANEL
CHOL/HDL RATIO: 3.5 ratio (ref 0.0–4.4)
Cholesterol, Total: 184 mg/dL (ref 100–199)
HDL: 53 mg/dL (ref >39)
LDL CALC: 95 mg/dL (ref 0–99)
TRIGLYCERIDES: 181 mg/dL — AB (ref 0–149)
VLDL CHOLESTEROL CAL: 36 mg/dL (ref 5–40)

## 2015-11-03 LAB — TSH: TSH: 1.6 u[IU]/mL (ref 0.450–4.500)

## 2015-11-03 LAB — HEMOGLOBIN A1C
ESTIMATED AVERAGE GLUCOSE: 120 mg/dL
Hgb A1c MFr Bld: 5.8 % — ABNORMAL HIGH (ref 4.8–5.6)

## 2015-11-03 MED ORDER — FERROUS SULFATE 325 (65 FE) MG PO TABS: 325.0000 mg | ORAL_TABLET | Freq: Every day | ORAL | Status: DC

## 2015-11-03 NOTE — Addendum Note (Signed)
Addended by: Chriss DriverLANE, Sarabella Caprio L on: 11/03/2015 04:58 PM   Modules accepted: Orders, Medications

## 2015-11-14 DIAGNOSIS — H43813 Vitreous degeneration, bilateral: Secondary | ICD-10-CM | POA: Diagnosis not present

## 2015-11-14 DIAGNOSIS — H31012 Macula scars of posterior pole (postinflammatory) (post-traumatic), left eye: Secondary | ICD-10-CM | POA: Diagnosis not present

## 2015-11-14 DIAGNOSIS — H35373 Puckering of macula, bilateral: Secondary | ICD-10-CM | POA: Diagnosis not present

## 2015-11-23 ENCOUNTER — Ambulatory Visit: Payer: Medicare Other | Admitting: Obstetrics and Gynecology

## 2015-11-28 ENCOUNTER — Ambulatory Visit (INDEPENDENT_AMBULATORY_CARE_PROVIDER_SITE_OTHER): Payer: Medicare Other | Admitting: Obstetrics and Gynecology

## 2015-11-28 ENCOUNTER — Encounter: Payer: Self-pay | Admitting: Obstetrics and Gynecology

## 2015-11-28 VITALS — BP 120/80 | HR 80 | Ht <= 58 in | Wt 126.4 lb

## 2015-11-28 DIAGNOSIS — N888 Other specified noninflammatory disorders of cervix uteri: Secondary | ICD-10-CM | POA: Diagnosis not present

## 2015-11-28 DIAGNOSIS — Z124 Encounter for screening for malignant neoplasm of cervix: Secondary | ICD-10-CM | POA: Diagnosis not present

## 2015-11-28 DIAGNOSIS — R87612 Low grade squamous intraepithelial lesion on cytologic smear of cervix (LGSIL): Secondary | ICD-10-CM | POA: Diagnosis not present

## 2015-11-28 DIAGNOSIS — R87611 Atypical squamous cells cannot exclude high grade squamous intraepithelial lesion on cytologic smear of cervix (ASC-H): Secondary | ICD-10-CM | POA: Diagnosis not present

## 2015-11-28 NOTE — Progress Notes (Signed)
70 y.o. G38P3003 Married Hindu F here for a consultation from Dr Kirt Boys for evaluation of an abnormal pap smear. Last year she had a LSIL, suspicious for HSIL pap smear and never came for the recommended evaluation. She is here today with her husband and an interpreter.     No LMP recorded (exact date). Patient is postmenopausal.          Sexually active: Yes.    The current method of family planning is post menopausal status.    Exercising: Yes.    Walking Smoker:  no  Health Maintenance: Pap:  11/19/14 LGSIL, Mild Dysplasia, suspicious for HGSIL History of abnormal Pap:  no MMG:  10/19/13 BIRADS1 Colonoscopy:  Never BMD: 04/03/13 Osteoporosis TDaP:  03/28/2011   reports that she has never smoked. She has never used smokeless tobacco. She reports that she does not drink alcohol or use drugs.  Past Medical History:  Diagnosis Date  . Acute bronchiolitis due to other infectious organisms   . Acute bronchiolitis due to respiratory syncytial virus (RSV)   . Arthritis    OA AND PAIN RIGHT KNEE  . Degeneration of intervertebral disc, site unspecified   . Disorder of bone and cartilage, unspecified   . Disturbance of skin sensation   . Encounter for long-term (current) use of other medications   . GERD (gastroesophageal reflux disease)    PT STATES SEVERE REFLUX IF SHE DOES NOT TAKE HER OMEPRAZOLE  . Headache(784.0)    WHEN REFLUX SEVERE  . Hypothyroidism   . Long term (current) use of anticoagulants   . Osteoarthrosis, unspecified whether generalized or localized, lower leg   . Osteoporosis, unspecified   . Other and unspecified hyperlipidemia   . Other atopic dermatitis and related conditions   . Other malaise and fatigue   . Other specified pre-operative examination 11/07/2011  . Thoracic or lumbosacral neuritis or radiculitis, unspecified   . Unspecified vitamin D deficiency   . Vegetarian diet     Past Surgical History:  Procedure Laterality Date  . TEETH  EXTRACTIONS--NO OTHER SURGERY    . TOTAL KNEE ARTHROPLASTY  11/28/2011   Procedure: TOTAL KNEE ARTHROPLASTY;  Surgeon: Jacki Cones, MD;  Location: [facility];  Service: Orthopedics;  Laterality: Right;  . TUBAL LIGATION      Current Outpatient Prescriptions  Medication Sig Dispense Refill  . albuterol (PROVENTIL HFA;VENTOLIN HFA) 108 (90 BASE) MCG/ACT inhaler Inhale 2 puffs into the lungs every 6 (six) hours as needed for wheezing or shortness of breath. 1 Inhaler 3  . aspirin 81 MG tablet Take 81 mg by mouth daily.    . betamethasone dipropionate (DIPROLENE) 0.05 % cream Apply topically daily. 45 g 5  . calcium carbonate (OS-CAL) 600 MG TABS Take 600 mg by mouth 2 (two) times daily with a meal. Take one tablet twice a day for calcium supplement    . denosumab (PROLIA) 60 MG/ML SOLN injection Inject 60 mg into the skin once. Administer in upper arm, thigh, or abdomen 180 mL 0  . ferrous sulfate 325 (65 FE) MG tablet Take 1 tablet (325 mg total) by mouth daily with breakfast. 30 tablet 3  . levothyroxine (SYNTHROID, LEVOTHROID) 25 MCG tablet Take 1 tablet (25 mcg total) by mouth daily before breakfast. 90 tablet 3  . meloxicam (MOBIC) 15 MG tablet Take one tablet once daily for leg pain 90 tablet 3  . omeprazole (PRILOSEC) 20 MG capsule Take one capsule by mouth once daily to reduce stomach  acid 90 capsule 3  . simvastatin (ZOCOR) 20 MG tablet Take one tablet by mouth once daily for cholesterol 90 tablet 3   No current facility-administered medications for this visit.     Family History  Problem Relation Age of Onset  . Alport syndrome Brother     Review of Systems  Constitutional: Negative.   HENT: Negative.   Eyes: Negative.   Respiratory: Negative.   Cardiovascular: Negative.   Gastrointestinal: Negative.   Endocrine: Negative.   Genitourinary: Negative.   Musculoskeletal: Negative.   Skin: Negative.   Allergic/Immunologic: Negative.   Neurological: Negative.    Hematological: Negative.   Psychiatric/Behavioral: Negative.     Exam:   BP 120/80 (BP Location: Right Arm, Patient Position: Sitting, Cuff Size: Normal)   Pulse 80   Ht 4' 9.25" (1.454 m)   Wt 126 lb 6.4 oz (57.3 kg)   LMP  (Exact Date)   BMI 27.11 kg/m   Weight change: @WEIGHTCHANGE @ Height:   Height: 4' 9.25" (145.4 cm)  Ht Readings from Last 3 Encounters:  11/28/15 4' 9.25" (1.454 m)  11/02/15 4\' 11"  (1.499 m)  01/19/15 4\' 11"  (1.499 m)    General appearance: alert, cooperative and appears stated age   Pelvic: External genitalia:  no lesions              Urethra:  normal appearing urethra with no masses, tenderness or lesions              Bartholins and Skenes: normal                 Vagina: normal appearing vagina with normal color and discharge, no lesions              Cervix: no lesions               Colposcopy: not satisfactory, mild aceto-white changes around the external os. Biopsies from 7 and 10 o'clock, ECC done. Decreased lugols uptake throughout the vagina, secondary to atrophy. No vaginal lesions seen.   Chaperone was present for exam.  A:  LSIL pap suspicious for HSIL from last year  Explained the abnormal pap and need for evaluation  P:   Pap, colpo with biopsies and ECC  Further plan depending on results. They may need to come in to discuss the results secondary to the language  barrier, they understand  CC: Dr Kirt Boys Letter sent

## 2015-11-28 NOTE — Patient Instructions (Signed)

## 2015-11-29 ENCOUNTER — Other Ambulatory Visit: Payer: Self-pay

## 2015-11-29 DIAGNOSIS — R7989 Other specified abnormal findings of blood chemistry: Secondary | ICD-10-CM

## 2015-11-30 ENCOUNTER — Encounter: Payer: Self-pay | Admitting: Internal Medicine

## 2015-11-30 ENCOUNTER — Other Ambulatory Visit: Payer: Medicare Other

## 2015-11-30 DIAGNOSIS — R7989 Other specified abnormal findings of blood chemistry: Secondary | ICD-10-CM | POA: Diagnosis not present

## 2015-11-30 LAB — CBC WITH DIFFERENTIAL/PLATELET
BASOS PCT: 1 %
Basophils Absolute: 85 cells/uL (ref 0–200)
EOS ABS: 85 {cells}/uL (ref 15–500)
Eosinophils Relative: 1 %
HCT: 35 % (ref 35.0–45.0)
Hemoglobin: 11.4 g/dL — ABNORMAL LOW (ref 11.7–15.5)
LYMPHS ABS: 2720 {cells}/uL (ref 850–3900)
Lymphocytes Relative: 32 %
MCH: 26.6 pg — ABNORMAL LOW (ref 27.0–33.0)
MCHC: 32.6 g/dL (ref 32.0–36.0)
MCV: 81.6 fL (ref 80.0–100.0)
MONOS PCT: 9 %
MPV: 9.5 fL (ref 7.5–12.5)
Monocytes Absolute: 765 cells/uL (ref 200–950)
NEUTROS ABS: 4845 {cells}/uL (ref 1500–7800)
Neutrophils Relative %: 57 %
PLATELETS: 311 10*3/uL (ref 140–400)
RBC: 4.29 MIL/uL (ref 3.80–5.10)
RDW: 18.3 % — ABNORMAL HIGH (ref 11.0–15.0)
WBC: 8.5 10*3/uL (ref 3.8–10.8)

## 2015-12-01 NOTE — Addendum Note (Signed)
Addended by: Tobi Bastos on: 12/01/2015 10:10 AM   Modules accepted: Orders

## 2015-12-07 LAB — IPS PAP TEST WITH HPV

## 2015-12-08 ENCOUNTER — Other Ambulatory Visit: Payer: Self-pay | Admitting: Internal Medicine

## 2015-12-08 DIAGNOSIS — Z1231 Encounter for screening mammogram for malignant neoplasm of breast: Secondary | ICD-10-CM

## 2015-12-12 ENCOUNTER — Ambulatory Visit
Admission: RE | Admit: 2015-12-12 | Discharge: 2015-12-12 | Disposition: A | Discharge status: Home / Self Care | Payer: Medicare Other | Source: Ambulatory Visit | Attending: Internal Medicine | Admitting: Internal Medicine

## 2015-12-12 DIAGNOSIS — Z1231 Encounter for screening mammogram for malignant neoplasm of breast: Secondary | ICD-10-CM

## 2015-12-13 ENCOUNTER — Telehealth: Payer: Self-pay | Admitting: Obstetrics and Gynecology

## 2015-12-13 NOTE — Telephone Encounter (Signed)
Spoke with patient's husband Manubhai. He requests that I speak with patient's son Elby ShowersRaj, okay per ROI, due to language barrier. Advised I will contact Elby ShowersRaj to discuss at this time.Spoke with Elby Showersaj. Elby ShowersRaj verbalizes understanding of results. Would like to set up an appointment for patient to have a consultation with Dr.Jertson before having a LEEP procedure. Appointment scheduled for 12/15/2015 at 9:15 am with Dr.Jertson. He is agreeable and will notify the patient. States interpreter will not be needed as he or his sister will come with the patient to the appointment to interpret for the patient.  Routing to provider for final review. Patient agreeable to disposition. Will close encounter.

## 2015-12-13 NOTE — Telephone Encounter (Signed)
Patient's husband called for recent results. DPR on file to share PHI.

## 2015-12-15 ENCOUNTER — Ambulatory Visit (INDEPENDENT_AMBULATORY_CARE_PROVIDER_SITE_OTHER): Payer: Medicare Other | Admitting: Obstetrics and Gynecology

## 2015-12-15 ENCOUNTER — Encounter: Payer: Self-pay | Admitting: Obstetrics and Gynecology

## 2015-12-15 VITALS — BP 140/90 | HR 88 | Resp 16 | Ht <= 58 in | Wt 126.0 lb

## 2015-12-15 DIAGNOSIS — B977 Papillomavirus as the cause of diseases classified elsewhere: Secondary | ICD-10-CM

## 2015-12-15 DIAGNOSIS — R87611 Atypical squamous cells cannot exclude high grade squamous intraepithelial lesion on cytologic smear of cervix (ASC-H): Secondary | ICD-10-CM

## 2015-12-15 NOTE — Patient Instructions (Signed)
Loop Electrosurgical Excision Procedure  Loop electrosurgical excision procedure (LEEP) is the removal of a portion of the lower part of the uterus (cervix). The procedure is done when there are significantly abnormal cervical cell changes. Abnormal cell changes of the cervix can lead to cancer if left in place and untreated.     The LEEP procedure itself typically only takes a few minutes. Often, it may be done in your caregiver's office. The procedure is considered safe for those who wish to get pregnant or are trying to get pregnant. Only under rare circumstances should this procedure be done if you are pregnant.  LET YOUR CAREGIVER KNOW ABOUT:  · Whether you are pregnant or late for your last menstrual period.  · Allergies to foods or medicines.  · All the medicines you are taking including herbs, eyedrops, and over-the-counter medicines, and creams.  · Use of steroids (by mouth or creams).  · Previous problems with anesthetics or numbing medicine.  · Previous gynecological surgery.  · History of blood clots or bleeding problems.  · Any recent or current vaginal infections (herpes, sexually transmitted infections).  · Other health problems.  RISKS AND COMPLICATIONS  · Bleeding.  · Infection.  · Injury to the vagina, bladder, or rectum.  · Very rare obstruction of the cervical opening that causes problems during menstruation (cervical stenosis).  BEFORE THE PROCEDURE  · Do not take aspirin or blood thinners (anticoagulants) for 1 week before the procedure, or as told by your caregiver.  · Eat a light meal before the procedure.  · Ask your caregiver about changing or stopping your regular medicines.  · You may be given a pain reliever 1 or 2 hours before the procedure.  PROCEDURE   · A tool (speculum) is placed in the vagina. This allows your caregiver to see the cervix.  · An iodine stain is applied to the cervix to find the area of abnormal cells to be removed.  · Medicine is injected to numb the cervix (local  anesthetic).    · Electricity is passed through a thin wire loop which is then used to remove (cauterize) a small segment of the affected cervix.  · Light electrocautery is used to seal any small blood vessels and prevent bleeding.  · A paste may be applied to the cauterized area of the cervix to help prevent bleeding.  · The tissue sample is sent to the lab. It is examined under the microscope.  AFTER THE PROCEDURE  · Have someone drive you home.  · You may have slight to moderate cramping.  · You may notice a black vaginal discharge from the paste used on the cervix to prevent bleeding. This is normal.  · Watch for excessive bleeding. This requires immediate medical care.  · Ask when your test results will be ready. Make sure you get your test results.     This information is not intended to replace advice given to you by your health care provider. Make sure you discuss any questions you have with your health care provider.     Document Released: 07/14/2002 Document Revised: 07/16/2011 Document Reviewed: 10/03/2010  Elsevier Interactive Patient Education ©2016 [employer] Inc[employer]

## 2015-12-15 NOTE — Progress Notes (Signed)
GYNECOLOGY  VISIT   HPI: 70 y.o.   Married  [place]  female   [phone] with No LMP recorded (exact date). Patient is postmenopausal.   here for discuss her abnormal pap results in combination with her biopsy results.    GYNECOLOGIC HISTORY: No LMP recorded (exact date). Patient is postmenopausal. Contraception: Post menopausal  Menopausal hormone therapy: None        OB History    Gravida Para Term Preterm AB Living   3 3 3     3    SAB TAB Ectopic Multiple Live Births           3         Patient Active Problem List   Diagnosis Date Noted  . Thyroid activity decreased 11/19/2014  . Pain, joint, multiple sites 11/19/2014  . Numbness and tingling of left leg 04/06/2014  . Routine general medical examination at a health care facility 09/29/2013  . Osteoporosis, post-menopausal 04/21/2013  . Cough variant asthma 03/17/2013  . Psoriasis 03/17/2013  . Persistent dry cough 03/17/2013  . Osteoarthritis of left knee 03/03/2013  . Psoriasis-like skin disease 03/03/2013  . Eczema 01/07/2013  . Need for prophylactic vaccination and inoculation against influenza 01/07/2013  . Prediabetes 12/17/2012  . Cough 12/17/2012  . Reactive airway disease with wheezing 12/03/2012  . URI, acute 12/03/2012  . Hypothyroidism 10/29/2012  . Unspecified vitamin D deficiency 10/29/2012  . Hyperlipidemia LDL goal <130 10/29/2012  . GERD (gastroesophageal reflux disease) 10/29/2012  . Osteoporosis 10/29/2012  . Myalgia and myositis 10/29/2012    Past Medical History:  Diagnosis Date  . Acute bronchiolitis due to other infectious organisms   . Acute bronchiolitis due to respiratory syncytial virus (RSV)   . Arthritis    OA AND PAIN RIGHT KNEE  . Degeneration of intervertebral disc, site unspecified   . Disorder of bone and cartilage, unspecified   . Disturbance of skin sensation   . Encounter for long-term (current) use of other medications   . GERD (gastroesophageal reflux disease)    PT STATES  SEVERE REFLUX IF SHE DOES NOT TAKE HER OMEPRAZOLE  . Headache(784.0)    WHEN REFLUX SEVERE  . Hypothyroidism   . Long term (current) use of anticoagulants   . Osteoarthrosis, unspecified whether generalized or localized, lower leg   . Osteoporosis, unspecified   . Other and unspecified hyperlipidemia   . Other atopic dermatitis and related conditions   . Other malaise and fatigue   . Other specified pre-operative examination 11/07/2011  . Thoracic or lumbosacral neuritis or radiculitis, unspecified   . Unspecified vitamin D deficiency   . Vegetarian diet     Past Surgical History:  Procedure Laterality Date  . TEETH EXTRACTIONS--NO OTHER SURGERY    . TOTAL KNEE ARTHROPLASTY  11/28/2011   Procedure: TOTAL KNEE ARTHROPLASTY;  Surgeon: Jacki Conesonald A Gioffre, MD;  Location: [facility];  Service: Orthopedics;  Laterality: Right;  . TUBAL LIGATION      Current Outpatient Prescriptions  Medication Sig Dispense Refill  . aspirin 81 MG tablet Take 81 mg by mouth daily.    . betamethasone dipropionate (DIPROLENE) 0.05 % cream Apply topically daily. 45 g 5  . calcium carbonate (OS-CAL) 600 MG TABS Take 600 mg by mouth 2 (two) times daily with a meal. Take one tablet twice a day for calcium supplement    . denosumab (PROLIA) 60 MG/ML SOLN injection Inject 60 mg into the skin once. Administer in upper arm, thigh, or abdomen 180  mL 0  . ferrous sulfate 325 (65 FE) MG tablet Take 1 tablet (325 mg total) by mouth daily with breakfast. 30 tablet 3  . levothyroxine (SYNTHROID, LEVOTHROID) 25 MCG tablet Take 1 tablet (25 mcg total) by mouth daily before breakfast. 90 tablet 3  . meloxicam (MOBIC) 15 MG tablet Take one tablet once daily for leg pain 90 tablet 3  . omeprazole (PRILOSEC) 20 MG capsule Take one capsule by mouth once daily to reduce stomach acid 90 capsule 3  . simvastatin (ZOCOR) 20 MG tablet Take one tablet by mouth once daily for cholesterol 90 tablet 3   No current facility-administered  medications for this visit.      ALLERGIES: Review of patient's allergies indicates no known allergies.  Family History  Problem Relation Age of Onset  . Alport syndrome Brother     Social History   Social History  . Marital status: Married    Spouse name: N/A  . Number of children: N/A  . Years of education: N/A   Occupational History  . Not on file.   Social History Main Topics  . Smoking status: Never Smoker  . Smokeless tobacco: Never Used  . Alcohol use No  . Drug use: No  . Sexual activity: Yes    Birth control/ protection: Post-menopausal   Other Topics Concern  . Not on file   Social History Narrative  . No narrative on file    Review of Systems  Constitutional: Negative.   HENT: Negative.   Eyes: Negative.   Respiratory: Negative.   Cardiovascular: Negative.   Gastrointestinal: Negative.   Genitourinary: Negative.   Musculoskeletal: Negative.   Skin: Negative.   Neurological: Negative.   Endo/Heme/Allergies: Negative.   Psychiatric/Behavioral: Negative.     PHYSICAL EXAMINATION:    BP 140/90 (BP Location: Right Arm, Patient Position: Sitting, Cuff Size: Normal)   Pulse 88   Resp 16   Ht 4' 9.25" (1.454 m)   Wt 126 lb (57.2 kg)   LMP  (Exact Date)   BMI 27.03 kg/m     General appearance: alert, cooperative and appears stated age   ASSESSMENT Patient with a persistent ASCUS-H pap with +HPV. Colposcopy biopsies and ECC were negative, but the colposcopy was unsatisfactory.    PLAN Discussed the need for a loop cone. Drew diagrams to assist in explaining the limitations of the colposcopy and what the loop cone entails. Her daughter acted as her interpreter today (declined other interpreter). Her husband was present as well She will return for a loop cone, she will need an interpreter at that visit All of the families questions were answered    An After Visit Summary was printed and given to the patient.  15 minutes face to face time of  which over 50% was spent in counseling.

## 2015-12-21 ENCOUNTER — Ambulatory Visit: Payer: Medicare Other | Admitting: Obstetrics and Gynecology

## 2016-01-16 ENCOUNTER — Ambulatory Visit (INDEPENDENT_AMBULATORY_CARE_PROVIDER_SITE_OTHER): Payer: Medicare Other | Admitting: Obstetrics and Gynecology

## 2016-01-16 ENCOUNTER — Encounter: Payer: Self-pay | Admitting: Obstetrics and Gynecology

## 2016-01-16 DIAGNOSIS — R87611 Atypical squamous cells cannot exclude high grade squamous intraepithelial lesion on cytologic smear of cervix (ASC-H): Secondary | ICD-10-CM | POA: Diagnosis not present

## 2016-01-16 DIAGNOSIS — N870 Mild cervical dysplasia: Secondary | ICD-10-CM | POA: Diagnosis not present

## 2016-01-16 DIAGNOSIS — B977 Papillomavirus as the cause of diseases classified elsewhere: Secondary | ICD-10-CM

## 2016-01-16 NOTE — Progress Notes (Signed)
GYNECOLOGY  VISIT   HPI: 70 y.o.   Married  [place]  female   [phone] with No LMP recorded (exact date). Patient is postmenopausal.   here for LEEP Procedure. Pap 11/28/15 showed ASCUS cannot rule out High-grade.  Unsatisfactory colposcopy, negative biopsies and ECC.  GYNECOLOGIC HISTORY: No LMP recorded (exact date). Patient is postmenopausal. Contraception:Post-menopause Menopausal hormone therapy: none        OB History    Gravida Para Term Preterm AB Living   3 3 3     3    SAB TAB Ectopic Multiple Live Births           3         Patient Active Problem List   Diagnosis Date Noted  . Thyroid activity decreased 11/19/2014  . Pain, joint, multiple sites 11/19/2014  . Numbness and tingling of left leg 04/06/2014  . Routine general medical examination at a health care facility 09/29/2013  . Osteoporosis, post-menopausal 04/21/2013  . Cough variant asthma 03/17/2013  . Psoriasis 03/17/2013  . Persistent dry cough 03/17/2013  . Osteoarthritis of left knee 03/03/2013  . Psoriasis-like skin disease 03/03/2013  . Eczema 01/07/2013  . Need for prophylactic vaccination and inoculation against influenza 01/07/2013  . Prediabetes 12/17/2012  . Cough 12/17/2012  . Reactive airway disease with wheezing 12/03/2012  . URI, acute 12/03/2012  . Hypothyroidism 10/29/2012  . Unspecified vitamin D deficiency 10/29/2012  . Hyperlipidemia LDL goal <130 10/29/2012  . GERD (gastroesophageal reflux disease) 10/29/2012  . Osteoporosis 10/29/2012  . Myalgia and myositis 10/29/2012    Past Medical History:  Diagnosis Date  . Acute bronchiolitis due to other infectious organisms   . Acute bronchiolitis due to respiratory syncytial virus (RSV)   . Arthritis    OA AND PAIN RIGHT KNEE  . Degeneration of intervertebral disc, site unspecified   . Disorder of bone and cartilage, unspecified   . Disturbance of skin sensation   . Encounter for long-term (current) use of other medications   . GERD  (gastroesophageal reflux disease)    PT STATES SEVERE REFLUX IF SHE DOES NOT TAKE HER OMEPRAZOLE  . Headache(784.0)    WHEN REFLUX SEVERE  . Hypothyroidism   . Long term (current) use of anticoagulants   . Osteoarthrosis, unspecified whether generalized or localized, lower leg   . Osteoporosis, unspecified   . Other and unspecified hyperlipidemia   . Other atopic dermatitis and related conditions   . Other malaise and fatigue   . Other specified pre-operative examination 11/07/2011  . Thoracic or lumbosacral neuritis or radiculitis, unspecified   . Unspecified vitamin D deficiency   . Vegetarian diet     Past Surgical History:  Procedure Laterality Date  . TEETH EXTRACTIONS--NO OTHER SURGERY    . TOTAL KNEE ARTHROPLASTY  11/28/2011   Procedure: TOTAL KNEE ARTHROPLASTY;  Surgeon: Jacki Cones, MD;  Location: [facility];  Service: Orthopedics;  Laterality: Right;  . TUBAL LIGATION      Current Outpatient Prescriptions  Medication Sig Dispense Refill  . aspirin 81 MG tablet Take 81 mg by mouth daily.    . betamethasone dipropionate (DIPROLENE) 0.05 % cream Apply topically daily. 45 g 5  . calcium carbonate (OS-CAL) 600 MG TABS Take 600 mg by mouth 2 (two) times daily with a meal. Take one tablet twice a day for calcium supplement    . denosumab (PROLIA) 60 MG/ML SOLN injection Inject 60 mg into the skin once. Administer in upper arm, thigh, or abdomen  180 mL 0  . ferrous sulfate 325 (65 FE) MG tablet Take 1 tablet (325 mg total) by mouth daily with breakfast. 30 tablet 3  . levothyroxine (SYNTHROID, LEVOTHROID) 25 MCG tablet Take 1 tablet (25 mcg total) by mouth daily before breakfast. 90 tablet 3  . meloxicam (MOBIC) 15 MG tablet Take one tablet once daily for leg pain 90 tablet 3  . omeprazole (PRILOSEC) 20 MG capsule Take one capsule by mouth once daily to reduce stomach acid 90 capsule 3  . simvastatin (ZOCOR) 20 MG tablet Take one tablet by mouth once daily for cholesterol 90  tablet 3   No current facility-administered medications for this visit.      ALLERGIES: Review of patient's allergies indicates no known allergies.  Family History  Problem Relation Age of Onset  . Alport syndrome Brother     Social History   Social History  . Marital status: Married    Spouse name: N/A  . Number of children: N/A  . Years of education: N/A   Occupational History  . Not on file.   Social History Main Topics  . Smoking status: Never Smoker  . Smokeless tobacco: Never Used  . Alcohol use No  . Drug use: No  . Sexual activity: Yes    Birth control/ protection: Post-menopausal   Other Topics Concern  . Not on file   Social History Narrative  . No narrative on file    Review of Systems  Constitutional: Negative.   HENT: Negative.   Eyes: Negative.   Respiratory: Negative.   Cardiovascular: Negative.   Gastrointestinal: Negative.   Genitourinary: Negative.   Musculoskeletal: Negative.   Skin: Negative.   Neurological: Negative.   Endo/Heme/Allergies: Negative.   Psychiatric/Behavioral: Negative.     PHYSICAL EXAMINATION:    BP 112/70 (BP Location: Right Arm, Patient Position: Sitting, Cuff Size: Normal)   Pulse 78   Resp 16   Ht 4' 9.25" (1.454 m)   Wt 128 lb 6.4 oz (58.2 kg)   LMP  (Exact Date)   BMI 27.54 kg/m     General appearance: alert, cooperative and appears stated age  Pelvic: External genitalia:  no lesions              Urethra:  normal appearing urethra with no masses, tenderness or lesions              Bartholins and Skenes: normal                 Vagina: atrophic appearing vagina with normal color and discharge, no lesions              Cervix: no gross lesions. The cervix is very short  Procedure: The patient was counseled as to the risks of the procedure, including: infection, bleeding and cervical stenosis. A consent form was signed.  Lugols solution was placed on the cervix and a paracervical block was injected using  1% lidocaine with epinephrine. 8 cc was used. The 1 x 1 cm loop was used to remove a portion of the ectocervix taking care to get the entire transformation zone.  A second specimen was not obtained because the cervix was too short.  The settings were 55 cut, 50 coag with a blend of 1.  An ECC was performed. The cautery ball was then used to cauterize the base of the biopsy site and monsels were placed. The patient tolerated the procedure well.   Chaperone was present for exam.  ASSESSMENT  ASCUS can't r/o HSIL pap, unsatisfactory colposcopy Vaginal atrophy, short cervix    PLAN LEEP/ECC done F/U in 1 month Further plan based on results.    An After Visit Summary was printed and given to the patient.  The patient was present with her husband and an interpreter.  She would like the results called to her son and mailed to her house (husband)  CC: Dr Kirt Boys

## 2016-01-16 NOTE — Patient Instructions (Signed)

## 2016-01-19 ENCOUNTER — Ambulatory Visit (INDEPENDENT_AMBULATORY_CARE_PROVIDER_SITE_OTHER): Payer: Medicare Other | Admitting: *Deleted

## 2016-01-19 DIAGNOSIS — M810 Age-related osteoporosis without current pathological fracture: Secondary | ICD-10-CM

## 2016-01-19 MED ORDER — DENOSUMAB 60 MG/ML ~~LOC~~ SOLN
60.0000 mg | Freq: Once | SUBCUTANEOUS | Status: AC
  Administered 2016-01-19: 60 mg via SUBCUTANEOUS

## 2016-01-23 DIAGNOSIS — M25562 Pain in left knee: Secondary | ICD-10-CM | POA: Diagnosis not present

## 2016-01-23 DIAGNOSIS — Z96651 Presence of right artificial knee joint: Secondary | ICD-10-CM | POA: Diagnosis not present

## 2016-01-27 ENCOUNTER — Telehealth: Payer: Self-pay

## 2016-01-27 NOTE — Telephone Encounter (Signed)
Spoke with patient's son Elby ShowersRaj, okay per ROI. Advised of results as seen below from Dr.Jertson. Elby ShowersRaj is agreeable and verbalizes understanding. 6 month repeat pap and ECC scheduled for 07/17/2015 at 1 pm with Dr.Jertson. Elby ShowersRaj is agreeable to date and time.  Instructions given. Motrin 800 mg po x , one hour before appointment with food. Make sure to eat a meal before appointment and drink plenty of fluids. Elby ShowersRaj will advise patient of results, appointment date and time and will return call if patient has any further questions.  Routing to provider for final review. Patient agreeable to disposition. Will close encounter.

## 2016-01-27 NOTE — Telephone Encounter (Signed)
-----   Message from Romualdo BolkJill Evelyn Jertson, MD sent at 01/23/2016 10:31 AM EDT ----- Leep with CIN I, possible positive margin, negative ECC. The patient is PMP and has a small cervix. I was unable to do a second pass or get a bigger specimen. The ECC being negative is very reassuring. I would recommend a f/u pap with hpv and ECC in 6 months. You are supposed to call her son with results (language barrier with the patient)

## 2016-02-08 ENCOUNTER — Ambulatory Visit: Payer: Medicare Other | Attending: Orthopedic Surgery | Admitting: Physical Therapy

## 2016-02-08 ENCOUNTER — Encounter: Payer: Self-pay | Admitting: Physical Therapy

## 2016-02-08 DIAGNOSIS — G8929 Other chronic pain: Secondary | ICD-10-CM | POA: Insufficient documentation

## 2016-02-08 DIAGNOSIS — M25661 Stiffness of right knee, not elsewhere classified: Secondary | ICD-10-CM | POA: Diagnosis not present

## 2016-02-08 DIAGNOSIS — R262 Difficulty in walking, not elsewhere classified: Secondary | ICD-10-CM | POA: Insufficient documentation

## 2016-02-08 DIAGNOSIS — M25562 Pain in left knee: Secondary | ICD-10-CM | POA: Insufficient documentation

## 2016-02-08 DIAGNOSIS — M25561 Pain in right knee: Secondary | ICD-10-CM | POA: Diagnosis not present

## 2016-02-08 NOTE — Therapy (Signed)
[facility] [place] [address] [place], [place], [address] Phone: [phone]   Fax:  [phone]  Physical Therapy Evaluation  Patient Details  Name: [name] [name] MRN: [id number] Date of Birth: [birth year] Referring Provider: Darrelyn Hillock  Encounter Date: 02/08/2016      PT End of Session - 02/08/16 1051    Visit Number 1   Date for PT Re-Evaluation 04/09/16   PT Start Time 0958   PT Stop Time 1052   PT Time Calculation (min) 54 min   Activity Tolerance Patient tolerated treatment well   Behavior During Therapy [facility] for tasks assessed/performed      Past Medical History:  Diagnosis Date  . Acute bronchiolitis due to other infectious organisms   . Acute bronchiolitis due to respiratory syncytial virus (RSV)   . Arthritis    OA AND PAIN RIGHT KNEE  . Degeneration of intervertebral disc, site unspecified   . Disorder of bone and cartilage, unspecified   . Disturbance of skin sensation   . Encounter for long-term (current) use of other medications   . GERD (gastroesophageal reflux disease)    PT STATES SEVERE REFLUX IF SHE DOES NOT TAKE HER OMEPRAZOLE  . Headache(784.0)    WHEN REFLUX SEVERE  . Hypothyroidism   . Long term (current) use of anticoagulants   . Osteoarthrosis, unspecified whether generalized or localized, lower leg   . Osteoporosis, unspecified   . Other and unspecified hyperlipidemia   . Other atopic dermatitis and related conditions   . Other malaise and fatigue   . Other specified pre-operative examination 11/07/2011  . Thoracic or lumbosacral neuritis or radiculitis, unspecified   . Unspecified vitamin D deficiency   . Vegetarian diet     Past Surgical History:  Procedure Laterality Date  . TEETH EXTRACTIONS--NO OTHER SURGERY    . TOTAL KNEE ARTHROPLASTY  11/28/2011   Procedure: TOTAL KNEE ARTHROPLASTY;  Surgeon: Jacki Cones, MD;  Location: [facility];  Service: Orthopedics;  Laterality: Right;   . TUBAL LIGATION      There were no vitals filed for this visit.       Subjective Assessment - 02/08/16 1010    Subjective Patient reports that she has bilateral knee pain, she had the right knee replaced in 2014.  She reports that she has continued to have pain in the right knee, the left knee pain is new.  She does report stairs are one at a time   Limitations Standing;Walking;House hold activities   Patient Stated Goals less pain, better motions   Currently in Pain? Yes   Pain Score 4    Pain Location Knee   Pain Orientation Right;Left;Anterior   Pain Descriptors / Indicators Aching   Pain Type Chronic pain   Pain Onset More than a month ago   Pain Frequency Constant   Aggravating Factors  bending pain up to 6/10, after cooking and cleaning pain will increase   Pain Relieving Factors rest pain 1-2/10   Effect of Pain on Daily Activities limits bending, limits standing to cook            [facility] PT Assessment - 02/08/16 0001      Assessment   Medical Diagnosis knee pain   Referring Provider Gioffre   Onset Date/Surgical Date 01/09/16   Prior Therapy no     Precautions   Precautions None     Balance Screen   Has the patient fallen in the past 6 months  No   Has the patient had a decrease in activity level because of a fear of falling?  No   Is the patient reluctant to leave their home because of a fear of falling?  No     Home Environment   Additional Comments has stairs, does housework     Prior Function   Level of Independence Independent   Vocation Retired   Leisure some walking in the past     ROM / Strength   AROM / PROM / Strength AROM;PROM;Strength     AROM   Overall AROM Comments AROM of the right knee 0-100 degrees flexion with pain     PROM   Overall PROM Comments PROM 0-110 degrees flexion with pain     Strength   Overall Strength Comments 4-/5 with pain for the right knee     Flexibility   Soft Tissue Assessment /Muscle Length --  tight  HS and ITB     Palpation   Palpation comment tener over the patellar tendon, medial right knee, she is also tender in the left quad, hip flexor area     Ambulation/Gait   Gait Comments no device, antalgic on the right is mild                   [facility] Adult PT Treatment/Exercise - 02/08/16 0001      Modalities   Modalities Iontophoresis;Ultrasound     Ultrasound   Ultrasound Location to the right patellar tendon area and medially   Ultrasound Parameters 3.3MHz 100% 1.2 w/cm2   Ultrasound Goals Pain     Iontophoresis   Type of Iontophoresis Dexamethasone   Location right patellar tendon area   Dose 80mA dose   Time 4 hour patch #1                  PT Short Term Goals - 02/08/16 1054      PT SHORT TERM GOAL #1   Title independent with initial HEP   Time 2   Period Weeks   Status New           PT Long Term Goals - 02/08/16 1055      PT LONG TERM GOAL #1   Title decrease pain 50%   Time 8   Period Weeks   Status New     PT LONG TERM GOAL #2   Title increase ROM of the right knee to 0-116 degrees flexion   Time 8   Period Weeks   Status New     PT LONG TERM GOAL #3   Title go up and down stairs step over step   Time 8   Period Weeks   Status New     PT LONG TERM GOAL #4   Title decrease pain 50%   Time 8   Period Weeks   Status New               Plan - 02/08/16 1052    Clinical Impression Statement Patient with a right TKR about 4 years ago, she reports that she has continued to have some pain in the anterior and medial knee area just past the joint line, she is tender here and has pain with flexion, she also c/o left thigh pain and knee pain, seems to have some tightness here.   Rehab Potential Good   PT Frequency 2x / week   PT Duration 8 weeks   PT Treatment/Interventions Cryotherapy;Electrical Stimulation;Iontophoresis 4mg /ml Dexamethasone;Moist Heat;Ultrasound;Gait  training;Functional mobility training;Patient/family  education;Balance training;Therapeutic exercise;Therapeutic activities;Manual techniques;Taping;Vasopneumatic Device   PT Next Visit Plan see if any changes, continue to look at the left thigh, start exercises   Consulted and Agree with Plan of Care Patient      Patient will benefit from skilled therapeutic intervention in order to improve the following deficits and impairments:  Abnormal gait, Decreased mobility, Decreased range of motion, Decreased strength, Difficulty walking, Impaired flexibility, Pain  Visit Diagnosis: Chronic pain of right knee - Plan: PT plan of care cert/re-cert  Chronic pain of left knee - Plan: PT plan of care cert/re-cert  Difficulty in walking, not elsewhere classified - Plan: PT plan of care cert/re-cert  Stiffness of right knee, not elsewhere classified - Plan: PT plan of care cert/re-cert      G-Codes - 02/24/2016 1056    Functional Assessment Tool Used Foto 66% limitation   Functional Limitation Mobility: Walking and moving around   Mobility: Walking and Moving Around Current Status [phone]) At least 60 percent but less than 80 percent impaired, limited or restricted   Mobility: Walking and Moving Around Goal Status [phone]) At least 40 percent but less than 60 percent impaired, limited or restricted       Problem List Patient Active Problem List   Diagnosis Date Noted  . Thyroid activity decreased 11/19/2014  . Pain, joint, multiple sites 11/19/2014  . Numbness and tingling of left leg 04/06/2014  . Routine general medical examination at a health care facility 09/29/2013  . Osteoporosis, post-menopausal 04/21/2013  . Cough variant asthma 03/17/2013  . Psoriasis 03/17/2013  . Persistent dry cough 03/17/2013  . Osteoarthritis of left knee 03/03/2013  . Psoriasis-like skin disease 03/03/2013  . Eczema 01/07/2013  . Need for prophylactic vaccination and inoculation against influenza 01/07/2013  . Prediabetes 12/17/2012  . Cough 12/17/2012  .  Reactive airway disease with wheezing 12/03/2012  . URI, acute 12/03/2012  . Hypothyroidism 10/29/2012  . Unspecified vitamin D deficiency 10/29/2012  . Hyperlipidemia LDL goal <130 10/29/2012  . GERD (gastroesophageal reflux disease) 10/29/2012  . Osteoporosis 10/29/2012  . Myalgia and myositis 10/29/2012    Jearld Lesch., PT 02-24-16, 11:00 AM  [facility] [place] [address] [place], [place], [address] Phone: [phone]   Fax:  [phone]  Name: [name] [name] MRN: [id number] Date of Birth: [birth year]

## 2016-02-14 ENCOUNTER — Ambulatory Visit: Payer: Medicare Other | Admitting: Physical Therapy

## 2016-02-14 ENCOUNTER — Encounter: Payer: Self-pay | Admitting: Physical Therapy

## 2016-02-14 DIAGNOSIS — G8929 Other chronic pain: Secondary | ICD-10-CM

## 2016-02-14 DIAGNOSIS — M25562 Pain in left knee: Secondary | ICD-10-CM | POA: Diagnosis not present

## 2016-02-14 DIAGNOSIS — M25561 Pain in right knee: Principal | ICD-10-CM

## 2016-02-14 DIAGNOSIS — R262 Difficulty in walking, not elsewhere classified: Secondary | ICD-10-CM | POA: Diagnosis not present

## 2016-02-14 DIAGNOSIS — M25661 Stiffness of right knee, not elsewhere classified: Secondary | ICD-10-CM

## 2016-02-14 NOTE — Therapy (Signed)
[facility] [place] [facility] [address] [place], [place], [address] Phone: [phone]   Fax:  [phone]  Physical Therapy Treatment  Patient Details  Name: [name] [name] MRN: [id number] Date of Birth: Sep 11, 1945 Referring Provider: Darrelyn Hillock  Encounter Date: 02/14/2016      PT End of Session - 02/14/16 1055    Visit Number 2   Date for PT Re-Evaluation 04/09/16   PT Start Time 1015   PT Stop Time 1056   PT Time Calculation (min) 41 min   Activity Tolerance Patient tolerated treatment well   Behavior During Therapy [facility] for tasks assessed/performed      Past Medical History:  Diagnosis Date  . Acute bronchiolitis due to other infectious organisms   . Acute bronchiolitis due to respiratory syncytial virus (RSV)   . Arthritis    OA AND PAIN RIGHT KNEE  . Degeneration of intervertebral disc, site unspecified   . Disorder of bone and cartilage, unspecified   . Disturbance of skin sensation   . Encounter for long-term (current) use of other medications   . GERD (gastroesophageal reflux disease)    PT STATES SEVERE REFLUX IF SHE DOES NOT TAKE HER OMEPRAZOLE  . Headache(784.0)    WHEN REFLUX SEVERE  . Hypothyroidism   . Long term (current) use of anticoagulants   . Osteoarthrosis, unspecified whether generalized or localized, lower leg   . Osteoporosis, unspecified   . Other and unspecified hyperlipidemia   . Other atopic dermatitis and related conditions   . Other malaise and fatigue   . Other specified pre-operative examination 11/07/2011  . Thoracic or lumbosacral neuritis or radiculitis, unspecified   . Unspecified vitamin D deficiency   . Vegetarian diet     Past Surgical History:  Procedure Laterality Date  . TEETH EXTRACTIONS--NO OTHER SURGERY    . TOTAL KNEE ARTHROPLASTY  11/28/2011   Procedure: TOTAL KNEE ARTHROPLASTY;  Surgeon: Jacki Cones, MD;  Location: [facility];  Service: Orthopedics;  Laterality: Right;   . TUBAL LIGATION      There were no vitals filed for this visit.      Subjective Assessment - 02/14/16 1017    Subjective "A little bit better"   Patient is accompained by: Interpreter   Currently in Pain? Yes   Pain Score 4    Pain Location Knee   Pain Orientation Right;Left                         [facility] Adult PT Treatment/Exercise - 02/14/16 0001      Exercises   Exercises Knee/Hip     Knee/Hip Exercises: Aerobic   Stationary Bike L0 x4 min     Knee/Hip Exercises: Seated   Long Arc Quad 2 sets;10 reps;Both   Marching Both;2 sets;10 reps   Hamstring Curl Both;1 set;5 reps     Modalities   Modalities Iontophoresis;Ultrasound     Ultrasound   Ultrasound Location to the R patella tendion and medially   Ultrasound Parameters 3. 100% 1.0w/cm2   Ultrasound Goals Pain     Iontophoresis   Type of Iontophoresis Dexamethasone   Location right patellar tendon area   Dose 80mA dose   Time 4 hour patch #1                  PT Short Term Goals - 02/08/16 1054      PT SHORT TERM GOAL #1   Title independent  with initial HEP   Time 2   Period Weeks   Status New           PT Long Term Goals - 02/08/16 1055      PT LONG TERM GOAL #1   Title decrease pain 50%   Time 8   Period Weeks   Status New     PT LONG TERM GOAL #2   Title increase ROM of the right knee to 0-116 degrees flexion   Time 8   Period Weeks   Status New     PT LONG TERM GOAL #3   Title go up and down stairs step over step   Time 8   Period Weeks   Status New     PT LONG TERM GOAL #4   Title decrease pain 50%   Time 8   Period Weeks   Status New               Plan - 02/14/16 1056    Clinical Impression Statement Pt with a mild progression to seated exercise interventions. Pt does report pain in R patella tendon area with LAQ. Pt demos weakness in both HS with SS curls. Some difficulty gathering information from pt, Pt husbands often talks over pt  and interpreter.   Rehab Potential Good   PT Frequency 2x / week   PT Duration 8 weeks   PT Treatment/Interventions Cryotherapy;Electrical Stimulation;Iontophoresis 4mg /ml Dexamethasone;Moist Heat;Ultrasound;Gait training;Functional mobility training;Patient/family education;Balance training;Therapeutic exercise;Therapeutic activities;Manual techniques;Taping;Vasopneumatic Device   PT Next Visit Plan see if any changes, continue to look at the left thigh, start exercises      Patient will benefit from skilled therapeutic intervention in order to improve the following deficits and impairments:  Abnormal gait, Decreased mobility, Decreased range of motion, Decreased strength, Difficulty walking, Impaired flexibility, Pain  Visit Diagnosis: Chronic pain of right knee  Chronic pain of left knee  Difficulty in walking, not elsewhere classified  Stiffness of right knee, not elsewhere classified     Problem List Patient Active Problem List   Diagnosis Date Noted  . Thyroid activity decreased 11/19/2014  . Pain, joint, multiple sites 11/19/2014  . Numbness and tingling of left leg 04/06/2014  . Routine general medical examination at a health care facility 09/29/2013  . Osteoporosis, post-menopausal 04/21/2013  . Cough variant asthma 03/17/2013  . Psoriasis 03/17/2013  . Persistent dry cough 03/17/2013  . Osteoarthritis of left knee 03/03/2013  . Psoriasis-like skin disease 03/03/2013  . Eczema 01/07/2013  . Need for prophylactic vaccination and inoculation against influenza 01/07/2013  . Prediabetes 12/17/2012  . Cough 12/17/2012  . Reactive airway disease with wheezing 12/03/2012  . URI, acute 12/03/2012  . Hypothyroidism 10/29/2012  . Unspecified vitamin D deficiency 10/29/2012  . Hyperlipidemia LDL goal <130 10/29/2012  . GERD (gastroesophageal reflux disease) 10/29/2012  . Osteoporosis 10/29/2012  . Myalgia and myositis 10/29/2012    Grayce Sessionsonald G Kahle Mcqueen, PTA  02/14/2016,  10:59 AM  [facility] [place] [address] [place], [place], [address] Phone: [phone]   Fax:  [phone]  Name: Eston MouldSudhaben M Zawadzki MRN: [id number] Date of Birth: 08/21/1945

## 2016-02-15 ENCOUNTER — Ambulatory Visit (INDEPENDENT_AMBULATORY_CARE_PROVIDER_SITE_OTHER): Payer: Medicare Other | Admitting: Obstetrics and Gynecology

## 2016-02-15 ENCOUNTER — Encounter: Payer: Self-pay | Admitting: Obstetrics and Gynecology

## 2016-02-15 VITALS — BP 130/78 | HR 80 | Resp 16 | Wt 130.0 lb

## 2016-02-15 DIAGNOSIS — Z9889 Other specified postprocedural states: Secondary | ICD-10-CM | POA: Diagnosis not present

## 2016-02-15 DIAGNOSIS — N870 Mild cervical dysplasia: Secondary | ICD-10-CM | POA: Diagnosis not present

## 2016-02-15 MED ORDER — ESTROGENS, CONJUGATED 0.625 MG/GM VA CREA: TOPICAL_CREAM | VAGINAL | 0 refills | Status: DC

## 2016-02-15 MED ORDER — MISOPROSTOL 200 MCG PO TABS: ORAL_TABLET | ORAL | 0 refills | Status: DC

## 2016-02-15 NOTE — Progress Notes (Signed)
GYNECOLOGY  VISIT   HPI: 70 y.o.   Married  [place]  female   [phone] with No LMP recorded (exact date). Patient is postmenopausal.   here for follow up LEEP.   The patient had a very short cervix, only one pass was made with the leep, pathology with CIN I, possible positive margin, negative ECC.   GYNECOLOGIC HISTORY: No LMP recorded (exact date). Patient is postmenopausal. Contraception:postmenopause  Menopausal hormone therapy: none         OB History    Gravida Para Term Preterm AB Living   3 3 3     3    SAB TAB Ectopic Multiple Live Births           3         Patient Active Problem List   Diagnosis Date Noted  . Thyroid activity decreased 11/19/2014  . Pain, joint, multiple sites 11/19/2014  . Numbness and tingling of left leg 04/06/2014  . Routine general medical examination at a health care facility 09/29/2013  . Osteoporosis, post-menopausal 04/21/2013  . Cough variant asthma 03/17/2013  . Psoriasis 03/17/2013  . Persistent dry cough 03/17/2013  . Osteoarthritis of left knee 03/03/2013  . Psoriasis-like skin disease 03/03/2013  . Eczema 01/07/2013  . Need for prophylactic vaccination and inoculation against influenza 01/07/2013  . Prediabetes 12/17/2012  . Cough 12/17/2012  . Reactive airway disease with wheezing 12/03/2012  . URI, acute 12/03/2012  . Hypothyroidism 10/29/2012  . Unspecified vitamin D deficiency 10/29/2012  . Hyperlipidemia LDL goal <130 10/29/2012  . GERD (gastroesophageal reflux disease) 10/29/2012  . Osteoporosis 10/29/2012  . Myalgia and myositis 10/29/2012    Past Medical History:  Diagnosis Date  . Acute bronchiolitis due to other infectious organisms   . Acute bronchiolitis due to respiratory syncytial virus (RSV)   . Arthritis    OA AND PAIN RIGHT KNEE  . Degeneration of intervertebral disc, site unspecified   . Disorder of bone and cartilage, unspecified   . Disturbance of skin sensation   . Encounter for long-term (current) use  of other medications   . GERD (gastroesophageal reflux disease)    PT STATES SEVERE REFLUX IF SHE DOES NOT TAKE HER OMEPRAZOLE  . Headache(784.0)    WHEN REFLUX SEVERE  . Hypothyroidism   . Long term (current) use of anticoagulants   . Osteoarthrosis, unspecified whether generalized or localized, lower leg   . Osteoporosis, unspecified   . Other and unspecified hyperlipidemia   . Other atopic dermatitis and related conditions   . Other malaise and fatigue   . Other specified pre-operative examination 11/07/2011  . Thoracic or lumbosacral neuritis or radiculitis, unspecified   . Unspecified vitamin D deficiency   . Vegetarian diet     Past Surgical History:  Procedure Laterality Date  . TEETH EXTRACTIONS--NO OTHER SURGERY    . TOTAL KNEE ARTHROPLASTY  11/28/2011   Procedure: TOTAL KNEE ARTHROPLASTY;  Surgeon: Jacki Conesonald A Gioffre, MD;  Location: [facility];  Service: Orthopedics;  Laterality: Right;  . TUBAL LIGATION      Current Outpatient Prescriptions  Medication Sig Dispense Refill  . aspirin 81 MG tablet Take 81 mg by mouth daily.    . betamethasone dipropionate (DIPROLENE) 0.05 % cream Apply topically daily. 45 g 5  . calcium carbonate (OS-CAL) 600 MG TABS Take 600 mg by mouth 2 (two) times daily with a meal. Take one tablet twice a day for calcium supplement    . denosumab (PROLIA) 60 MG/ML SOLN  injection Inject 60 mg into the skin once. Administer in upper arm, thigh, or abdomen 180 mL 0  . ferrous sulfate 325 (65 FE) MG tablet Take 1 tablet (325 mg total) by mouth daily with breakfast. 30 tablet 3  . levothyroxine (SYNTHROID, LEVOTHROID) 25 MCG tablet Take 1 tablet (25 mcg total) by mouth daily before breakfast. 90 tablet 3  . meloxicam (MOBIC) 15 MG tablet Take one tablet once daily for leg pain 90 tablet 3  . omeprazole (PRILOSEC) 20 MG capsule Take one capsule by mouth once daily to reduce stomach acid 90 capsule 3  . simvastatin (ZOCOR) 20 MG tablet Take one tablet by mouth  once daily for cholesterol 90 tablet 3   No current facility-administered medications for this visit.      ALLERGIES: Review of patient's allergies indicates no known allergies.  Family History  Problem Relation Age of Onset  . Alport syndrome Brother     Social History   Social History  . Marital status: Married    Spouse name: N/A  . Number of children: N/A  . Years of education: N/A   Occupational History  . Not on file.   Social History Main Topics  . Smoking status: Never Smoker  . Smokeless tobacco: Never Used  . Alcohol use No  . Drug use: No  . Sexual activity: Yes    Birth control/ protection: Post-menopausal   Other Topics Concern  . Not on file   Social History Narrative  . No narrative on file    Review of Systems  Constitutional: Negative.   HENT: Negative.   Eyes: Negative.   Respiratory: Negative.   Cardiovascular: Negative.   Gastrointestinal: Negative.   Genitourinary: Negative.   Musculoskeletal: Negative.   Skin: Negative.   Neurological: Negative.   Endo/Heme/Allergies: Negative.   Psychiatric/Behavioral: Negative.     PHYSICAL EXAMINATION:    BP 130/78 (BP Location: Right Arm, Patient Position: Sitting, Cuff Size: Normal)   Pulse 80   Resp 16   Wt 130 lb (59 kg)   LMP  (Exact Date)   BMI 27.89 kg/m     General appearance: alert, cooperative and appears stated age  Pelvic: External genitalia:  no lesions              Urethra:  normal appearing urethra with no masses, tenderness or lesions              Bartholins and Skenes: normal                 Vagina: normal appearing vagina with normal color and discharge, no lesions              Cervix:   Flush with her vagina, stenotic, friable appearing            Chaperone was present for exam.  ASSESSMENT CIN I, unclear margins, negative ECC Long discussion with the patient and her husband with an interpreter. Discussed that her short cervix limited the size of the biopsy.  Discussed the pathology report and need for f/u.    PLAN She is already scheduled for a f/u pap and ECC on 3/12 Will pretreat with cytotec We discussed using estrogen cream nightly for 2 weeks prior to her f/u appointment All of her questions were answered   An After Visit Summary was printed and given to the patient.  20 minutes face to face time of which over 50% was spent in counseling.

## 2016-02-15 NOTE — Patient Instructions (Addendum)
Use the premarin cream 0.5 grams every night (vaginally) for 2 weeks prior to your appointment in March (start February 26)  Use the cytotec tablets 6-12 hours prior to your appointment

## 2016-02-16 ENCOUNTER — Encounter: Payer: Self-pay | Admitting: Physical Therapy

## 2016-02-16 ENCOUNTER — Ambulatory Visit: Payer: Medicare Other | Admitting: Physical Therapy

## 2016-02-16 DIAGNOSIS — M25562 Pain in left knee: Secondary | ICD-10-CM | POA: Diagnosis not present

## 2016-02-16 DIAGNOSIS — G8929 Other chronic pain: Secondary | ICD-10-CM

## 2016-02-16 DIAGNOSIS — M25661 Stiffness of right knee, not elsewhere classified: Secondary | ICD-10-CM

## 2016-02-16 DIAGNOSIS — R262 Difficulty in walking, not elsewhere classified: Secondary | ICD-10-CM | POA: Diagnosis not present

## 2016-02-16 DIAGNOSIS — M25561 Pain in right knee: Secondary | ICD-10-CM | POA: Diagnosis not present

## 2016-02-16 NOTE — Therapy (Signed)
[facility] [address] [place], [place], [address] Phone: [phone]   Fax:  [phone]  Physical Therapy Treatment  Patient Details  Name: TAKIRA SHERRIN MRN: [id number] Date of Birth: 1945/08/19 Referring Provider: Darrelyn Hillock  Encounter Date: 02/16/2016      PT End of Session - 02/16/16 1058    Visit Number 3   Date for PT Re-Evaluation 04/09/16   PT Start Time 1012   PT Stop Time 1058   PT Time Calculation (min) 46 min   Activity Tolerance Patient tolerated treatment well   Behavior During Therapy [facility] for tasks assessed/performed      Past Medical History:  Diagnosis Date  . Acute bronchiolitis due to other infectious organisms   . Acute bronchiolitis due to respiratory syncytial virus (RSV)   . Arthritis    OA AND PAIN RIGHT KNEE  . Degeneration of intervertebral disc, site unspecified   . Disorder of bone and cartilage, unspecified   . Disturbance of skin sensation   . Encounter for long-term (current) use of other medications   . GERD (gastroesophageal reflux disease)    PT STATES SEVERE REFLUX IF SHE DOES NOT TAKE HER OMEPRAZOLE  . Headache(784.0)    WHEN REFLUX SEVERE  . Hypothyroidism   . Long term (current) use of anticoagulants   . Osteoarthrosis, unspecified whether generalized or localized, lower leg   . Osteoporosis, unspecified   . Other and unspecified hyperlipidemia   . Other atopic dermatitis and related conditions   . Other malaise and fatigue   . Other specified pre-operative examination 11/07/2011  . Thoracic or lumbosacral neuritis or radiculitis, unspecified   . Unspecified vitamin D deficiency   . Vegetarian diet     Past Surgical History:  Procedure Laterality Date  . CERVICAL BIOPSY  W/ LOOP ELECTRODE EXCISION    . TEETH EXTRACTIONS--NO OTHER SURGERY    . TOTAL KNEE ARTHROPLASTY  11/28/2011   Procedure: TOTAL KNEE ARTHROPLASTY;  Surgeon: Jacki Cones, MD;  Location:  [facility];  Service: Orthopedics;  Laterality: Right;  . TUBAL LIGATION      There were no vitals filed for this visit.      Subjective Assessment - 02/16/16 1012    Subjective "I still have a little pain"   Currently in Pain? Yes   Pain Score 4    Pain Location Knee   Pain Orientation Right                         [facility] Adult PT Treatment/Exercise - 02/16/16 0001      Knee/Hip Exercises: Aerobic   Nustep L3 x 6 min      Knee/Hip Exercises: Machines for Strengthening   Cybex Knee Flexion 15lb 2x10   Total Gym Leg Press 20lb 2x10     Knee/Hip Exercises: Standing   Lateral Step Up Both;Right;2 sets;10 reps;Hand Hold: 0;Step Height: 4"   Forward Step Up Right;2 sets;10 reps;Hand Hold: 0;Step Height: 4"     Knee/Hip Exercises: Seated   Sit to Sand 1 set;10 reps;without UE support     Modalities   Modalities Iontophoresis;Ultrasound     Ultrasound   Ultrasound Location R patella tendon   Ultrasound Parameters 3. 1.1w/cm2   Ultrasound Goals Pain     Iontophoresis   Type of Iontophoresis Dexamethasone   Location right patellar tendon area   Dose 80mA dose   Time 4 hour patch #1  PT Short Term Goals - 02/08/16 1054      PT SHORT TERM GOAL #1   Title independent with initial HEP   Time 2   Period Weeks   Status New           PT Long Term Goals - 02/16/16 1101      PT LONG TERM GOAL #1   Title decrease pain 50%   Status On-going     PT LONG TERM GOAL #2   Title increase ROM of the right knee to 0-116 degrees flexion   Status On-going     PT LONG TERM GOAL #3   Title go up and down stairs step over step   Status On-going     PT LONG TERM GOAL #4   Title decrease pain 50%   Status On-going               Plan - 02/16/16 1058    Clinical Impression Statement Pt with a progression to machine level interventions. Pt reports patella tendon pain with R knee flexion. Pt husband continues to talk over pt  and interpreter during treatment. Pt reports that ionto path does help to decrease pain some.   Rehab Potential Good   PT Frequency 2x / week   PT Duration 8 weeks   PT Treatment/Interventions Cryotherapy;Electrical Stimulation;Iontophoresis 4mg /ml Dexamethasone;Moist Heat;Ultrasound;Gait training;Functional mobility training;Patient/family education;Balance training;Therapeutic exercise;Therapeutic activities;Manual techniques;Taping;Vasopneumatic Device   PT Next Visit Plan  continue to look at the left thigh, start exercises      Patient will benefit from skilled therapeutic intervention in order to improve the following deficits and impairments:  Abnormal gait, Decreased mobility, Decreased range of motion, Decreased strength, Difficulty walking, Impaired flexibility, Pain  Visit Diagnosis: Chronic pain of right knee  Chronic pain of left knee  Difficulty in walking, not elsewhere classified  Stiffness of right knee, not elsewhere classified     Problem List Patient Active Problem List   Diagnosis Date Noted  . Thyroid activity decreased 11/19/2014  . Pain, joint, multiple sites 11/19/2014  . Numbness and tingling of left leg 04/06/2014  . Routine general medical examination at a health care facility 09/29/2013  . Osteoporosis, post-menopausal 04/21/2013  . Cough variant asthma 03/17/2013  . Psoriasis 03/17/2013  . Persistent dry cough 03/17/2013  . Osteoarthritis of left knee 03/03/2013  . Psoriasis-like skin disease 03/03/2013  . Eczema 01/07/2013  . Need for prophylactic vaccination and inoculation against influenza 01/07/2013  . Prediabetes 12/17/2012  . Cough 12/17/2012  . Reactive airway disease with wheezing 12/03/2012  . URI, acute 12/03/2012  . Hypothyroidism 10/29/2012  . Unspecified vitamin D deficiency 10/29/2012  . Hyperlipidemia LDL goal <130 10/29/2012  . GERD (gastroesophageal reflux disease) 10/29/2012  . Osteoporosis 10/29/2012  . Myalgia and  myositis 10/29/2012    Grayce Sessionsonald G Jennette Leask, PTA 02/16/2016, 11:02 AM  [facility] [address] [place], [place], [address] Phone: [phone]   Fax:  [phone]  Name: Eston MouldSudhaben M Paiva MRN: [id number] Date of Birth: 05/03/1946

## 2016-02-21 ENCOUNTER — Encounter: Payer: Self-pay | Admitting: Physical Therapy

## 2016-02-21 ENCOUNTER — Ambulatory Visit: Payer: Medicare Other | Admitting: Physical Therapy

## 2016-02-21 DIAGNOSIS — M25561 Pain in right knee: Secondary | ICD-10-CM | POA: Diagnosis not present

## 2016-02-21 DIAGNOSIS — G8929 Other chronic pain: Secondary | ICD-10-CM

## 2016-02-21 DIAGNOSIS — R262 Difficulty in walking, not elsewhere classified: Secondary | ICD-10-CM

## 2016-02-21 DIAGNOSIS — M25562 Pain in left knee: Secondary | ICD-10-CM

## 2016-02-21 DIAGNOSIS — M25661 Stiffness of right knee, not elsewhere classified: Secondary | ICD-10-CM | POA: Diagnosis not present

## 2016-02-21 NOTE — Therapy (Signed)
[facility] [address] [place], [place], [address] Phone: [phone]   Fax:  [phone]  Physical Therapy Treatment  Patient Details  Name: [name] [name] MRN: [id number] Date of Birth: 12-25-1945 Referring Provider: Darrelyn Hillock  Encounter Date: 02/21/2016      PT End of Session - 02/21/16 1058    Visit Number 4   Date for PT Re-Evaluation 04/09/16   PT Start Time 1015   PT Stop Time 1058   PT Time Calculation (min) 43 min   Activity Tolerance Patient tolerated treatment well   Behavior During Therapy [facility] for tasks assessed/performed      Past Medical History:  Diagnosis Date  . Acute bronchiolitis due to other infectious organisms   . Acute bronchiolitis due to respiratory syncytial virus (RSV)   . Arthritis    OA AND PAIN RIGHT KNEE  . Degeneration of intervertebral disc, site unspecified   . Disorder of bone and cartilage, unspecified   . Disturbance of skin sensation   . Encounter for long-term (current) use of other medications   . GERD (gastroesophageal reflux disease)    PT STATES SEVERE REFLUX IF SHE DOES NOT TAKE HER OMEPRAZOLE  . Headache(784.0)    WHEN REFLUX SEVERE  . Hypothyroidism   . Long term (current) use of anticoagulants   . Osteoarthrosis, unspecified whether generalized or localized, lower leg   . Osteoporosis, unspecified   . Other and unspecified hyperlipidemia   . Other atopic dermatitis and related conditions   . Other malaise and fatigue   . Other specified pre-operative examination 11/07/2011  . Thoracic or lumbosacral neuritis or radiculitis, unspecified   . Unspecified vitamin D deficiency   . Vegetarian diet     Past Surgical History:  Procedure Laterality Date  . CERVICAL BIOPSY  W/ LOOP ELECTRODE EXCISION    . TEETH EXTRACTIONS--NO OTHER SURGERY    . TOTAL KNEE ARTHROPLASTY  11/28/2011   Procedure: TOTAL KNEE ARTHROPLASTY;  Surgeon: Jacki Cones, MD;  Location:  [facility];  Service: Orthopedics;  Laterality: Right;  . TUBAL LIGATION      There were no vitals filed for this visit.      Subjective Assessment - 02/21/16 1015    Subjective Pt husband reports that pt had more pain than usual yesterday but today it is better   Currently in Pain? Yes   Pain Score 4    Pain Location Knee   Pain Orientation Right                         [facility] Adult PT Treatment/Exercise - 02/21/16 0001      Knee/Hip Exercises: Stretches   Lobbyist Right;5 reps;10 seconds   Other Knee/Hip Stretches Thomas stretch 6 x 10 sec     Knee/Hip Exercises: Aerobic   Stationary Bike L0 x6 min     Modalities   Modalities Electrical Stimulation;Moist Heat     Moist Heat Therapy   Number Minutes Moist Heat 15 Minutes   Moist Heat Location Other (comment)  R quad to R knee      Electrical Stimulation   Electrical Stimulation Location R quad & R patella lagament    Electrical Stimulation Action pre mod   Electrical Stimulation Parameters Pt tolerance      Iontophoresis   Type of Iontophoresis Dexamethasone   Location right patellar tendon area   Dose 80mA dose   Time 4 hour  patch #1     Manual Therapy   Manual Therapy Soft tissue mobilization   Manual therapy comments R quad                  PT Short Term Goals - 02/08/16 1054      PT SHORT TERM GOAL #1   Title independent with initial HEP   Time 2   Period Weeks   Status New           PT Long Term Goals - 02/16/16 1101      PT LONG TERM GOAL #1   Title decrease pain 50%   Status On-going     PT LONG TERM GOAL #2   Title increase ROM of the right knee to 0-116 degrees flexion   Status On-going     PT LONG TERM GOAL #3   Title go up and down stairs step over step   Status On-going     PT LONG TERM GOAL #4   Title decrease pain 50%   Status On-going               Plan - 02/21/16 1058    Clinical Impression Statement more passive treatment due to pt  reports of increase pain yesterday. Pt with reports patella tendon pain with R knee flexion. Pt also reported some pain in R quad. Some density noted in R quad with palpation. Pt tolerated today's treatment well, does report some quad pain with STM.  Pt instructed to do thomas stretch at home.    Rehab Potential Good   PT Frequency 2x / week   PT Duration 8 weeks   PT Treatment/Interventions Cryotherapy;Electrical Stimulation;Iontophoresis 4mg /ml Dexamethasone;Moist Heat;Ultrasound;Gait training;Functional mobility training;Patient/family education;Balance training;Therapeutic exercise;Therapeutic activities;Manual techniques;Taping;Vasopneumatic Device   PT Next Visit Plan Check compliance of quad stretch and assess treatment      Patient will benefit from skilled therapeutic intervention in order to improve the following deficits and impairments:  Abnormal gait, Decreased mobility, Decreased range of motion, Decreased strength, Difficulty walking, Impaired flexibility, Pain  Visit Diagnosis: Chronic pain of right knee  Chronic pain of left knee  Difficulty in walking, not elsewhere classified  Stiffness of right knee, not elsewhere classified     Problem List Patient Active Problem List   Diagnosis Date Noted  . Thyroid activity decreased 11/19/2014  . Pain, joint, multiple sites 11/19/2014  . Numbness and tingling of left leg 04/06/2014  . Routine general medical examination at a health care facility 09/29/2013  . Osteoporosis, post-menopausal 04/21/2013  . Cough variant asthma 03/17/2013  . Psoriasis 03/17/2013  . Persistent dry cough 03/17/2013  . Osteoarthritis of left knee 03/03/2013  . Psoriasis-like skin disease 03/03/2013  . Eczema 01/07/2013  . Need for prophylactic vaccination and inoculation against influenza 01/07/2013  . Prediabetes 12/17/2012  . Cough 12/17/2012  . Reactive airway disease with wheezing 12/03/2012  . URI, acute 12/03/2012  . Hypothyroidism  10/29/2012  . Unspecified vitamin D deficiency 10/29/2012  . Hyperlipidemia LDL goal <130 10/29/2012  . GERD (gastroesophageal reflux disease) 10/29/2012  . Osteoporosis 10/29/2012  . Myalgia and myositis 10/29/2012    Grayce Sessionsonald G Anju Sereno, PTA  02/21/2016, 11:05 AM  [facility] [address] [place], [place], [address] Phone: [phone]   Fax:  [phone]  Name: Eston MouldSudhaben M Lince MRN: [id number] Date of Birth: 08/17/1945

## 2016-02-23 ENCOUNTER — Ambulatory Visit: Payer: Medicare Other | Admitting: Physical Therapy

## 2016-02-23 ENCOUNTER — Encounter: Payer: Self-pay | Admitting: Physical Therapy

## 2016-02-23 DIAGNOSIS — R262 Difficulty in walking, not elsewhere classified: Secondary | ICD-10-CM

## 2016-02-23 DIAGNOSIS — M25562 Pain in left knee: Secondary | ICD-10-CM

## 2016-02-23 DIAGNOSIS — G8929 Other chronic pain: Secondary | ICD-10-CM

## 2016-02-23 DIAGNOSIS — M25661 Stiffness of right knee, not elsewhere classified: Secondary | ICD-10-CM

## 2016-02-23 DIAGNOSIS — M25561 Pain in right knee: Secondary | ICD-10-CM | POA: Diagnosis not present

## 2016-02-23 NOTE — Therapy (Signed)
[place], [place], [address] Phone: [phone]   Fax:  [phone]  Physical Therapy Treatment  Patient Details  Name: [name] [name] MRN: [id number] Date of Birth: Sep 12, 1945 Referring Provider: Gladstone Lighter  Encounter Date: 02/23/2016      PT End of Session - 02/23/16 1008    Visit Number 5   Date for PT Re-Evaluation 04/09/16   PT Start Time 0930   PT Stop Time 1010   PT Time Calculation (min) 40 min   Activity Tolerance Patient tolerated treatment well   Behavior During Therapy [facility] for tasks assessed/performed      Past Medical History:  Diagnosis Date  . Acute bronchiolitis due to other infectious organisms   . Acute bronchiolitis due to respiratory syncytial virus (RSV)   . Arthritis    OA AND PAIN RIGHT KNEE  . Degeneration of intervertebral disc, site unspecified   . Disorder of bone and cartilage, unspecified   . Disturbance of skin sensation   . Encounter for long-term (current) use of other medications   . GERD (gastroesophageal reflux disease)    PT STATES SEVERE REFLUX IF SHE DOES NOT TAKE HER OMEPRAZOLE  . Headache(784.0)    WHEN REFLUX SEVERE  . Hypothyroidism   . Long term (current) use of anticoagulants   . Osteoarthrosis, unspecified whether generalized or localized, lower leg   . Osteoporosis, unspecified   . Other and unspecified hyperlipidemia   . Other atopic dermatitis and related conditions   . Other malaise and fatigue   . Other specified pre-operative examination 11/07/2011  . Thoracic or lumbosacral neuritis or radiculitis, unspecified   . Unspecified vitamin D deficiency   . Vegetarian diet     Past Surgical History:  Procedure Laterality Date  . CERVICAL BIOPSY  W/ LOOP ELECTRODE EXCISION    . TEETH EXTRACTIONS--NO OTHER SURGERY    . TOTAL KNEE ARTHROPLASTY  11/28/2011   Procedure: TOTAL KNEE ARTHROPLASTY;  Surgeon: Tobi Bastos, MD;  Location:  [facility];  Service: Orthopedics;  Laterality: Right;  . TUBAL LIGATION      There were no vitals filed for this visit.      Subjective Assessment - 02/23/16 0926    Subjective Pt husband reports that pt is feeling much better, She has been doing her stretches and using her cycle at home    Currently in Pain? Yes   Pain Score 3    Pain Location Knee   Pain Orientation Right   Pain Type Chronic pain                         [facility] Adult PT Treatment/Exercise - 02/23/16 0001      Knee/Hip Exercises: Stretches   Sports administrator Right;5 reps;10 seconds     Knee/Hip Exercises: Aerobic   Stationary Bike L0 x6 min     Iontophoresis   Type of Iontophoresis Dexamethasone   Location right patellar tendon area   Dose 42m dose   Time 4 hour patch #1     Manual Therapy   Manual Therapy Soft tissue mobilization;Passive ROM   Manual therapy comments R quad, knots noted in R quad   Soft tissue mobilization R quad   Passive ROM R knee flexion                  PT Short Term Goals - 02/08/16 1054      PT  SHORT TERM GOAL #1   Title independent with initial HEP   Time 2   Period Weeks   Status New           PT Long Term Goals - 02/23/16 1013      PT LONG TERM GOAL #1   Title decrease pain 50%   Status Partially Met     PT LONG TERM GOAL #2   Title increase ROM of the right knee to 0-116 degrees flexion   Status On-going     PT LONG TERM GOAL #3   Title go up and down stairs step over step   Status On-going     PT LONG TERM GOAL #4   Title decrease pain 50%   Status On-going               Plan - 02/23/16 1009    Clinical Impression Statement Continues with a more passive treatment due to pt reports that she felt better. Trigger point noted in R quad during manual therapy. Pt reports that she can walk and sit without pain, she stated she only has pain when she is sitting and pulls her leg back causing more knee flexion. Pt reports that her  knee hurts just below the R patella. Pt instructed to stop pulling her leg back causing increase knee flexion; possibly causing more irritation on patella ligament attachment point   Rehab Potential Good   PT Frequency 2x / week   PT Duration 8 weeks   PT Treatment/Interventions Cryotherapy;Electrical Stimulation;Iontophoresis 55m/ml Dexamethasone;Moist Heat;Ultrasound;Gait training;Functional mobility training;Patient/family education;Balance training;Therapeutic exercise;Therapeutic activities;Manual techniques;Taping;Vasopneumatic Device   PT Next Visit Plan assess Tx      Patient will benefit from skilled therapeutic intervention in order to improve the following deficits and impairments:  Abnormal gait, Decreased mobility, Decreased range of motion, Decreased strength, Difficulty walking, Impaired flexibility, Pain  Visit Diagnosis: Chronic pain of right knee  Chronic pain of left knee  Difficulty in walking, not elsewhere classified  Stiffness of right knee, not elsewhere classified     Problem List Patient Active Problem List   Diagnosis Date Noted  . Thyroid activity decreased 11/19/2014  . Pain, joint, multiple sites 11/19/2014  . Numbness and tingling of left leg 04/06/2014  . Routine general medical examination at a health care facility 09/29/2013  . Osteoporosis, post-menopausal 04/21/2013  . Cough variant asthma 03/17/2013  . Psoriasis 03/17/2013  . Persistent dry cough 03/17/2013  . Osteoarthritis of left knee 03/03/2013  . Psoriasis-like skin disease 03/03/2013  . Eczema 01/07/2013  . Need for prophylactic vaccination and inoculation against influenza 01/07/2013  . Prediabetes 12/17/2012  . Cough 12/17/2012  . Reactive airway disease with wheezing 12/03/2012  . URI, acute 12/03/2012  . Hypothyroidism 10/29/2012  . Unspecified vitamin D deficiency 10/29/2012  . Hyperlipidemia LDL goal <130 10/29/2012  . GERD (gastroesophageal reflux disease) 10/29/2012  .  Osteoporosis 10/29/2012  . Myalgia and myositis 10/29/2012    RScot Jun PTA  02/23/2016, 10:15 AM  [place] [address]Phone: [phone]  Fax:  [phone] Name: [name] [name]: [id number] of Birth: 310-10-1945

## 2016-02-24 ENCOUNTER — Ambulatory Visit (INDEPENDENT_AMBULATORY_CARE_PROVIDER_SITE_OTHER): Payer: Medicare Other

## 2016-02-24 DIAGNOSIS — Z23 Encounter for immunization: Secondary | ICD-10-CM | POA: Diagnosis not present

## 2016-02-27 ENCOUNTER — Telehealth: Payer: Self-pay | Admitting: *Deleted

## 2016-02-27 DIAGNOSIS — Z0100 Encounter for examination of eyes and vision without abnormal findings: Secondary | ICD-10-CM

## 2016-02-27 NOTE — Telephone Encounter (Signed)
Referral placed.

## 2016-02-27 NOTE — Telephone Encounter (Signed)
Patient stated that she needs a referral to the Eye doctor due to insurance. Would like a referral set up to Dr. Sondra BargesForsey for a routine eye exam. Already established with his office. Please Advise.

## 2016-02-27 NOTE — Telephone Encounter (Signed)
Ok for referral?

## 2016-02-28 ENCOUNTER — Ambulatory Visit: Payer: Medicare Other | Admitting: Physical Therapy

## 2016-02-28 ENCOUNTER — Encounter: Payer: Self-pay | Admitting: Physical Therapy

## 2016-02-28 DIAGNOSIS — M25661 Stiffness of right knee, not elsewhere classified: Secondary | ICD-10-CM

## 2016-02-28 DIAGNOSIS — G8929 Other chronic pain: Secondary | ICD-10-CM

## 2016-02-28 DIAGNOSIS — M25561 Pain in right knee: Principal | ICD-10-CM

## 2016-02-28 DIAGNOSIS — R262 Difficulty in walking, not elsewhere classified: Secondary | ICD-10-CM | POA: Diagnosis not present

## 2016-02-28 DIAGNOSIS — M25562 Pain in left knee: Secondary | ICD-10-CM | POA: Diagnosis not present

## 2016-02-28 NOTE — Therapy (Signed)
[place], [place], [address] Phone: [phone]   Fax:  [phone]  Physical Therapy Treatment  Patient Details  Name: [name] [name] MRN: [id number] Date of Birth: 10/17/1945 Referring Provider: Gladstone Lighter  Encounter Date: 02/28/2016      PT End of Session - 02/28/16 1145    Visit Number 6   Date for PT Re-Evaluation 04/09/16   PT Start Time 1100   PT Stop Time 1155   PT Time Calculation (min) 55 min      Past Medical History:  Diagnosis Date  . Acute bronchiolitis due to other infectious organisms   . Acute bronchiolitis due to respiratory syncytial virus (RSV)   . Arthritis    OA AND PAIN RIGHT KNEE  . Degeneration of intervertebral disc, site unspecified   . Disorder of bone and cartilage, unspecified   . Disturbance of skin sensation   . Encounter for long-term (current) use of other medications   . GERD (gastroesophageal reflux disease)    PT STATES SEVERE REFLUX IF SHE DOES NOT TAKE HER OMEPRAZOLE  . Headache(784.0)    WHEN REFLUX SEVERE  . Hypothyroidism   . Long term (current) use of anticoagulants   . Osteoarthrosis, unspecified whether generalized or localized, lower leg   . Osteoporosis, unspecified   . Other and unspecified hyperlipidemia   . Other atopic dermatitis and related conditions   . Other malaise and fatigue   . Other specified pre-operative examination 11/07/2011  . Thoracic or lumbosacral neuritis or radiculitis, unspecified   . Unspecified vitamin D deficiency   . Vegetarian diet     Past Surgical History:  Procedure Laterality Date  . CERVICAL BIOPSY  W/ LOOP ELECTRODE EXCISION    . TEETH EXTRACTIONS--NO OTHER SURGERY    . TOTAL KNEE ARTHROPLASTY  11/28/2011   Procedure: TOTAL KNEE ARTHROPLASTY;  Surgeon: Tobi Bastos, MD;  Location: [facility];  Service: Orthopedics;  Laterality: Right;  . TUBAL LIGATION      There were no vitals filed for this  visit.      Subjective Assessment - 02/28/16 1106    Subjective pt reports same. husband reports better. interperter talks more to husband than pt. PTA has to ask interperter to translate to pt. Pt rates pain differnet than husband. Both verb no pain walking or sit to stand, only with increased flexion.   Patient is accompained by: Family member;Interpreter   Currently in Pain? Yes   Pain Score 5    Pain Location Knee   Pain Orientation Right            [facility] PT Assessment - 02/28/16 0001      AROM   Overall AROM Comments AROM RT knee 1-22     Strength   Overall Strength Comments Rt knee 4+/5                     [facility] Adult PT Treatment/Exercise - 02/28/16 0001      Knee/Hip Exercises: Aerobic   Stationary Bike L0 x6 min     Knee/Hip Exercises: Seated   Long Arc Quad Strengthening;Right;2 sets;10 reps;Weights  c/o quad pain   Long Arc Quad Weight 3 lbs.   Hamstring Curl Right;2 sets;10 reps  green tband- cuing to pull straight back into flex     Ultrasound   Ultrasound Location rt pat tendon and distal ITB   Ultrasound Goals Pain  tightness  Iontophoresis   Type of Iontophoresis Dexamethasone   Location right patellar tendon area   Dose 47m dose   Time 4 hour patch #4     Manual Therapy   Manual Therapy Soft tissue mobilization;Taping;Joint mobilization   Manual therapy comments tight and tender RT lat quad and ITB   Joint Mobilization RT knee    Soft tissue mobilization RT quad/ITB                  PT Short Term Goals - 02/28/16 1115      PT SHORT TERM GOAL #1   Title independent with initial HEP   Status Achieved           PT Long Term Goals - 02/28/16 1115      PT LONG TERM GOAL #1   Title decrease pain 50%   Baseline 40% better   Status Partially Met     PT LONG TERM GOAL #2   Title increase ROM of the right knee to 0-116 degrees flexion   Status Achieved     PT LONG TERM GOAL #3   Title go up and down  stairs step over step   Status Partially Met               Plan - 02/28/16 1145    Clinical Impression Statement pain decreased 40%, ROM goal met. MMT 4+/5. tightness RT quad and ITB trial of kinesio tape after STW.   PT Next Visit Plan asess tape, possible ITB stripping      Patient will benefit from skilled therapeutic intervention in order to improve the following deficits and impairments:  Abnormal gait, Decreased mobility, Decreased range of motion, Decreased strength, Difficulty walking, Impaired flexibility, Pain  Visit Diagnosis: Chronic pain of right knee  Difficulty in walking, not elsewhere classified  Stiffness of right knee, not elsewhere classified     Problem List Patient Active Problem List   Diagnosis Date Noted  . Thyroid activity decreased 11/19/2014  . Pain, joint, multiple sites 11/19/2014  . Numbness and tingling of left leg 04/06/2014  . Routine general medical examination at a health care facility 09/29/2013  . Osteoporosis, post-menopausal 04/21/2013  . Cough variant asthma 03/17/2013  . Psoriasis 03/17/2013  . Persistent dry cough 03/17/2013  . Osteoarthritis of left knee 03/03/2013  . Psoriasis-like skin disease 03/03/2013  . Eczema 01/07/2013  . Need for prophylactic vaccination and inoculation against influenza 01/07/2013  . Prediabetes 12/17/2012  . Cough 12/17/2012  . Reactive airway disease with wheezing 12/03/2012  . URI, acute 12/03/2012  . Hypothyroidism 10/29/2012  . Unspecified vitamin D deficiency 10/29/2012  . Hyperlipidemia LDL goal <130 10/29/2012  . GERD (gastroesophageal reflux disease) 10/29/2012  . Osteoporosis 10/29/2012  . Myalgia and myositis 10/29/2012    PAYSEUR,ANGIE PTA 02/28/2016, 11:47 AM  [place] [address]Phone: [phone]  Fax:  [phone] Name: SALIYAH ABEYTAMRN: [id number] of Birth:  301-Sep-1947

## 2016-03-01 ENCOUNTER — Encounter: Payer: Self-pay | Admitting: Physical Therapy

## 2016-03-01 ENCOUNTER — Ambulatory Visit: Payer: Medicare Other | Admitting: Physical Therapy

## 2016-03-01 DIAGNOSIS — M25561 Pain in right knee: Secondary | ICD-10-CM | POA: Diagnosis not present

## 2016-03-01 DIAGNOSIS — R262 Difficulty in walking, not elsewhere classified: Secondary | ICD-10-CM

## 2016-03-01 DIAGNOSIS — G8929 Other chronic pain: Secondary | ICD-10-CM

## 2016-03-01 DIAGNOSIS — M25562 Pain in left knee: Secondary | ICD-10-CM | POA: Diagnosis not present

## 2016-03-01 DIAGNOSIS — M25661 Stiffness of right knee, not elsewhere classified: Secondary | ICD-10-CM | POA: Diagnosis not present

## 2016-03-01 NOTE — Therapy (Signed)
[place], [place], [address] Phone: [phone]   Fax:  [phone]  Physical Therapy Treatment  Patient Details  Name: [name] [name] MRN: [id number] Date of Birth: 05/26/1945 Referring Provider: Gladstone Lighter  Encounter Date: 03/01/2016      PT End of Session - 03/01/16 1017    Visit Number 7   Date for PT Re-Evaluation 04/09/16   PT Start Time 0928   PT Stop Time 1016   PT Time Calculation (min) 48 min   Activity Tolerance Patient tolerated treatment well   Behavior During Therapy [facility] for tasks assessed/performed      Past Medical History:  Diagnosis Date  . Acute bronchiolitis due to other infectious organisms   . Acute bronchiolitis due to respiratory syncytial virus (RSV)   . Arthritis    OA AND PAIN RIGHT KNEE  . Degeneration of intervertebral disc, site unspecified   . Disorder of bone and cartilage, unspecified   . Disturbance of skin sensation   . Encounter for long-term (current) use of other medications   . GERD (gastroesophageal reflux disease)    PT STATES SEVERE REFLUX IF SHE DOES NOT TAKE HER OMEPRAZOLE  . Headache(784.0)    WHEN REFLUX SEVERE  . Hypothyroidism   . Long term (current) use of anticoagulants   . Osteoarthrosis, unspecified whether generalized or localized, lower leg   . Osteoporosis, unspecified   . Other and unspecified hyperlipidemia   . Other atopic dermatitis and related conditions   . Other malaise and fatigue   . Other specified pre-operative examination 11/07/2011  . Thoracic or lumbosacral neuritis or radiculitis, unspecified   . Unspecified vitamin D deficiency   . Vegetarian diet     Past Surgical History:  Procedure Laterality Date  . CERVICAL BIOPSY  W/ LOOP ELECTRODE EXCISION    . TEETH EXTRACTIONS--NO OTHER SURGERY    . TOTAL KNEE ARTHROPLASTY  11/28/2011   Procedure: TOTAL KNEE ARTHROPLASTY;  Surgeon: Tobi Bastos, MD;  Location:  [facility];  Service: Orthopedics;  Laterality: Right;  . TUBAL LIGATION      There were no vitals filed for this visit.      Subjective Assessment - 03/01/16 1013    Subjective Reports that she thought the tape felt good, still c/o pain over the patellar tendon area and the distal ITB laterally   Currently in Pain? Yes   Pain Score 5    Pain Location Knee                         [facility] Adult PT Treatment/Exercise - 03/01/16 0001      Knee/Hip Exercises: Stretches   Passive Hamstring Stretch 4 reps;20 seconds   Quad Stretch 4 reps;20 seconds   Piriformis Stretch 4 reps;20 seconds   Soleus Stretch Limitations ITB stretches     Knee/Hip Exercises: Aerobic   Stationary Bike L0 x6 min     Ultrasound   Ultrasound Location right patellar tendon and distal lateral ITB   Ultrasound Parameters 3.3 MHz 1.4 w/cm2   Ultrasound Goals Pain     Iontophoresis   Type of Iontophoresis Dexamethasone   Location right patellar tendon area   Dose 66m dose   Time 4 hour patch #5     Manual Therapy   Manual Therapy Soft tissue mobilization;Taping;Joint mobilization   Manual therapy comments tight and tender RT lat quad and ITB   Joint Mobilization  RT knee    Soft tissue mobilization right ITB with hands and roller                  PT Short Term Goals - 02/28/16 1115      PT SHORT TERM GOAL #1   Title independent with initial HEP   Status Achieved           PT Long Term Goals - 02/28/16 1115      PT LONG TERM GOAL #1   Title decrease pain 50%   Baseline 40% better   Status Partially Met     PT LONG TERM GOAL #2   Title increase ROM of the right knee to 0-116 degrees flexion   Status Achieved     PT LONG TERM GOAL #3   Title go up and down stairs step over step   Status Partially Met               Plan - 03/01/16 1020    Clinical Impression Statement Very tight and tender distal ITB, she has difficulty relaxing with stretches needing cues  to relax.  Had to be light with the STM and the rolling due to pain   PT Next Visit Plan see if ITB stripping helped   Consulted and Agree with Plan of Care Patient      Patient will benefit from skilled therapeutic intervention in order to improve the following deficits and impairments:  Abnormal gait, Decreased mobility, Decreased range of motion, Decreased strength, Difficulty walking, Impaired flexibility, Pain  Visit Diagnosis: Chronic pain of right knee  Difficulty in walking, not elsewhere classified  Stiffness of right knee, not elsewhere classified     Problem List Patient Active Problem List   Diagnosis Date Noted  . Thyroid activity decreased 11/19/2014  . Pain, joint, multiple sites 11/19/2014  . Numbness and tingling of left leg 04/06/2014  . Routine general medical examination at a health care facility 09/29/2013  . Osteoporosis, post-menopausal 04/21/2013  . Cough variant asthma 03/17/2013  . Psoriasis 03/17/2013  . Persistent dry cough 03/17/2013  . Osteoarthritis of left knee 03/03/2013  . Psoriasis-like skin disease 03/03/2013  . Eczema 01/07/2013  . Need for prophylactic vaccination and inoculation against influenza 01/07/2013  . Prediabetes 12/17/2012  . Cough 12/17/2012  . Reactive airway disease with wheezing 12/03/2012  . URI, acute 12/03/2012  . Hypothyroidism 10/29/2012  . Unspecified vitamin D deficiency 10/29/2012  . Hyperlipidemia LDL goal <130 10/29/2012  . GERD (gastroesophageal reflux disease) 10/29/2012  . Osteoporosis 10/29/2012  . Myalgia and myositis 10/29/2012    Sumner Boast., PT 03/01/2016, 10:22 AM  [facility] [place] [address] [place], [place], [address] Phone: [phone]   Fax:  [phone]  Name: [name] [name] MRN: [id number] Date of Birth: 1946-04-27

## 2016-03-07 ENCOUNTER — Ambulatory Visit: Payer: Medicare Other | Attending: Orthopedic Surgery | Admitting: Physical Therapy

## 2016-03-07 ENCOUNTER — Encounter: Payer: Self-pay | Admitting: Physical Therapy

## 2016-03-07 DIAGNOSIS — M25561 Pain in right knee: Secondary | ICD-10-CM | POA: Insufficient documentation

## 2016-03-07 DIAGNOSIS — M25562 Pain in left knee: Secondary | ICD-10-CM | POA: Diagnosis not present

## 2016-03-07 DIAGNOSIS — R262 Difficulty in walking, not elsewhere classified: Secondary | ICD-10-CM | POA: Insufficient documentation

## 2016-03-07 DIAGNOSIS — M25661 Stiffness of right knee, not elsewhere classified: Secondary | ICD-10-CM | POA: Insufficient documentation

## 2016-03-07 DIAGNOSIS — G8929 Other chronic pain: Secondary | ICD-10-CM | POA: Diagnosis not present

## 2016-03-07 NOTE — Therapy (Signed)
[place], [place], [address] Phone: [phone]   Fax:  [phone]  Physical Therapy Treatment  Patient Details  Name: [name] [name] MRN: [id number] Date of Birth: [birth year] Referring Provider: Gladstone Lighter  Encounter Date: 03/07/2016      PT End of Session - 03/07/16 1054    Visit Number 8   Date for PT Re-Evaluation 04/09/16   PT Start Time 1010   PT Stop Time 1052   PT Time Calculation (min) 42 min   Activity Tolerance Patient tolerated treatment well   Behavior During Therapy [facility] for tasks assessed/performed      Past Medical History:  Diagnosis Date  . Acute bronchiolitis due to other infectious organisms   . Acute bronchiolitis due to respiratory syncytial virus (RSV)   . Arthritis    OA AND PAIN RIGHT KNEE  . Degeneration of intervertebral disc, site unspecified   . Disorder of bone and cartilage, unspecified   . Disturbance of skin sensation   . Encounter for long-term (current) use of other medications   . GERD (gastroesophageal reflux disease)    PT STATES SEVERE REFLUX IF SHE DOES NOT TAKE HER OMEPRAZOLE  . Headache(784.0)    WHEN REFLUX SEVERE  . Hypothyroidism   . Long term (current) use of anticoagulants   . Osteoarthrosis, unspecified whether generalized or localized, lower leg   . Osteoporosis, unspecified   . Other and unspecified hyperlipidemia   . Other atopic dermatitis and related conditions   . Other malaise and fatigue   . Other specified pre-operative examination 11/07/2011  . Thoracic or lumbosacral neuritis or radiculitis, unspecified   . Unspecified vitamin D deficiency   . Vegetarian diet     Past Surgical History:  Procedure Laterality Date  . CERVICAL BIOPSY  W/ LOOP ELECTRODE EXCISION    . TEETH EXTRACTIONS--NO OTHER SURGERY    . TOTAL KNEE ARTHROPLASTY  11/28/2011   Procedure: TOTAL KNEE ARTHROPLASTY;  Surgeon: Tobi Bastos, MD;  Location: [facility];  Service: Orthopedics;  Laterality: Right;  . TUBAL LIGATION      There were no vitals filed for this visit.      Subjective Assessment - 03/07/16 1007    Subjective "Its better but I still have pain"   Currently in Pain? Yes   Pain Score 3    Pain Location Knee   Pain Orientation Right                         [facility] Adult PT Treatment/Exercise - 03/07/16 0001      Knee/Hip Exercises: Stretches   Passive Hamstring Stretch 4 reps;20 seconds;Right   Quad Stretch 4 reps;20 seconds;Right   Soleus Stretch Limitations ITB stretches     Knee/Hip Exercises: Aerobic   Stationary Bike L0 x6 min     Knee/Hip Exercises: Machines for Strengthening   Total Gym Leg Press 20lb 2x10     Ultrasound   Ultrasound Location R patellar ligament   Ultrasound Parameters 3.3MHz 1.1w/cm2   Ultrasound Goals Pain     Manual Therapy   Manual Therapy Soft tissue mobilization;Joint mobilization;Taping   Manual therapy comments tight and tender RT lat quad and ITB   Joint Mobilization RT knee    Soft tissue mobilization R quad                  PT Short Term Goals - 02/28/16 1115  PT SHORT TERM GOAL #1   Title independent with initial HEP   Status Achieved           PT Long Term Goals - 03/07/16 1057      PT LONG TERM GOAL #1   Title decrease pain 50%   Status Partially Met     PT LONG TERM GOAL #2   Title increase ROM of the right knee to 0-116 degrees flexion   Status Achieved     PT LONG TERM GOAL #3   Title go up and down stairs step over step   Status Partially Met     PT LONG TERM GOAL #4   Title decrease pain 50%   Status On-going               Plan - 03/07/16 1054    Clinical Impression Statement Pt has difficulty relaxing during passive stretching requiring multiple cues. No relief from ITB taping. Reports that she feels better but continues to have pain when she bends. Pt continues to have tenderness below R patella   Rehab  Potential Good   PT Frequency 2x / week   PT Duration 8 weeks   PT Treatment/Interventions Cryotherapy;Electrical Stimulation;Iontophoresis 28m/ml Dexamethasone;Moist Heat;Ultrasound;Gait training;Functional mobility training;Patient/family education;Balance training;Therapeutic exercise;Therapeutic activities;Manual techniques;Taping;Vasopneumatic Device   PT Next Visit Plan assess tape below patella      Patient will benefit from skilled therapeutic intervention in order to improve the following deficits and impairments:  Abnormal gait, Decreased mobility, Decreased range of motion, Decreased strength, Difficulty walking, Impaired flexibility, Pain  Visit Diagnosis: Chronic pain of right knee  Difficulty in walking, not elsewhere classified  Stiffness of right knee, not elsewhere classified  Chronic pain of left knee     Problem List Patient Active Problem List   Diagnosis Date Noted  . Thyroid activity decreased 11/19/2014  . Pain, joint, multiple sites 11/19/2014  . Numbness and tingling of left leg 04/06/2014  . Routine general medical examination at a health care facility 09/29/2013  . Osteoporosis, post-menopausal 04/21/2013  . Cough variant asthma 03/17/2013  . Psoriasis 03/17/2013  . Persistent dry cough 03/17/2013  . Osteoarthritis of left knee 03/03/2013  . Psoriasis-like skin disease 03/03/2013  . Eczema 01/07/2013  . Need for prophylactic vaccination and inoculation against influenza 01/07/2013  . Prediabetes 12/17/2012  . Cough 12/17/2012  . Reactive airway disease with wheezing 12/03/2012  . URI, acute 12/03/2012  . Hypothyroidism 10/29/2012  . Unspecified vitamin D deficiency 10/29/2012  . Hyperlipidemia LDL goal <130 10/29/2012  . GERD (gastroesophageal reflux disease) 10/29/2012  . Osteoporosis 10/29/2012  . Myalgia and myositis 10/29/2012    RScot Jun PTA  03/07/2016, 10:59 AM  [place] [address]Phone: [phone]  Fax:  [phone] Name: SMACEY WURTZMRN: [id number] of Birth: 3February 23, 1947

## 2016-03-09 ENCOUNTER — Ambulatory Visit: Payer: Medicare Other | Admitting: Physical Therapy

## 2016-03-09 ENCOUNTER — Encounter: Payer: Self-pay | Admitting: Physical Therapy

## 2016-03-09 DIAGNOSIS — M25661 Stiffness of right knee, not elsewhere classified: Secondary | ICD-10-CM | POA: Diagnosis not present

## 2016-03-09 DIAGNOSIS — M25562 Pain in left knee: Secondary | ICD-10-CM

## 2016-03-09 DIAGNOSIS — G8929 Other chronic pain: Secondary | ICD-10-CM | POA: Diagnosis not present

## 2016-03-09 DIAGNOSIS — M25561 Pain in right knee: Principal | ICD-10-CM

## 2016-03-09 DIAGNOSIS — R262 Difficulty in walking, not elsewhere classified: Secondary | ICD-10-CM

## 2016-03-09 NOTE — Therapy (Signed)
[facility] [address] [place], [place], [address] Phone: [phone]   Fax:  [phone]  Physical Therapy Treatment  Patient Details  Name: [name] MouldSudhaben M [name] MRN: [id number] Date of Birth: 11/07/1945 Referring Provider: Darrelyn HillockGioffre  Encounter Date: 03/09/2016      PT End of Session - 03/09/16 1044    Visit Number 9   Date for PT Re-Evaluation 04/09/16   PT Start Time 1015   PT Stop Time 1050   PT Time Calculation (min) 35 min   Activity Tolerance Patient limited by pain   Behavior During Therapy [facility] for tasks assessed/performed      Past Medical History:  Diagnosis Date  . Acute bronchiolitis due to other infectious organisms   . Acute bronchiolitis due to respiratory syncytial virus (RSV)   . Arthritis    OA AND PAIN RIGHT KNEE  . Degeneration of intervertebral disc, site unspecified   . Disorder of bone and cartilage, unspecified   . Disturbance of skin sensation   . Encounter for long-term (current) use of other medications   . GERD (gastroesophageal reflux disease)    PT STATES SEVERE REFLUX IF SHE DOES NOT TAKE HER OMEPRAZOLE  . Headache(784.0)    WHEN REFLUX SEVERE  . Hypothyroidism   . Long term (current) use of anticoagulants   . Osteoarthrosis, unspecified whether generalized or localized, lower leg   . Osteoporosis, unspecified   . Other and unspecified hyperlipidemia   . Other atopic dermatitis and related conditions   . Other malaise and fatigue   . Other specified pre-operative examination 11/07/2011  . Thoracic or lumbosacral neuritis or radiculitis, unspecified   . Unspecified vitamin D deficiency   . Vegetarian diet     Past Surgical History:  Procedure Laterality Date  . CERVICAL BIOPSY  W/ LOOP ELECTRODE EXCISION    . TEETH EXTRACTIONS--NO OTHER SURGERY    . TOTAL KNEE ARTHROPLASTY  11/28/2011   Procedure: TOTAL KNEE ARTHROPLASTY;  Surgeon: Jacki Conesonald A Gioffre, MD;  Location: [facility];   Service: Orthopedics;  Laterality: Right;  . TUBAL LIGATION      There were no vitals filed for this visit.      Subjective Assessment - 03/09/16 1008    Subjective Pt reports no change   Currently in Pain? Yes   Pain Score --  3 to 4   Pain Location Knee   Pain Orientation Right                         [facility] Adult PT Treatment/Exercise - 03/09/16 0001      Knee/Hip Exercises: Aerobic   Stationary Bike L0 x7 min     Knee/Hip Exercises: Seated   Long Arc Quad Strengthening;Right;10 reps;1 set   Hamstring Curl Right;2 sets;10 reps   Hamstring Limitations blue Tband      Manual Therapy   Manual Therapy Passive ROM   Passive ROM R knee flexion                  PT Short Term Goals - 02/28/16 1115      PT SHORT TERM GOAL #1   Title independent with initial HEP   Status Achieved           PT Long Term Goals - 03/09/16 1048      PT LONG TERM GOAL #1   Title decrease pain 50%   Status On-going     PT  LONG TERM GOAL #2   Title increase ROM of the right knee to 0-116 degrees flexion   Status Achieved     PT LONG TERM GOAL #3   Title go up and down stairs step over step   Status On-going     PT LONG TERM GOAL #4   Title decrease pain 50%   Status On-going               Plan - 03/09/16 1044    Clinical Impression Statement Pt has been to therapy for nine visits without any changes to her R knee pain. Pt reports no functional limitations only knee pain at the end range of R knee flexion. Therapy has consisted of stretching, exercises, ionto patch, US and taping without any relief. Pt continues to report anterior R knee pain at end range of flexion. Pt has what appears to be a pocket of fluid in the anterior R knee that is tender with palpation at all ranges of motion.    Rehab Potential Good   PT Frequency 2x / week   PT Duration 8 weeks   PT Treatment/Interventions Cryotherapy;Electrical Stimulation;Iontophoresis 4mg /ml  Dexamethasone;Moist Heat;Ultrasound;Gait training;Functional mobility training;Patient/family education;Balance training;Therapeutic exercise;Therapeutic activities;Manual techniques;Taping;Vasopneumatic Device   PT Next Visit Plan Will place pt on a 2 week hold and suggest her to follow up with the MD.      Patient will benefit from skilled therapeutic intervention in order to improve the following deficits and impairments:  Abnormal gait, Decreased mobility, Decreased range of motion, Decreased strength, Difficulty walking, Impaired flexibility, Pain  Visit Diagnosis: Chronic pain of right knee  Difficulty in walking, not elsewhere classified  Stiffness of right knee, not elsewhere classified  Chronic pain of left knee     Problem List Patient Active Problem List   Diagnosis Date Noted  . Thyroid activity decreased 11/19/2014  . Pain, joint, multiple sites 11/19/2014  . Numbness and tingling of left leg 04/06/2014  . Routine general medical examination at a health care facility 09/29/2013  . Osteoporosis, post-menopausal 04/21/2013  . Cough variant asthma 03/17/2013  . Psoriasis 03/17/2013  . Persistent dry cough 03/17/2013  . Osteoarthritis of left knee 03/03/2013  . Psoriasis-like skin disease 03/03/2013  . Eczema 01/07/2013  . Need for prophylactic vaccination and inoculation against influenza 01/07/2013  . Prediabetes 12/17/2012  . Cough 12/17/2012  . Reactive airway disease with wheezing 12/03/2012  . URI, acute 12/03/2012  . Hypothyroidism 10/29/2012  . Unspecified vitamin D deficiency 10/29/2012  . Hyperlipidemia LDL goal <130 10/29/2012  . GERD (gastroesophageal reflux disease) 10/29/2012  . Osteoporosis 10/29/2012  . Myalgia and myositis 10/29/2012    Grayce Sessionsonald G Yari Szeliga, PTA 03/09/2016, 10:50 AM  [facility] [address] [place], [place], [address] Phone: [phone]   Fax:   [phone]  Name: [name] MouldSudhaben M [name] MRN: [id number] Date of Birth: 11/18/1945

## 2016-03-14 ENCOUNTER — Encounter: Payer: Medicare Other | Admitting: Physical Therapy

## 2016-03-15 ENCOUNTER — Ambulatory Visit: Payer: Medicare Other | Admitting: Obstetrics and Gynecology

## 2016-03-16 ENCOUNTER — Encounter: Payer: Medicare Other | Admitting: Physical Therapy

## 2016-04-06 ENCOUNTER — Telehealth: Payer: Self-pay | Admitting: *Deleted

## 2016-04-06 DIAGNOSIS — M25569 Pain in unspecified knee: Secondary | ICD-10-CM

## 2016-04-06 DIAGNOSIS — Z0100 Encounter for examination of eyes and vision without abnormal findings: Secondary | ICD-10-CM

## 2016-04-06 NOTE — Telephone Encounter (Signed)
Referrals placed 

## 2016-04-06 NOTE — Telephone Encounter (Signed)
Patient walked in requesting Referrals For: 1. Dr. Carolee RotaZachary Forsey (Eye Dr.) Has an appointment on Tuesday 04/10/2016. They told her that she would need a referral from [facility].   2. [facility]. Carmelina Pealonald Giofree [facility]-to check her knees.  Please Advise.

## 2016-04-06 NOTE — Telephone Encounter (Signed)
Ok to both referrals

## 2016-04-10 DIAGNOSIS — H2511 Age-related nuclear cataract, right eye: Secondary | ICD-10-CM | POA: Diagnosis not present

## 2016-04-10 DIAGNOSIS — H43811 Vitreous degeneration, right eye: Secondary | ICD-10-CM | POA: Diagnosis not present

## 2016-04-10 DIAGNOSIS — H524 Presbyopia: Secondary | ICD-10-CM | POA: Diagnosis not present

## 2016-04-10 DIAGNOSIS — H25011 Cortical age-related cataract, right eye: Secondary | ICD-10-CM | POA: Diagnosis not present

## 2016-04-10 DIAGNOSIS — H35342 Macular cyst, hole, or pseudohole, left eye: Secondary | ICD-10-CM | POA: Diagnosis not present

## 2016-04-13 DIAGNOSIS — G8929 Other chronic pain: Secondary | ICD-10-CM | POA: Diagnosis not present

## 2016-04-13 DIAGNOSIS — Z96651 Presence of right artificial knee joint: Secondary | ICD-10-CM | POA: Diagnosis not present

## 2016-04-13 DIAGNOSIS — Z471 Aftercare following joint replacement surgery: Secondary | ICD-10-CM | POA: Diagnosis not present

## 2016-04-13 DIAGNOSIS — M25562 Pain in left knee: Secondary | ICD-10-CM | POA: Diagnosis not present

## 2016-05-02 ENCOUNTER — Other Ambulatory Visit: Payer: Self-pay

## 2016-05-02 DIAGNOSIS — E785 Hyperlipidemia, unspecified: Secondary | ICD-10-CM

## 2016-05-02 DIAGNOSIS — R7303 Prediabetes: Secondary | ICD-10-CM

## 2016-05-03 ENCOUNTER — Other Ambulatory Visit: Payer: Medicare Other

## 2016-05-09 ENCOUNTER — Other Ambulatory Visit: Payer: Self-pay | Admitting: Internal Medicine

## 2016-05-09 ENCOUNTER — Other Ambulatory Visit: Payer: Medicare Other

## 2016-05-09 ENCOUNTER — Ambulatory Visit (INDEPENDENT_AMBULATORY_CARE_PROVIDER_SITE_OTHER): Payer: Medicare Other

## 2016-05-09 VITALS — BP 140/92 | HR 77 | Temp 97.5°F | Ht <= 58 in | Wt 132.4 lb

## 2016-05-09 DIAGNOSIS — R7303 Prediabetes: Secondary | ICD-10-CM | POA: Diagnosis not present

## 2016-05-09 DIAGNOSIS — E785 Hyperlipidemia, unspecified: Secondary | ICD-10-CM

## 2016-05-09 DIAGNOSIS — E039 Hypothyroidism, unspecified: Secondary | ICD-10-CM | POA: Diagnosis not present

## 2016-05-09 DIAGNOSIS — Z0000 Encounter for general adult medical examination without abnormal findings: Secondary | ICD-10-CM | POA: Diagnosis not present

## 2016-05-09 LAB — LIPID PANEL
CHOLESTEROL: 163 mg/dL (ref <200)
HDL: 55 mg/dL (ref >50)
LDL CALC: 81 mg/dL (ref <100)
TRIGLYCERIDES: 137 mg/dL (ref <150)
Total CHOL/HDL Ratio: 3 Ratio (ref <5.0)
VLDL: 27 mg/dL (ref <30)

## 2016-05-09 LAB — COMPLETE METABOLIC PANEL WITH GFR
ALT: 25 U/L (ref 6–29)
AST: 24 U/L (ref 10–35)
Albumin: 4 g/dL (ref 3.6–5.1)
Alkaline Phosphatase: 63 U/L (ref 33–130)
BILIRUBIN TOTAL: 0.6 mg/dL (ref 0.2–1.2)
BUN: 12 mg/dL (ref 7–25)
CALCIUM: 8.9 mg/dL (ref 8.6–10.4)
CHLORIDE: 104 mmol/L (ref 98–110)
CO2: 24 mmol/L (ref 20–31)
CREATININE: 0.52 mg/dL — AB (ref 0.60–0.93)
Glucose, Bld: 105 mg/dL — ABNORMAL HIGH (ref 65–99)
Potassium: 4.5 mmol/L (ref 3.5–5.3)
Sodium: 136 mmol/L (ref 135–146)
TOTAL PROTEIN: 6.5 g/dL (ref 6.1–8.1)

## 2016-05-09 NOTE — Progress Notes (Signed)
Subjective:   [name] [name] is a 71 y.o. female who presents for an Initial Medicare Annual Wellness Visit.  Review of Systems     Cardiac Risk Factors include: advanced age (>34men, >48 women);sedentary lifestyle     Objective:    Today's Vitals   05/09/16 0926  BP: (!) 140/92  Pulse: 77  Temp: 97.5 F (36.4 C)  TempSrc: Oral  SpO2: 99%  Weight: 132 lb 6.4 oz (60.1 kg)  Height: 4' 9.5" (1.461 m)  PainSc: 0-No pain   Body mass index is 28.15 kg/m.   Current Medications (verified) Outpatient Encounter Prescriptions as of 05/09/2016  Medication Sig  . aspirin 81 MG tablet Take 81 mg by mouth daily.  . calcium carbonate (OS-CAL) 600 MG TABS Take 600 mg by mouth 2 (two) times daily with a meal. Take one tablet twice a day for calcium supplement  . denosumab (PROLIA) 60 MG/ML SOLN injection Inject 60 mg into the skin once. Administer in upper arm, thigh, or abdomen  . ferrous sulfate 325 (65 FE) MG tablet Take 1 tablet (325 mg total) by mouth daily with breakfast.  . levothyroxine (SYNTHROID, LEVOTHROID) 25 MCG tablet Take 1 tablet (25 mcg total) by mouth daily before breakfast.  . meloxicam (MOBIC) 15 MG tablet Take one tablet once daily for leg pain  . misoprostol (CYTOTEC) 200 MCG tablet Place 2 tablets vaginally 6-12 hours prior to your appointment  . omeprazole (PRILOSEC) 20 MG capsule Take one capsule by mouth once daily to reduce stomach acid  . simvastatin (ZOCOR) 20 MG tablet Take one tablet by mouth once daily for cholesterol  . conjugated estrogens (PREMARIN) vaginal cream 1/2 gram vaginally every night for 2 weeks prior to your follow up appointment in March  . [DISCONTINUED] betamethasone dipropionate (DIPROLENE) 0.05 % cream Apply topically daily.   No facility-administered encounter medications on file as of 05/09/2016.     Allergies (verified) Patient has no known allergies.   History: Past Medical History:  Diagnosis Date  . Acute bronchiolitis due to  other infectious organisms   . Acute bronchiolitis due to respiratory syncytial virus (RSV)   . Arthritis    OA AND PAIN RIGHT KNEE  . Degeneration of intervertebral disc, site unspecified   . Disorder of bone and cartilage, unspecified   . Disturbance of skin sensation   . Encounter for long-term (current) use of other medications   . GERD (gastroesophageal reflux disease)    PT STATES SEVERE REFLUX IF SHE DOES NOT TAKE HER OMEPRAZOLE  . Headache(784.0)    WHEN REFLUX SEVERE  . Hypothyroidism   . Long term (current) use of anticoagulants   . Osteoarthrosis, unspecified whether generalized or localized, lower leg   . Osteoporosis, unspecified   . Other and unspecified hyperlipidemia   . Other atopic dermatitis and related conditions   . Other malaise and fatigue   . Other specified pre-operative examination 11/07/2011  . Thoracic or lumbosacral neuritis or radiculitis, unspecified   . Unspecified vitamin D deficiency   . Vegetarian diet    Past Surgical History:  Procedure Laterality Date  . CERVICAL BIOPSY  W/ LOOP ELECTRODE EXCISION    . EYE SURGERY  2016   Left eye, Cataract Removal  . TEETH EXTRACTIONS--NO OTHER SURGERY    . TOTAL KNEE ARTHROPLASTY  11/28/2011   Procedure: TOTAL KNEE ARTHROPLASTY;  Surgeon: Jacki Cones, MD;  Location: [facility];  Service: Orthopedics;  Laterality: Right;  . TUBAL LIGATION  Family History  Problem Relation Age of Onset  . Alport syndrome Brother    Social History   Occupational History  . Not on file.   Social History Main Topics  . Smoking status: Never Smoker  . Smokeless tobacco: Never Used  . Alcohol use No  . Drug use: No  . Sexual activity: Yes    Birth control/ protection: Post-menopausal    Tobacco Counseling Counseling given: No   Activities of Daily Living In your present state of health, do you have any difficulty performing the following activities: 05/09/2016  Hearing? Y  Vision? N  Difficulty  concentrating or making decisions? N  Walking or climbing stairs? N  Dressing or bathing? N  Doing errands, shopping? Y  Preparing Food and eating ? N  Using the Toilet? N  In the past six months, have you accidently leaked urine? N  Do you have problems with loss of bowel control? N  Managing your Medications? N  Managing your Finances? N  Housekeeping or managing your Housekeeping? N  Some recent data might be hidden    Immunizations and Health Maintenance Immunization History  Administered Date(s) Administered  . Influenza,inj,Quad PF,36+ Mos 01/07/2013, 02/09/2014, 01/19/2015, 02/24/2016  . Influenza-Unspecified 02/20/2012  . Pneumococcal Conjugate-13 09/29/2013  . Pneumococcal Polysaccharide-23 06/28/2010  . Tdap 03/28/2011   Health Maintenance Due  Topic Date Due  . COLONOSCOPY  07/05/1993  . ZOSTAVAX  07/06/2003    Patient Care Team: Kirt Boys, DO as PCP - General (Internal Medicine)  Indicate any recent Medical Services you may have received from other than [facility] providers in the past year (date may be approximate).     Assessment:   This is a routine wellness examination for [name] [name].   Hearing/Vision screen  Hearing Screening   125Hz  250Hz  500Hz  1000Hz  2000Hz  3000Hz  4000Hz  6000Hz  8000Hz   Right ear:   100 0 100  0    Left ear:   0 0 100  0    Comments: Pt has not had a hearing screen. Failed office hearing screen, does not want to pursue seeing an Audiologist at this time.  Vision Screening Comments: Per pt. Last eye exam was done in Nov. 2017 with Dr. Ronney Asters in [place].   Dietary issues and exercise activities discussed: Current Exercise Habits: The patient does not participate in regular exercise at present, Exercise limited by: None identified  Goals    . <enter goal here>          Starting 05/09/2016, I will maintain my current lifestyle.       Depression Screen PHQ 2/9 Scores 05/09/2016 11/02/2015 04/06/2014 01/07/2013  PHQ - 2 Score 0 0 0 0      Fall Risk Fall Risk  05/09/2016 11/02/2015 01/19/2015 11/19/2014 04/06/2014  Falls in the past year? No No No No No    Cognitive Function: MMSE - Mini Mental State Exam 05/09/2016 11/19/2014  Orientation to time 5 4  Orientation to Place 5 5  Registration 3 3  Attention/ Calculation 5 5  Recall 3 3  Language- name 2 objects 2 2  Language- repeat 1 1  Language- follow 3 step command 3 3  Language- read & follow direction 1 1  Write a sentence 1 1  Copy design 1 1  Total score 30 29        Screening Tests Health Maintenance  Topic Date Due  . COLONOSCOPY  07/05/1993  . ZOSTAVAX  07/06/2003  . Hepatitis C Screening  05/07/2018 (  Originally 01/04/1946)  . MAMMOGRAM  12/11/2016  . TETANUS/TDAP  03/27/2021  . INFLUENZA VACCINE  Completed  . DEXA SCAN  Completed  . PNA vac Low Risk Adult  Completed      Plan:    I have personally reviewed and addressed the Medicare Annual Wellness questionnaire and have noted the following in the patient's chart:  A. Medical and social history B. Use of alcohol, tobacco or illicit drugs  C. Current medications and supplements D. Functional ability and status E.  Nutritional status F.  Physical activity G. Advance directives H. List of other physicians I.  Hospitalizations, surgeries, and ER visits in previous 12 months J.  Vitals K. Screenings to include hearing, vision, cognitive, depression L. Referrals and appointments - none  In addition, I have reviewed and discussed with patient certain preventive protocols, quality metrics, and best practice recommendations. A written personalized care plan for preventive services as well as general preventive health recommendations were provided to patient.  See attached scanned questionnaire for additional information.   Signed,   Nilda CalamityAlisa McCutcheon, LPN Health Advisor   [place] [facility]. Ancil Linseyarter, D. O., F. A. C. O. I.  [facility] [address] [place], [place]  [address] [phone] Cell (Monday-Friday 8 AM - 5 PM) [phone] After 5 PM and follow prompts

## 2016-05-09 NOTE — Progress Notes (Signed)
Quick Notes   Health Maintenance:  Pt will get colonoscopy this year. Will wait on the Shingles vaccine.     Abnormal Screen:  None; MMSE-30/30 Passed Clock test   Patient Concerns:  Pt states for the past 6 mos, every night when she is laying down, her left thigh will get tremors. She has to turn on her side to get the tremors to stop, but they will start back if she straightens her leg out. Only on the front part of left thigh. Pt also needs to change the brand of Synthroid, [employer] is having an issue with her current brand.     Nurse Concerns:  None

## 2016-05-09 NOTE — Patient Instructions (Addendum)
[name] [name] , Thank you for taking time to come for your Medicare Wellness Visit. I appreciate your ongoing commitment to your health goals. Please review the following plan we discussed and let me know if I can assist you in the future.   These are the goals we discussed: Goals    . <enter goal here>          Starting 05/09/2016, I will maintain my current lifestyle.        This is a list of the screening recommended for you and due dates:  Health Maintenance  Topic Date Due  . Colon Cancer Screening  05/07/2017*  .  Hepatitis C: One time screening is recommended by Center for Disease Control  (CDC) for  adults born from [birth year] through 1965.   05/07/2018*  . Shingles Vaccine  05/07/2021*  . Mammogram  12/11/2016  . Tetanus Vaccine  03/27/2021  . Flu Shot  Completed  . DEXA scan (bone density measurement)  Completed  . Pneumonia vaccines  Completed  *Topic was postponed. The date shown is not the original due date.  Preventive Care for Adults  A healthy lifestyle and preventive care can promote health and wellness. Preventive health guidelines for adults include the following key practices.  . A routine yearly physical is a good way to check with your health care provider about your health and preventive screening. It is a chance to share any concerns and updates on your health and to receive a thorough exam.  . Visit your dentist for a routine exam and preventive care every 6 months. Brush your teeth twice a day and floss once a day. Good oral hygiene prevents tooth decay and gum disease.  . The frequency of eye exams is based on your age, health, family medical history, use  of contact lenses, and other factors. Follow your health care provider's ecommendations for frequency of eye exams.  . Eat a healthy diet. Foods like vegetables, fruits, whole grains, low-fat dairy products, and lean protein foods contain the nutrients you need without too many calories. Decrease your intake of  foods high in solid fats, added sugars, and salt. Eat the right amount of calories for you. Get information about a proper diet from your health care provider, if necessary.  . Regular physical exercise is one of the most important things you can do for your health. Most adults should get at least 150 minutes of moderate-intensity exercise (any activity that increases your heart rate and causes you to sweat) each week. In addition, most adults need muscle-strengthening exercises on 2 or more days a week.  Silver Sneakers may be a benefit available to you. To determine eligibility, you may visit the website: www.silversneakers.com or contact program at [phone] Mon-Fri between 8AM-8PM.   . Maintain a healthy weight. The body mass index (BMI) is a screening tool to identify possible weight problems. It provides an estimate of body fat based on height and weight. Your health care provider can find your BMI and can help you achieve or maintain a healthy weight.   For adults 20 years and older: ? A BMI below 18.5 is considered underweight. ? A BMI of 18.5 to 24.9 is normal. ? A BMI of 25 to 29.9 is considered overweight. ? A BMI of 30 and above is considered obese.   . Maintain normal blood lipids and cholesterol levels by exercising and minimizing your intake of saturated fat. Eat a balanced diet with plenty of fruit and vegetables.  Blood tests for lipids and cholesterol should begin at age 33 and be repeated every 5 years. If your lipid or cholesterol levels are high, you are over 50, or you are at high risk for heart disease, you may need your cholesterol levels checked more frequently. Ongoing high lipid and cholesterol levels should be treated with medicines if diet and exercise are not working.  . If you smoke, find out from your health care provider how to quit. If you do not use tobacco, please do not start.  . If you choose to drink alcohol, please do not consume more than 2 drinks per  day. One drink is considered to be 12 ounces (355 mL) of beer, 5 ounces (148 mL) of wine, or 1.5 ounces (44 mL) of liquor.  . If you are 66-73 years old, ask your health care provider if you should take aspirin to prevent strokes.  . Use sunscreen. Apply sunscreen liberally and repeatedly throughout the day. You should seek shade when your shadow is shorter than you. Protect yourself by wearing long sleeves, pants, a wide-brimmed hat, and sunglasses year round, whenever you are outdoors.  . Once a month, do a whole body skin exam, using a mirror to look at the skin on your back. Tell your health care provider of new moles, moles that have irregular borders, moles that are larger than a pencil eraser, or moles that have changed in shape or color.

## 2016-05-11 ENCOUNTER — Ambulatory Visit (INDEPENDENT_AMBULATORY_CARE_PROVIDER_SITE_OTHER): Payer: Medicare Other | Admitting: Internal Medicine

## 2016-05-11 ENCOUNTER — Encounter: Payer: Self-pay | Admitting: Internal Medicine

## 2016-05-11 VITALS — BP 132/70 | HR 78 | Temp 98.0°F | Ht <= 58 in | Wt 132.8 lb

## 2016-05-11 DIAGNOSIS — M25462 Effusion, left knee: Secondary | ICD-10-CM | POA: Diagnosis not present

## 2016-05-11 DIAGNOSIS — E034 Atrophy of thyroid (acquired): Secondary | ICD-10-CM | POA: Diagnosis not present

## 2016-05-11 DIAGNOSIS — M810 Age-related osteoporosis without current pathological fracture: Secondary | ICD-10-CM

## 2016-05-11 DIAGNOSIS — R7303 Prediabetes: Secondary | ICD-10-CM | POA: Diagnosis not present

## 2016-05-11 DIAGNOSIS — Z79899 Other long term (current) drug therapy: Secondary | ICD-10-CM

## 2016-05-11 DIAGNOSIS — N870 Mild cervical dysplasia: Secondary | ICD-10-CM | POA: Diagnosis not present

## 2016-05-11 DIAGNOSIS — K219 Gastro-esophageal reflux disease without esophagitis: Secondary | ICD-10-CM

## 2016-05-11 DIAGNOSIS — M62838 Other muscle spasm: Secondary | ICD-10-CM

## 2016-05-11 DIAGNOSIS — E785 Hyperlipidemia, unspecified: Secondary | ICD-10-CM | POA: Diagnosis not present

## 2016-05-11 LAB — TSH: TSH: 5.11 m[IU]/L — AB

## 2016-05-11 MED ORDER — MELOXICAM 15 MG PO TABS: ORAL_TABLET | ORAL | 3 refills | Status: DC

## 2016-05-11 MED ORDER — METHOCARBAMOL 500 MG PO TABS: 500.0000 mg | ORAL_TABLET | Freq: Three times a day (TID) | ORAL | 3 refills | Status: DC | PRN

## 2016-05-11 MED ORDER — LEVOTHYROXINE SODIUM 25 MCG PO TABS: 25.0000 ug | ORAL_TABLET | Freq: Every day | ORAL | 3 refills | Status: DC

## 2016-05-11 NOTE — Patient Instructions (Addendum)
Take meloxicam daily for joint swelling  May take muscle relaxer as needed for spasms  Will call with thyroid results  Continue other medications as ordered  Will call with Ortho referral  Follow up in 6 mos for routine visit. Fasting labs prior to appt

## 2016-05-11 NOTE — Progress Notes (Signed)
Patient ID: [name] [name], female   DOB: Sep 15, 1945, 71 y.o.   MRN: [id number]    Location:  [facility] Place of Service: OFFICE  Chief Complaint  Patient presents with  . Medical Management of Chronic Issues    6 months routine visit, here with interpreter   . Medication Management    patient states levothyroxine needs to be changed not able to get at the pharmacy    HPI:  71 yo female seen today for f/u. Spouse and translator present today. Pt c/o numbness in hands this AM. No known trauma. She continues to have muscle spasms in left thigh. She saw Ortho Dr Lawernce Pitts and was dx with left knee OA. She had PT without relief. She was Rx zanaflex but insurance will not cover it.   GERD - controlled on omeprazole. She does have bloating  Cervical CIN I - s/p LEEP in 2017 due to unsatisfactory colposcopy for abnormal pap with LGSIL with dysplasia with cells s/o high grade. Followed by GYN  Hypothyroidism -  stable on levothyroxine. No CP, palpitations, fatigue, SOB, change in bowel habits  Hyperlipidemia - stable on statin tx. No myalgias. LDL 81. She takes ASA daily  Joint swelling with hx right knee OA s/p TKR - she has muscle spasms of left thigh and has seen Ortho. She reports no injections given but was sent for PT which did not help. She would like a 2nd opinion. She has not taken meloxicam in several mos.  She has gotten prolia injection q30mo for osteoporosis. Insurance denied coverage and she has been paying out-of-pocket   Past Medical History:  Diagnosis Date  . Acute bronchiolitis due to other infectious organisms   . Acute bronchiolitis due to respiratory syncytial virus (RSV)   . Arthritis    OA AND PAIN RIGHT KNEE  . Degeneration of intervertebral disc, site unspecified   . Disorder of bone and cartilage, unspecified   . Disturbance of skin sensation   . Encounter for long-term (current) use of other medications   . GERD (gastroesophageal reflux disease)    PT STATES SEVERE  REFLUX IF SHE DOES NOT TAKE HER OMEPRAZOLE  . Headache(784.0)    WHEN REFLUX SEVERE  . Hypothyroidism   . Long term (current) use of anticoagulants   . Osteoarthrosis, unspecified whether generalized or localized, lower leg   . Osteoporosis, unspecified   . Other and unspecified hyperlipidemia   . Other atopic dermatitis and related conditions   . Other malaise and fatigue   . Other specified pre-operative examination 11/07/2011  . Thoracic or lumbosacral neuritis or radiculitis, unspecified   . Unspecified vitamin D deficiency   . Vegetarian diet     Past Surgical History:  Procedure Laterality Date  . CERVICAL BIOPSY  W/ LOOP ELECTRODE EXCISION    . EYE SURGERY  2016   Left eye, Cataract Removal  . TEETH EXTRACTIONS--NO OTHER SURGERY    . TOTAL KNEE ARTHROPLASTY  11/28/2011   Procedure: TOTAL KNEE ARTHROPLASTY;  Surgeon: RTobi Bastos MD;  Location: [facility];  Service: Orthopedics;  Laterality: Right;  . TUBAL LIGATION      Patient Care Team: MGildardo Cranker DO as PCP - General (Internal Medicine)  Social History   Social History  . Marital status: Married    Spouse name: N/A  . Number of children: N/A  . Years of education: N/A   Occupational History  . Not on file.   Social History Main Topics  . Smoking status:  Never Smoker  . Smokeless tobacco: Never Used  . Alcohol use No  . Drug use: No  . Sexual activity: Yes    Birth control/ protection: Post-menopausal   Other Topics Concern  . Not on file   Social History Narrative  . No narrative on file     reports that she has never smoked. She has never used smokeless tobacco. She reports that she does not drink alcohol or use drugs.  Family History  Problem Relation Age of Onset  . Alport syndrome Brother    Family Status  Relation Status  . Mother Deceased  . Father Deceased  . Sister Alive  . Brother Alive  . Daughter Alive  . Son Alive  . Brother Alive  . Sister Alive  . Brother Alive  .  Daughter Alive     No Known Allergies  Medications: Patient's Medications  New Prescriptions   No medications on file  Previous Medications   ASPIRIN 81 MG TABLET    Take 81 mg by mouth daily.   CALCIUM CARBONATE (OS-CAL) 600 MG TABS    Take 600 mg by mouth 2 (two) times daily with a meal. Take one tablet twice a day for calcium supplement   CONJUGATED ESTROGENS (PREMARIN) VAGINAL CREAM    1/2 gram vaginally every night for 2 weeks prior to your follow up appointment in March   DENOSUMAB (PROLIA) 60 MG/ML SOLN INJECTION    Inject 60 mg into the skin once. Administer in upper arm, thigh, or abdomen   FERROUS SULFATE 325 (65 FE) MG TABLET    Take 1 tablet (325 mg total) by mouth daily with breakfast.   LEVOTHYROXINE (SYNTHROID, LEVOTHROID) 25 MCG TABLET    Take 1 tablet (25 mcg total) by mouth daily before breakfast.   MELOXICAM (MOBIC) 15 MG TABLET    Take one tablet once daily for leg pain   MISOPROSTOL (CYTOTEC) 200 MCG TABLET    Place 2 tablets vaginally 6-12 hours prior to your appointment   OMEPRAZOLE (PRILOSEC) 20 MG CAPSULE    Take one capsule by mouth once daily to reduce stomach acid   SIMVASTATIN (ZOCOR) 20 MG TABLET    Take one tablet by mouth once daily for cholesterol  Modified Medications   No medications on file  Discontinued Medications   No medications on file    Review of Systems  Gastrointestinal:       Increased abdominal girth  Genitourinary: Negative for pelvic pain.  Musculoskeletal: Positive for arthralgias and joint swelling.  All other systems reviewed and are negative.   Vitals:   05/11/16 0912  BP: 132/70  Pulse: 78  Temp: 98 F (36.7 C)  TempSrc: Oral  SpO2: 98%  Weight: 132 lb 12.8 oz (60.2 kg)  Height: 4' 9"  (1.448 m)   Body mass index is 28.74 kg/m.  Physical Exam  Constitutional: She is oriented to person, place, and time. She appears well-developed and well-nourished.  HENT:  Mouth/Throat: Oropharynx is clear and moist. No  oropharyngeal exudate.  Eyes: Pupils are equal, round, and reactive to light.  Neck: Neck supple. No tracheal deviation present.  Cardiovascular: Normal rate, regular rhythm and intact distal pulses.  Exam reveals no gallop and no friction rub.   Murmur (1/6 SEM) heard. Pulmonary/Chest: Effort normal and breath sounds normal. No stridor. No respiratory distress. She has no wheezes. She has no rales. She exhibits no tenderness.  Abdominal: Soft. Bowel sounds are normal. She exhibits distension. She exhibits no  mass. There is no hepatomegaly. There is no tenderness. There is no rebound and no guarding.  Musculoskeletal: Normal range of motion. She exhibits edema and deformity (thumb b/l). She exhibits no tenderness.  Neg Tinel's b/l; L>R medial epicondyl swelling with intact ROM at elbow and no TTP; b/l base of thumb swelling and joint deformity with reduced ROM; no TTP; left knee swelling with reduced ROM and crepitus on ROM but no pain; gait antalgic; left iliotibial band hypertrophy but no TTP  Lymphadenopathy:    She has no cervical adenopathy.  Neurological: She is alert and oriented to person, place, and time.  Skin: Skin is warm and dry. No rash noted.  Psychiatric: She has a normal mood and affect. Her behavior is normal. Thought content normal.     Labs reviewed: Appointment on 05/09/2016  Component Date Value Ref Range Status  . Sodium 05/09/2016 136  135 - 146 mmol/L Final  . Potassium 05/09/2016 4.5  3.5 - 5.3 mmol/L Final  . Chloride 05/09/2016 104  98 - 110 mmol/L Final  . CO2 05/09/2016 24  20 - 31 mmol/L Final  . Glucose, Bld 05/09/2016 105* 65 - 99 mg/dL Final  . BUN 05/09/2016 12  7 - 25 mg/dL Final  . Creat 05/09/2016 0.52* 0.60 - 0.93 mg/dL Final   Comment:   For patients > or = 73 years of age: The upper reference limit for Creatinine is approximately 13% higher for people identified as African-American.     . Total Bilirubin 05/09/2016 0.6  0.2 - 1.2 mg/dL Final    . Alkaline Phosphatase 05/09/2016 63  33 - 130 U/L Final  . AST 05/09/2016 24  10 - 35 U/L Final  . ALT 05/09/2016 25  6 - 29 U/L Final  . Total Protein 05/09/2016 6.5  6.1 - 8.1 g/dL Final  . Albumin 05/09/2016 4.0  3.6 - 5.1 g/dL Final  . Calcium 05/09/2016 8.9  8.6 - 10.4 mg/dL Final  . GFR, Est African American 05/09/2016 >89  >=60 mL/min Final  . GFR, Est Non African American 05/09/2016 >89  >=60 mL/min Final  . Cholesterol 05/09/2016 163  <200 mg/dL Final  . Triglycerides 05/09/2016 137  <150 mg/dL Final  . HDL 05/09/2016 55  >50 mg/dL Final  . Total CHOL/HDL Ratio 05/09/2016 3.0  <5.0 Ratio Final  . VLDL 05/09/2016 27  <30 mg/dL Final  . LDL Cholesterol 05/09/2016 81  <100 mg/dL Final    No results found.   Assessment/Plan   ICD-9-CM ICD-10-CM   1. Muscle spasm of left lower extremity 728.85 M62.838 Ambulatory referral to Orthopedic Surgery     methocarbamol (ROBAXIN) 500 MG tablet  2. Swelling of joint of left knee 719.06 M25.462 Ambulatory referral to Orthopedic Surgery     meloxicam (MOBIC) 15 MG tablet  3. Hypothyroidism due to acquired atrophy of thyroid 244.8 E03.4 levothyroxine (SYNTHROID, LEVOTHROID) 25 MCG tablet   246.8  CBC with Differential/Platelets     TSH     T4, Free  4. Hyperlipidemia LDL goal <130 272.4 E78.5 Lipid Panel  5. Gastroesophageal reflux disease, esophagitis presence not specified 530.81 K21.9   6. Dysplasia of cervix, low grade (CIN 1) 622.11 N87.0   7. Age-related osteoporosis without current pathological fracture 733.01 M81.0   8. Prediabetes 790.29 R73.03 CBC with Differential/Platelets     Hemoglobin A1c  9. High risk medication use V58.69 Z79.899 BMP with eGFR     ALT   Take meloxicam daily for joint swelling  May take muscle relaxer as needed for spasms  Add TSH to labs drawn earlier this week and will call with results  Continue other medications as ordered  Will call with Ortho referral  Follow up in 6 mos for routine  visit. Fasting labs prior to appt  [place] S. Perlie Gold  [facility] [address] [place], [place] [address] [phone] Cell (Monday-Friday 8 AM - 5 PM) [phone] After 5 PM and follow prompts

## 2016-05-14 ENCOUNTER — Other Ambulatory Visit: Payer: Self-pay

## 2016-05-14 DIAGNOSIS — E034 Atrophy of thyroid (acquired): Secondary | ICD-10-CM

## 2016-05-16 ENCOUNTER — Telehealth: Payer: Self-pay

## 2016-05-16 NOTE — Telephone Encounter (Signed)
May with [facility] called to inform Dr.Carter that mobic or zanaflex is an alternative to robaxin   Please advise

## 2016-05-16 NOTE — Telephone Encounter (Signed)
Do we know which muscle relaxers will insurance cover?

## 2016-05-16 NOTE — Telephone Encounter (Signed)
Message received via fax from [facility] indicating Robaxin is not covered by [employer] insurance, patient needs an alternative  Please advise

## 2016-05-16 NOTE — Telephone Encounter (Signed)
Left message on voicemail @ [facility] requesting a returned call to inform provider of what is covered by insurance

## 2016-05-17 NOTE — Telephone Encounter (Signed)
She was already Rx zanaflex but did not pick it up as she was told her insurance would not cover it. Has that changed?   Mobic is not a muscle relaxer

## 2016-05-21 DIAGNOSIS — M25562 Pain in left knee: Secondary | ICD-10-CM | POA: Diagnosis not present

## 2016-05-21 DIAGNOSIS — M545 Low back pain: Secondary | ICD-10-CM | POA: Diagnosis not present

## 2016-05-21 DIAGNOSIS — M25561 Pain in right knee: Secondary | ICD-10-CM | POA: Diagnosis not present

## 2016-05-22 ENCOUNTER — Encounter: Payer: Self-pay | Admitting: Physical Therapy

## 2016-05-22 ENCOUNTER — Ambulatory Visit: Payer: Medicare Other | Attending: Orthopedic Surgery | Admitting: Physical Therapy

## 2016-05-22 DIAGNOSIS — M7632 Iliotibial band syndrome, left leg: Secondary | ICD-10-CM

## 2016-05-22 DIAGNOSIS — M79605 Pain in left leg: Secondary | ICD-10-CM | POA: Diagnosis not present

## 2016-05-22 DIAGNOSIS — R262 Difficulty in walking, not elsewhere classified: Secondary | ICD-10-CM | POA: Diagnosis not present

## 2016-05-22 NOTE — Telephone Encounter (Signed)
Please contact pharmacy regarding coverage of zanaflex. Thanks

## 2016-05-22 NOTE — Therapy (Signed)
[facility] [address] [place], [place], [address] Phone: [phone]   Fax:  [phone]  Physical Therapy Evaluation  Patient Details  Name: [name] [name] MRN: [id number] Date of Birth: 1945/06/25 Referring Provider: Mckinley Jewel  Encounter Date: 05/22/2016      PT End of Session - 05/22/16 0826    Visit Number 1   Date for PT Re-Evaluation 07/20/16   PT Start Time 0759   PT Stop Time 0845   PT Time Calculation (min) 46 min   Activity Tolerance Patient tolerated treatment well   Behavior During Therapy [facility] for tasks assessed/performed      Past Medical History:  Diagnosis Date  . Acute bronchiolitis due to other infectious organisms   . Acute bronchiolitis due to respiratory syncytial virus (RSV)   . Arthritis    OA AND PAIN RIGHT KNEE  . Degeneration of intervertebral disc, site unspecified   . Disorder of bone and cartilage, unspecified   . Disturbance of skin sensation   . Encounter for long-term (current) use of other medications   . GERD (gastroesophageal reflux disease)    PT STATES SEVERE REFLUX IF SHE DOES NOT TAKE HER OMEPRAZOLE  . Headache(784.0)    WHEN REFLUX SEVERE  . Hypothyroidism   . Long term (current) use of anticoagulants   . Osteoarthrosis, unspecified whether generalized or localized, lower leg   . Osteoporosis, unspecified   . Other and unspecified hyperlipidemia   . Other atopic dermatitis and related conditions   . Other malaise and fatigue   . Other specified pre-operative examination 11/07/2011  . Thoracic or lumbosacral neuritis or radiculitis, unspecified   . Unspecified vitamin D deficiency   . Vegetarian diet     Past Surgical History:  Procedure Laterality Date  . CERVICAL BIOPSY  W/ LOOP ELECTRODE EXCISION    . EYE SURGERY  2016   Left eye, Cataract Removal  . TEETH EXTRACTIONS--NO OTHER SURGERY    . TOTAL KNEE ARTHROPLASTY  11/28/2011   Procedure: TOTAL KNEE  ARTHROPLASTY;  Surgeon: Jacki Cones, MD;  Location: [facility];  Service: Orthopedics;  Laterality: Right;  . TUBAL LIGATION      There were no vitals filed for this visit.       Subjective Assessment - 05/22/16 0800    Subjective Patient was seen here about 1-2 months ago for chronic left knee pain.  She did not get any better.  She saw the orthopod yesterday and he referred her back to [place] for ITB syndrome.  She reports no changes with our treatment, c/o pain on the lateral left thigh   Limitations Standing;Walking;House hold activities   Patient Stated Goals less pain, better motions   Currently in Pain? Yes   Pain Score 1    Pain Location Knee   Pain Orientation Left   Pain Descriptors / Indicators Sore;Tightness   Pain Type Chronic pain   Pain Onset More than a month ago   Pain Frequency Constant   Aggravating Factors  night time is worse up to 8/10   Pain Relieving Factors difficult to sleep, uncomfortable            [facility] PT Assessment - 05/22/16 0001      Assessment   Medical Diagnosis left ITB syndrome   Referring Provider Mckinley Jewel   Onset Date/Surgical Date 05/21/16   Prior Therapy yes for the right knee     Precautions   Precautions None  Balance Screen   Has the patient fallen in the past 6 months No   Has the patient had a decrease in activity level because of a fear of falling?  No   Is the patient reluctant to leave their home because of a fear of falling?  No     Home Environment   Additional Comments has stairs, does housework     Prior Function   Level of Independence Independent   Vocation Retired   Leisure some walking in the past     AROM   Overall AROM Comments AROM of the left knee 0-108 degrees flexion, left hip ROM actively WFL's but c/o tightness and some soreness     Strength   Overall Strength Comments left knee 4/5, left hip 3+/5 with pain     Flexibility   Soft Tissue Assessment /Muscle Length --  very tight and sore  with ITB stretch      Palpation   Palpation comment very tender to ITB      Ambulation/Gait   Gait Comments mild left antalgic                   [facility] Adult PT Treatment/Exercise - 05/22/16 0001      Moist Heat Therapy   Number Minutes Moist Heat 15 Minutes   Moist Heat Location Hip     Electrical Stimulation   Electrical Stimulation Location left ITB   Electrical Stimulation Action IFC   Electrical Stimulation Parameters right sidelying   Electrical Stimulation Goals Pain                PT Education - 05/22/16 0826    Education provided Yes   Education Details ITB stretch   Person(s) Educated Patient;Spouse   Methods Explanation;Demonstration;Handout   Comprehension Verbalized understanding          PT Short Term Goals - 05/22/16 0829      PT SHORT TERM GOAL #1   Title independent with initial HEP   Time 2   Period Weeks   Status New           PT Long Term Goals - 05/22/16 1610      PT LONG TERM GOAL #1   Title decrease pain 50%   Time 8   Period Weeks   Status New     PT LONG TERM GOAL #2   Title increase ROM of the left knee to 0-116 degrees flexion   Time 8   Period Weeks   Status New     PT LONG TERM GOAL #3   Title go up and down stairs step over step   Time 8   Period Weeks   Status New     PT LONG TERM GOAL #4   Title report sleep 50% better   Time 8   Period Weeks   Status New               Plan - 05/22/16 0827    Clinical Impression Statement Patient was seen here in the fall of 2017 for right knee pain.  She returns to [place] today with diagnosis of left ITB syndrome.  She is very tight with the ITB, she is tender along the ITB, very tender proximal and distally.     Rehab Potential Good   PT Frequency 2x / week   PT Duration 8 weeks   PT Treatment/Interventions Cryotherapy;Electrical Stimulation;Iontophoresis 4mg /ml Dexamethasone;Moist Heat;Ultrasound;Gait training;Functional mobility  training;Patient/family education;Balance training;Therapeutic exercise;Therapeutic activities;Manual techniques;Taping;Vasopneumatic  Device   PT Next Visit Plan Try STM, rolling of the ITB, assure her independence with stretches   Consulted and Agree with Plan of Care Patient      Patient will benefit from skilled therapeutic intervention in order to improve the following deficits and impairments:  Abnormal gait, Decreased mobility, Decreased range of motion, Decreased strength, Difficulty walking, Impaired flexibility, Pain  Visit Diagnosis: Pain in left leg - Plan: PT plan of care cert/re-cert  Iliotibial band syndrome of left side - Plan: PT plan of care cert/re-cert  Difficulty in walking, not elsewhere classified - Plan: PT plan of care cert/re-cert      G-Codes - 05/22/16 0830    Functional Assessment Tool Used Foto 63% limitation   Functional Limitation Mobility: Walking and moving around   Mobility: Walking and Moving Around Current Status [phone]([id number]) At least 60 percent but less than 80 percent impaired, limited or restricted   Mobility: Walking and Moving Around Goal Status [phone]([id number]) At least 40 percent but less than 60 percent impaired, limited or restricted       Problem List Patient Active Problem List   Diagnosis Date Noted  . Muscle spasm of left lower extremity 05/11/2016  . Swelling of joint of left knee 05/11/2016  . Dysplasia of cervix, low grade (CIN 1) 05/11/2016  . High risk medication use 05/11/2016  . Thyroid activity decreased 11/19/2014  . Pain, joint, multiple sites 11/19/2014  . Numbness and tingling of left leg 04/06/2014  . Routine general medical examination at a health care facility 09/29/2013  . Osteoporosis, post-menopausal 04/21/2013  . Cough variant asthma 03/17/2013  . Psoriasis 03/17/2013  . Persistent dry cough 03/17/2013  . Osteoarthritis of left knee 03/03/2013  . Psoriasis-like skin disease 03/03/2013  . Eczema 01/07/2013  . Need for  prophylactic vaccination and inoculation against influenza 01/07/2013  . Prediabetes 12/17/2012  . Cough 12/17/2012  . Reactive airway disease with wheezing 12/03/2012  . URI, acute 12/03/2012  . Hypothyroidism 10/29/2012  . Unspecified vitamin D deficiency 10/29/2012  . Hyperlipidemia LDL goal <130 10/29/2012  . GERD (gastroesophageal reflux disease) 10/29/2012  . Osteoporosis 10/29/2012  . Myalgia and myositis 10/29/2012    Jearld LeschALBRIGHT,Chaselynn Kepple W., PT 05/22/2016, 8:35 AM  [facility] [address] [place], [place], [address] Phone: [phone]   Fax:  [phone]  Name: [name] [name] MRN: [id number] Date of Birth: 08/02/1945

## 2016-05-22 NOTE — Telephone Encounter (Signed)
Per Dr. Montez Moritaarter I sent in Zanaflex 4mg  TID PRN #60 with 3 RF to [facility]. Called and informed patient.

## 2016-05-25 ENCOUNTER — Ambulatory Visit: Payer: Medicare Other | Admitting: Physical Therapy

## 2016-05-25 ENCOUNTER — Encounter: Payer: Self-pay | Admitting: Physical Therapy

## 2016-05-25 DIAGNOSIS — M7632 Iliotibial band syndrome, left leg: Secondary | ICD-10-CM | POA: Diagnosis not present

## 2016-05-25 DIAGNOSIS — R262 Difficulty in walking, not elsewhere classified: Secondary | ICD-10-CM | POA: Diagnosis not present

## 2016-05-25 DIAGNOSIS — M79605 Pain in left leg: Secondary | ICD-10-CM

## 2016-05-25 NOTE — Therapy (Signed)
[facility] [place] [address] [place], [place], [address] Phone: [phone]   Fax:  [phone]  Physical Therapy Treatment  Patient Details  Name: [name] [name] MRN: [id number] Date of Birth: 1946/01/30 Referring Provider: Mckinley Jewel  Encounter Date: 05/25/2016      PT End of Session - 05/25/16 1006    Visit Number 2   Date for PT Re-Evaluation 07/20/16   PT Start Time 1027   PT Stop Time 1025   PT Time Calculation (min) 47 min      Past Medical History:  Diagnosis Date  . Acute bronchiolitis due to other infectious organisms   . Acute bronchiolitis due to respiratory syncytial virus (RSV)   . Arthritis    OA AND PAIN RIGHT KNEE  . Degeneration of intervertebral disc, site unspecified   . Disorder of bone and cartilage, unspecified   . Disturbance of skin sensation   . Encounter for long-term (current) use of other medications   . GERD (gastroesophageal reflux disease)    PT STATES SEVERE REFLUX IF SHE DOES NOT TAKE HER OMEPRAZOLE  . Headache(784.0)    WHEN REFLUX SEVERE  . Hypothyroidism   . Long term (current) use of anticoagulants   . Osteoarthrosis, unspecified whether generalized or localized, lower leg   . Osteoporosis, unspecified   . Other and unspecified hyperlipidemia   . Other atopic dermatitis and related conditions   . Other malaise and fatigue   . Other specified pre-operative examination 11/07/2011  . Thoracic or lumbosacral neuritis or radiculitis, unspecified   . Unspecified vitamin D deficiency   . Vegetarian diet     Past Surgical History:  Procedure Laterality Date  . CERVICAL BIOPSY  W/ LOOP ELECTRODE EXCISION    . EYE SURGERY  2016   Left eye, Cataract Removal  . TEETH EXTRACTIONS--NO OTHER SURGERY    . TOTAL KNEE ARTHROPLASTY  11/28/2011   Procedure: TOTAL KNEE ARTHROPLASTY;  Surgeon: Jacki Cones, MD;  Location: [facility];  Service: Orthopedics;  Laterality: Right;  .  TUBAL LIGATION      There were no vitals filed for this visit.      Subjective Assessment - 05/25/16 1000    Subjective 8 min late, no interperter- husband interpreted. Estin helped some   Patient is accompained by: Family member   Currently in Pain? Yes   Pain Score 3    Pain Location Leg   Pain Orientation Left;Lateral                         [facility] Adult PT Treatment/Exercise - 05/25/16 0001      Knee/Hip Exercises: Supine   Other Supine Knee/Hip Exercises Ball 15 reps- bridge, KTC and obl   Other Supine Knee/Hip Exercises ball squeeze, red tband hip abd     Moist Heat Therapy   Number Minutes Moist Heat 15 Minutes   Moist Heat Location Hip     Electrical Stimulation   Electrical Stimulation Location left ITB   Electrical Stimulation Action IFC   Electrical Stimulation Parameters rt SL   Electrical Stimulation Goals Pain     Manual Therapy   Manual Therapy Soft tissue mobilization;Passive ROM;Other (comment)  ITB stripping with foam roller   Manual therapy comments Left hip flex,quad and IT   Soft tissue mobilization hip flex, quad , ITB  PT Short Term Goals - 05/22/16 0829      PT SHORT TERM GOAL #1   Title independent with initial HEP   Time 2   Period Weeks   Status New           PT Long Term Goals - 05/22/16 16100829      PT LONG TERM GOAL #1   Title decrease pain 50%   Time 8   Period Weeks   Status New     PT LONG TERM GOAL #2   Title increase ROM of the left knee to 0-116 degrees flexion   Time 8   Period Weeks   Status New     PT LONG TERM GOAL #3   Title go up and down stairs step over step   Time 8   Period Weeks   Status New     PT LONG TERM GOAL #4   Title report sleep 50% better   Time 8   Period Weeks   Status New               Plan - 05/25/16 1006    Clinical Impression Statement pt vey tight in Left hip flex and lat quad-rsponded well to MT and addition of ther ex.    PT  Next Visit Plan assess and add to HEP      Patient will benefit from skilled therapeutic intervention in order to improve the following deficits and impairments:  Abnormal gait, Decreased mobility, Decreased range of motion, Decreased strength, Difficulty walking, Impaired flexibility, Pain  Visit Diagnosis: Pain in left leg  Iliotibial band syndrome of left side     Problem List Patient Active Problem List   Diagnosis Date Noted  . Muscle spasm of left lower extremity 05/11/2016  . Swelling of joint of left knee 05/11/2016  . Dysplasia of cervix, low grade (CIN 1) 05/11/2016  . High risk medication use 05/11/2016  . Thyroid activity decreased 11/19/2014  . Pain, joint, multiple sites 11/19/2014  . Numbness and tingling of left leg 04/06/2014  . Routine general medical examination at a health care facility 09/29/2013  . Osteoporosis, post-menopausal 04/21/2013  . Cough variant asthma 03/17/2013  . Psoriasis 03/17/2013  . Persistent dry cough 03/17/2013  . Osteoarthritis of left knee 03/03/2013  . Psoriasis-like skin disease 03/03/2013  . Eczema 01/07/2013  . Need for prophylactic vaccination and inoculation against influenza 01/07/2013  . Prediabetes 12/17/2012  . Cough 12/17/2012  . Reactive airway disease with wheezing 12/03/2012  . URI, acute 12/03/2012  . Hypothyroidism 10/29/2012  . Unspecified vitamin D deficiency 10/29/2012  . Hyperlipidemia LDL goal <130 10/29/2012  . GERD (gastroesophageal reflux disease) 10/29/2012  . Osteoporosis 10/29/2012  . Myalgia and myositis 10/29/2012    PAYSEUR,ANGIE PTA 05/25/2016, 10:07 AM  [facility] [place] [address] [place], [place], [address] Phone: [phone]   Fax:  [phone]  Name: [name] [name] MRN: [id number] Date of Birth: 11/24/1945

## 2016-05-29 ENCOUNTER — Ambulatory Visit: Payer: Medicare Other | Admitting: Physical Therapy

## 2016-05-29 ENCOUNTER — Encounter: Payer: Self-pay | Admitting: Physical Therapy

## 2016-05-29 DIAGNOSIS — M7632 Iliotibial band syndrome, left leg: Secondary | ICD-10-CM

## 2016-05-29 DIAGNOSIS — M79605 Pain in left leg: Secondary | ICD-10-CM

## 2016-05-29 DIAGNOSIS — R262 Difficulty in walking, not elsewhere classified: Secondary | ICD-10-CM | POA: Diagnosis not present

## 2016-05-29 NOTE — Therapy (Signed)
[facility] [place] [facility] [address] [place], [place], [address] Phone: [phone]   Fax:  [phone]  Physical Therapy Treatment  Patient Details  Name: [name] [name] MRN: [id number] Date of Birth: 1945-11-14 Referring Provider: Mckinley Jewel  Encounter Date: 05/29/2016      PT End of Session - 05/29/16 1205    Visit Number 3   Date for PT Re-Evaluation 07/20/16   PT Start Time 1122   PT Stop Time 1225   PT Time Calculation (min) 63 min      Past Medical History:  Diagnosis Date  . Acute bronchiolitis due to other infectious organisms   . Acute bronchiolitis due to respiratory syncytial virus (RSV)   . Arthritis    OA AND PAIN RIGHT KNEE  . Degeneration of intervertebral disc, site unspecified   . Disorder of bone and cartilage, unspecified   . Disturbance of skin sensation   . Encounter for long-term (current) use of other medications   . GERD (gastroesophageal reflux disease)    PT STATES SEVERE REFLUX IF SHE DOES NOT TAKE HER OMEPRAZOLE  . Headache(784.0)    WHEN REFLUX SEVERE  . Hypothyroidism   . Long term (current) use of anticoagulants   . Osteoarthrosis, unspecified whether generalized or localized, lower leg   . Osteoporosis, unspecified   . Other and unspecified hyperlipidemia   . Other atopic dermatitis and related conditions   . Other malaise and fatigue   . Other specified pre-operative examination 11/07/2011  . Thoracic or lumbosacral neuritis or radiculitis, unspecified   . Unspecified vitamin D deficiency   . Vegetarian diet     Past Surgical History:  Procedure Laterality Date  . CERVICAL BIOPSY  W/ LOOP ELECTRODE EXCISION    . EYE SURGERY  2016   Left eye, Cataract Removal  . TEETH EXTRACTIONS--NO OTHER SURGERY    . TOTAL KNEE ARTHROPLASTY  11/28/2011   Procedure: TOTAL KNEE ARTHROPLASTY;  Surgeon: Jacki Cones, MD;  Location: [facility];  Service: Orthopedics;  Laterality: Right;  .  TUBAL LIGATION      There were no vitals filed for this visit.      Subjective Assessment - 05/29/16 1125    Subjective tightness, no pain- left lat leg ( 2 days ago alot of pain) , most pain at night   Patient is accompained by: Family member   Currently in Pain? No/denies                         [facility] Adult PT Treatment/Exercise - 05/29/16 0001      Knee/Hip Exercises: Aerobic   Stationary Bike 6 min   Nustep L 4 5 min     Knee/Hip Exercises: Machines for Strengthening   Cybex Leg Press 20# 2 sets 10 with ball squeeze     Knee/Hip Exercises: Standing   Other Standing Knee Exercises red tband hip 3 way 15 times each  UE support     Knee/Hip Exercises: Seated   Other Seated Knee/Hip Exercises red tband hip IR/ER 10 each     Moist Heat Therapy   Number Minutes Moist Heat 15 Minutes   Moist Heat Location Hip     Electrical Stimulation   Electrical Stimulation Location left ITB   Electrical Stimulation Action IFC   Electrical Stimulation Parameters rt SL   Electrical Stimulation Goals Pain     Manual Therapy   Manual Therapy Soft tissue mobilization  Manual therapy comments Left hip flex,quad and IT   Soft tissue mobilization Left ITB stripping                  PT Short Term Goals - 05/22/16 0829      PT SHORT TERM GOAL #1   Title independent with initial HEP   Time 2   Period Weeks   Status New           PT Long Term Goals - 05/22/16 16100829      PT LONG TERM GOAL #1   Title decrease pain 50%   Time 8   Period Weeks   Status New     PT LONG TERM GOAL #2   Title increase ROM of the left knee to 0-116 degrees flexion   Time 8   Period Weeks   Status New     PT LONG TERM GOAL #3   Title go up and down stairs step over step   Time 8   Period Weeks   Status New     PT LONG TERM GOAL #4   Title report sleep 50% better   Time 8   Period Weeks   Status New               Plan - 05/29/16 1206    Clinical  Impression Statement some increased pain and fatigue with ther ex today,cuing for correct speed and body mechanics. still very tight in lat IT and lat quad.   PT Next Visit Plan increase HEP,lat flexibility and strength      Patient will benefit from skilled therapeutic intervention in order to improve the following deficits and impairments:  Abnormal gait, Decreased mobility, Decreased range of motion, Decreased strength, Difficulty walking, Impaired flexibility, Pain  Visit Diagnosis: Pain in left leg  Iliotibial band syndrome of left side  Difficulty in walking, not elsewhere classified     Problem List Patient Active Problem List   Diagnosis Date Noted  . Muscle spasm of left lower extremity 05/11/2016  . Swelling of joint of left knee 05/11/2016  . Dysplasia of cervix, low grade (CIN 1) 05/11/2016  . High risk medication use 05/11/2016  . Thyroid activity decreased 11/19/2014  . Pain, joint, multiple sites 11/19/2014  . Numbness and tingling of left leg 04/06/2014  . Routine general medical examination at a health care facility 09/29/2013  . Osteoporosis, post-menopausal 04/21/2013  . Cough variant asthma 03/17/2013  . Psoriasis 03/17/2013  . Persistent dry cough 03/17/2013  . Osteoarthritis of left knee 03/03/2013  . Psoriasis-like skin disease 03/03/2013  . Eczema 01/07/2013  . Need for prophylactic vaccination and inoculation against influenza 01/07/2013  . Prediabetes 12/17/2012  . Cough 12/17/2012  . Reactive airway disease with wheezing 12/03/2012  . URI, acute 12/03/2012  . Hypothyroidism 10/29/2012  . Unspecified vitamin D deficiency 10/29/2012  . Hyperlipidemia LDL goal <130 10/29/2012  . GERD (gastroesophageal reflux disease) 10/29/2012  . Osteoporosis 10/29/2012  . Myalgia and myositis 10/29/2012    Alauna Hayden,ANGIE  PTA 05/29/2016, 12:09 PM  [facility] [address] [place], [place],  [address] Phone: [phone]   Fax:  [phone]  Name: Eston MouldSudhaben M Cleek MRN: [id number] Date of Birth: 01/03/1946

## 2016-05-30 ENCOUNTER — Ambulatory Visit: Payer: Medicare Other | Admitting: Physical Therapy

## 2016-05-30 ENCOUNTER — Encounter: Payer: Self-pay | Admitting: Physical Therapy

## 2016-05-30 DIAGNOSIS — M79605 Pain in left leg: Secondary | ICD-10-CM

## 2016-05-30 DIAGNOSIS — R262 Difficulty in walking, not elsewhere classified: Secondary | ICD-10-CM | POA: Diagnosis not present

## 2016-05-30 DIAGNOSIS — M7632 Iliotibial band syndrome, left leg: Secondary | ICD-10-CM

## 2016-05-30 NOTE — Therapy (Signed)
[facility] [place] [address] [place], [place], [address] Phone: [phone]   Fax:  [phone]  Physical Therapy Treatment  Patient Details  Name: [name] [name] MRN: [id number] Date of Birth: 10/13/1945 Referring Provider: Mckinley Jewel  Encounter Date: 05/30/2016      PT End of Session - 05/30/16 1140    Visit Number 4   Date for PT Re-Evaluation 07/20/16   PT Start Time 1055   PT Stop Time 1154   PT Time Calculation (min) 59 min   Activity Tolerance Patient tolerated treatment well   Behavior During Therapy [facility] for tasks assessed/performed      Past Medical History:  Diagnosis Date  . Acute bronchiolitis due to other infectious organisms   . Acute bronchiolitis due to respiratory syncytial virus (RSV)   . Arthritis    OA AND PAIN RIGHT KNEE  . Degeneration of intervertebral disc, site unspecified   . Disorder of bone and cartilage, unspecified   . Disturbance of skin sensation   . Encounter for long-term (current) use of other medications   . GERD (gastroesophageal reflux disease)    PT STATES SEVERE REFLUX IF SHE DOES NOT TAKE HER OMEPRAZOLE  . Headache(784.0)    WHEN REFLUX SEVERE  . Hypothyroidism   . Long term (current) use of anticoagulants   . Osteoarthrosis, unspecified whether generalized or localized, lower leg   . Osteoporosis, unspecified   . Other and unspecified hyperlipidemia   . Other atopic dermatitis and related conditions   . Other malaise and fatigue   . Other specified pre-operative examination 11/07/2011  . Thoracic or lumbosacral neuritis or radiculitis, unspecified   . Unspecified vitamin D deficiency   . Vegetarian diet     Past Surgical History:  Procedure Laterality Date  . CERVICAL BIOPSY  W/ LOOP ELECTRODE EXCISION    . EYE SURGERY  2016   Left eye, Cataract Removal  . TEETH EXTRACTIONS--NO OTHER SURGERY    . TOTAL KNEE ARTHROPLASTY  11/28/2011   Procedure: TOTAL KNEE  ARTHROPLASTY;  Surgeon: Jacki Cones, MD;  Location: [facility];  Service: Orthopedics;  Laterality: Right;  . TUBAL LIGATION      There were no vitals filed for this visit.      Subjective Assessment - 05/30/16 1055    Subjective No pain, only at night and after last treatment she had groin pain.   Patient is accompained by: Family member   Limitations Standing;Walking;House hold activities   Currently in Pain? No/denies                         [facility] Adult PT Treatment/Exercise - 05/30/16 0001      Knee/Hip Exercises: Aerobic   Stationary Bike 6 min   Nustep lvl 5 4 min      Knee/Hip Exercises: Machines for Strengthening   Cybex Leg Press 20# 2 sets 10 with ball squeeze     Knee/Hip Exercises: Standing   Hip Extension 2 sets;AROM;Stengthening;Left;10 reps;Knee straight   SLS SLS squats 2x5 each   Other Standing Knee Exercises red tband 2 way hip flexion/abd.      Knee/Hip Exercises: Seated   Clamshell with TheraBand Red  2x10      Moist Heat Therapy   Number Minutes Moist Heat 15 Minutes   Moist Heat Location Hip     Electrical Stimulation   Electrical Stimulation Location left ITB   Electrical Stimulation Action  IFC   Electrical Stimulation Parameters RT sidelying   Electrical Stimulation Goals Pain     Manual Therapy   Manual Therapy Soft tissue mobilization   Manual therapy comments Left hip flex,quad and IT   Soft tissue mobilization Left ITB stripping                  PT Short Term Goals - 05/22/16 0829      PT SHORT TERM GOAL #1   Title independent with initial HEP   Time 2   Period Weeks   Status New           PT Long Term Goals - 05/22/16 1610      PT LONG TERM GOAL #1   Title decrease pain 50%   Time 8   Period Weeks   Status New     PT LONG TERM GOAL #2   Title increase ROM of the left knee to 0-116 degrees flexion   Time 8   Period Weeks   Status New     PT LONG TERM GOAL #3   Title go up and down  stairs step over step   Time 8   Period Weeks   Status New     PT LONG TERM GOAL #4   Title report sleep 50% better   Time 8   Period Weeks   Status New               Plan - 05/30/16 1141    Clinical Impression Statement Patient tolerated therapy well but was sore and having pain due to yesterdays treatment. She needed mod. VC on correct technique and posture throughout treatment. STM did not cause as much pain today as yesterdays did.    Rehab Potential Good   PT Frequency 2x / week   PT Duration 8 weeks   PT Treatment/Interventions Cryotherapy;Electrical Stimulation;Iontophoresis 4mg /ml Dexamethasone;Moist Heat;Ultrasound;Gait training;Functional mobility training;Patient/family education;Balance training;Therapeutic exercise;Therapeutic activities;Manual techniques;Taping;Vasopneumatic Device   PT Next Visit Plan increase HEP,lat flexibility and strength      Patient will benefit from skilled therapeutic intervention in order to improve the following deficits and impairments:  Abnormal gait, Decreased mobility, Decreased range of motion, Decreased strength, Difficulty walking, Impaired flexibility, Pain  Visit Diagnosis: Pain in left leg  Iliotibial band syndrome of left side  Difficulty in walking, not elsewhere classified     Problem List Patient Active Problem List   Diagnosis Date Noted  . Muscle spasm of left lower extremity 05/11/2016  . Swelling of joint of left knee 05/11/2016  . Dysplasia of cervix, low grade (CIN 1) 05/11/2016  . High risk medication use 05/11/2016  . Thyroid activity decreased 11/19/2014  . Pain, joint, multiple sites 11/19/2014  . Numbness and tingling of left leg 04/06/2014  . Routine general medical examination at a health care facility 09/29/2013  . Osteoporosis, post-menopausal 04/21/2013  . Cough variant asthma 03/17/2013  . Psoriasis 03/17/2013  . Persistent dry cough 03/17/2013  . Osteoarthritis of left knee 03/03/2013   . Psoriasis-like skin disease 03/03/2013  . Eczema 01/07/2013  . Need for prophylactic vaccination and inoculation against influenza 01/07/2013  . Prediabetes 12/17/2012  . Cough 12/17/2012  . Reactive airway disease with wheezing 12/03/2012  . URI, acute 12/03/2012  . Hypothyroidism 10/29/2012  . Unspecified vitamin D deficiency 10/29/2012  . Hyperlipidemia LDL goal <130 10/29/2012  . GERD (gastroesophageal reflux disease) 10/29/2012  . Osteoporosis 10/29/2012  . Myalgia and myositis 10/29/2012    [place], [place]  05/30/2016, 11:49 AM  [facility] [address] [place], [place], [address] Phone: [phone]   Fax:  [phone]  Name: [name] [name] MRN: [id number] Date of Birth: 08/30/1945

## 2016-05-30 NOTE — Therapy (Deleted)
[facility] [address] [place], [place], [address] Phone: [phone]   Fax:  [phone]  Physical Therapy Treatment  Patient Details  Name: [name] [name] MRN: [id number] Date of Birth: 1945/08/09 Referring Provider: Mckinley Jewel  Encounter Date: 05/30/2016      PT End of Session - 05/30/16 1140    Visit Number 4   Date for PT Re-Evaluation 07/20/16   PT Start Time 1055   PT Stop Time 1145   PT Time Calculation (min) 50 min   Activity Tolerance Patient tolerated treatment well   Behavior During Therapy [facility] for tasks assessed/performed      Past Medical History:  Diagnosis Date  . Acute bronchiolitis due to other infectious organisms   . Acute bronchiolitis due to respiratory syncytial virus (RSV)   . Arthritis    OA AND PAIN RIGHT KNEE  . Degeneration of intervertebral disc, site unspecified   . Disorder of bone and cartilage, unspecified   . Disturbance of skin sensation   . Encounter for long-term (current) use of other medications   . GERD (gastroesophageal reflux disease)    PT STATES SEVERE REFLUX IF SHE DOES NOT TAKE HER OMEPRAZOLE  . Headache(784.0)    WHEN REFLUX SEVERE  . Hypothyroidism   . Long term (current) use of anticoagulants   . Osteoarthrosis, unspecified whether generalized or localized, lower leg   . Osteoporosis, unspecified   . Other and unspecified hyperlipidemia   . Other atopic dermatitis and related conditions   . Other malaise and fatigue   . Other specified pre-operative examination 11/07/2011  . Thoracic or lumbosacral neuritis or radiculitis, unspecified   . Unspecified vitamin D deficiency   . Vegetarian diet     Past Surgical History:  Procedure Laterality Date  . CERVICAL BIOPSY  W/ LOOP ELECTRODE EXCISION    . EYE SURGERY  2016   Left eye, Cataract Removal  . TEETH EXTRACTIONS--NO OTHER SURGERY    . TOTAL KNEE ARTHROPLASTY  11/28/2011   Procedure: TOTAL KNEE  ARTHROPLASTY;  Surgeon: Jacki Cones, MD;  Location: [facility];  Service: Orthopedics;  Laterality: Right;  . TUBAL LIGATION      There were no vitals filed for this visit.      Subjective Assessment - 05/30/16 1055    Subjective No pain, only at night and after last treatment she had groin pain.   Patient is accompained by: Family member   Limitations Standing;Walking;House hold activities   Currently in Pain? No/denies                         [facility] Adult PT Treatment/Exercise - 05/30/16 0001      Knee/Hip Exercises: Aerobic   Stationary Bike 6 min   Nustep lvl 5 4 min      Knee/Hip Exercises: Machines for Strengthening   Cybex Leg Press 20# 2 sets 10 with ball squeeze     Knee/Hip Exercises: Standing   Hip Extension 2 sets;AROM;Stengthening;Left;10 reps;Knee straight   SLS SLS squats 2x5 each   Other Standing Knee Exercises red tband 2 way hip flexion/abd.      Knee/Hip Exercises: Seated   Clamshell with TheraBand Red  2x10      Moist Heat Therapy   Number Minutes Moist Heat 15 Minutes   Moist Heat Location Hip     Electrical Stimulation   Electrical Stimulation Location left ITB   Electrical Stimulation Action  IFC   Electrical Stimulation Parameters RT sidelying   Electrical Stimulation Goals Pain     Manual Therapy   Manual Therapy Soft tissue mobilization   Manual therapy comments Left hip flex,quad and IT   Soft tissue mobilization Left ITB stripping                  PT Short Term Goals - 05/22/16 0829      PT SHORT TERM GOAL #1   Title independent with initial HEP   Time 2   Period Weeks   Status New           PT Long Term Goals - 05/22/16 2130      PT LONG TERM GOAL #1   Title decrease pain 50%   Time 8   Period Weeks   Status New     PT LONG TERM GOAL #2   Title increase ROM of the left knee to 0-116 degrees flexion   Time 8   Period Weeks   Status New     PT LONG TERM GOAL #3   Title go up and down  stairs step over step   Time 8   Period Weeks   Status New     PT LONG TERM GOAL #4   Title report sleep 50% better   Time 8   Period Weeks   Status New               Plan - 05/30/16 1141    Clinical Impression Statement Patient tolerated therapy well but was sore and having pain due to yesterdays treatment. She needed mod. VC on correct technique and posture throughout treatment. STM did not cause as much pain today as yesterdays did.    Rehab Potential Good   PT Frequency 2x / week   PT Duration 8 weeks   PT Treatment/Interventions Cryotherapy;Electrical Stimulation;Iontophoresis 4mg /ml Dexamethasone;Moist Heat;Ultrasound;Gait training;Functional mobility training;Patient/family education;Balance training;Therapeutic exercise;Therapeutic activities;Manual techniques;Taping;Vasopneumatic Device   PT Next Visit Plan increase HEP,lat flexibility and strength      Patient will benefit from skilled therapeutic intervention in order to improve the following deficits and impairments:  Abnormal gait, Decreased mobility, Decreased range of motion, Decreased strength, Difficulty walking, Impaired flexibility, Pain  Visit Diagnosis: Pain in left leg  Iliotibial band syndrome of left side  Difficulty in walking, not elsewhere classified     Problem List Patient Active Problem List   Diagnosis Date Noted  . Muscle spasm of left lower extremity 05/11/2016  . Swelling of joint of left knee 05/11/2016  . Dysplasia of cervix, low grade (CIN 1) 05/11/2016  . High risk medication use 05/11/2016  . Thyroid activity decreased 11/19/2014  . Pain, joint, multiple sites 11/19/2014  . Numbness and tingling of left leg 04/06/2014  . Routine general medical examination at a health care facility 09/29/2013  . Osteoporosis, post-menopausal 04/21/2013  . Cough variant asthma 03/17/2013  . Psoriasis 03/17/2013  . Persistent dry cough 03/17/2013  . Osteoarthritis of left knee 03/03/2013   . Psoriasis-like skin disease 03/03/2013  . Eczema 01/07/2013  . Need for prophylactic vaccination and inoculation against influenza 01/07/2013  . Prediabetes 12/17/2012  . Cough 12/17/2012  . Reactive airway disease with wheezing 12/03/2012  . URI, acute 12/03/2012  . Hypothyroidism 10/29/2012  . Unspecified vitamin D deficiency 10/29/2012  . Hyperlipidemia LDL goal <130 10/29/2012  . GERD (gastroesophageal reflux disease) 10/29/2012  . Osteoporosis 10/29/2012  . Myalgia and myositis 10/29/2012    [place], [place]  05/30/2016, 11:45 AM  [facility] [address] [place], [place], [address] Phone: [phone]   Fax:  [phone]  Name: [name] [name] MRN: [id number] Date of Birth: 10/11/1945

## 2016-05-31 ENCOUNTER — Encounter: Payer: Self-pay | Admitting: Physical Therapy

## 2016-05-31 ENCOUNTER — Ambulatory Visit: Payer: Medicare Other | Admitting: Physical Therapy

## 2016-05-31 DIAGNOSIS — M7632 Iliotibial band syndrome, left leg: Secondary | ICD-10-CM | POA: Diagnosis not present

## 2016-05-31 DIAGNOSIS — M79605 Pain in left leg: Secondary | ICD-10-CM

## 2016-05-31 DIAGNOSIS — R262 Difficulty in walking, not elsewhere classified: Secondary | ICD-10-CM

## 2016-05-31 NOTE — Therapy (Signed)
[place], [place], [address] Phone: [phone]   Fax:  [phone]  Physical Therapy Treatment  Patient Details  Name: [name] [name] MRN: [id number] Date of Birth: 01/08/1946 Referring Provider: Kathryne Hitch  Encounter Date: 05/31/2016      PT End of Session - 05/31/16 0924    Visit Number 5   Date for PT Re-Evaluation 07/20/16   PT Start Time 0840   PT Stop Time 0937   PT Time Calculation (min) 57 min   Activity Tolerance Patient tolerated treatment well   Behavior During Therapy [facility] for tasks assessed/performed      Past Medical History:  Diagnosis Date  . Acute bronchiolitis due to other infectious organisms   . Acute bronchiolitis due to respiratory syncytial virus (RSV)   . Arthritis    OA AND PAIN RIGHT KNEE  . Degeneration of intervertebral disc, site unspecified   . Disorder of bone and cartilage, unspecified   . Disturbance of skin sensation   . Encounter for long-term (current) use of other medications   . GERD (gastroesophageal reflux disease)    PT STATES SEVERE REFLUX IF SHE DOES NOT TAKE HER OMEPRAZOLE  . Headache(784.0)    WHEN REFLUX SEVERE  . Hypothyroidism   . Long term (current) use of anticoagulants   . Osteoarthrosis, unspecified whether generalized or localized, lower leg   . Osteoporosis, unspecified   . Other and unspecified hyperlipidemia   . Other atopic dermatitis and related conditions   . Other malaise and fatigue   . Other specified pre-operative examination 11/07/2011  . Thoracic or lumbosacral neuritis or radiculitis, unspecified   . Unspecified vitamin D deficiency   . Vegetarian diet     Past Surgical History:  Procedure Laterality Date  . CERVICAL BIOPSY  W/ LOOP ELECTRODE EXCISION    . EYE SURGERY  2016   Left eye, Cataract Removal  . TEETH EXTRACTIONS--NO OTHER SURGERY    . TOTAL KNEE ARTHROPLASTY  11/28/2011   Procedure: TOTAL KNEE  ARTHROPLASTY;  Surgeon: Tobi Bastos, MD;  Location: [facility];  Service: Orthopedics;  Laterality: Right;  . TUBAL LIGATION      There were no vitals filed for this visit.      Subjective Assessment - 05/31/16 0841    Subjective Pt's husband reports that she has pain at nights sometimes in the lateral L thigh   Currently in Pain? No/denies   Pain Score 0-No pain                         [facility] Adult PT Treatment/Exercise - 05/31/16 0001      Knee/Hip Exercises: Stretches   Other Knee/Hip Stretches Thomas stretch LLE multiple reps with different hold times     Knee/Hip Exercises: Aerobic   Nustep lvl 5 4 min      Knee/Hip Exercises: Standing   Other Standing Knee Exercises Standing march, standing hip abd 3lb 2x10     Knee/Hip Exercises: Supine   Straight Leg Raises Left;2 sets;10 reps   Straight Leg Raises Limitations 2   Other Supine Knee/Hip Exercises hip abd 2lb 3x5     Moist Heat Therapy   Number Minutes Moist Heat 15 Minutes   Moist Heat Location Hip     Electrical Stimulation   Electrical Stimulation Location left ITB   Electrical Stimulation Action IFC   Electrical Stimulation Parameters PT sidelying   Electrical  Stimulation Goals Pain     Manual Therapy   Manual Therapy Soft tissue mobilization   Manual therapy comments Left hip flex,quad and IT   Soft tissue mobilization Left ITB stripping                  PT Short Term Goals - 05/22/16 2952      PT SHORT TERM GOAL #1   Title independent with initial HEP   Time 2   Period Weeks   Status New           PT Long Term Goals - 05/31/16 8413      PT LONG TERM GOAL #1   Title decrease pain 50%   Status Partially Met     PT LONG TERM GOAL #2   Title increase ROM of the left knee to 0-116 degrees flexion   Status On-going     PT LONG TERM GOAL #3   Title go up and down stairs step over step   Status On-going     PT LONG TERM GOAL #4   Title report sleep 50% better    Status On-going               Plan - 05/31/16 0926    Clinical Impression Statement Pt able to complete all of today's interventions, she only reports occasional lateral thigh pain at nights. Pt with some increase soreness during supine exercises. Pt with little pain during thomas stretch.    Rehab Potential Good   PT Frequency 2x / week   PT Duration 8 weeks   PT Treatment/Interventions Cryotherapy;Electrical Stimulation;Iontophoresis 41m/ml Dexamethasone;Moist Heat;Ultrasound;Gait training;Functional mobility training;Patient/family education;Balance training;Therapeutic exercise;Therapeutic activities;Manual techniques;Taping;Vasopneumatic Device   PT Next Visit Plan increase HEP,lat flexibility and strength      Patient will benefit from skilled therapeutic intervention in order to improve the following deficits and impairments:  Abnormal gait, Decreased mobility, Decreased range of motion, Decreased strength, Difficulty walking, Impaired flexibility, Pain  Visit Diagnosis: Pain in left leg  Iliotibial band syndrome of left side  Difficulty in walking, not elsewhere classified     Problem List Patient Active Problem List   Diagnosis Date Noted  . Muscle spasm of left lower extremity 05/11/2016  . Swelling of joint of left knee 05/11/2016  . Dysplasia of cervix, low grade (CIN 1) 05/11/2016  . High risk medication use 05/11/2016  . Thyroid activity decreased 11/19/2014  . Pain, joint, multiple sites 11/19/2014  . Numbness and tingling of left leg 04/06/2014  . Routine general medical examination at a health care facility 09/29/2013  . Osteoporosis, post-menopausal 04/21/2013  . Cough variant asthma 03/17/2013  . Psoriasis 03/17/2013  . Persistent dry cough 03/17/2013  . Osteoarthritis of left knee 03/03/2013  . Psoriasis-like skin disease 03/03/2013  . Eczema 01/07/2013  . Need for prophylactic vaccination and inoculation against influenza 01/07/2013  .  Prediabetes 12/17/2012  . Cough 12/17/2012  . Reactive airway disease with wheezing 12/03/2012  . URI, acute 12/03/2012  . Hypothyroidism 10/29/2012  . Unspecified vitamin D deficiency 10/29/2012  . Hyperlipidemia LDL goal <130 10/29/2012  . GERD (gastroesophageal reflux disease) 10/29/2012  . Osteoporosis 10/29/2012  . Myalgia and myositis 10/29/2012    RScot Jun PTA 05/31/2016, 9:30 AM  [facility] [place] [address]Phone: [phone]  Fax:  [phone] Name: [name] HANNIBALMRN: [id number] of Birth: 306/05/1945

## 2016-06-01 ENCOUNTER — Ambulatory Visit: Payer: Medicare Other | Admitting: Physical Therapy

## 2016-06-05 ENCOUNTER — Encounter: Payer: Self-pay | Admitting: Physical Therapy

## 2016-06-05 ENCOUNTER — Ambulatory Visit: Payer: Medicare Other | Admitting: Physical Therapy

## 2016-06-05 DIAGNOSIS — M79605 Pain in left leg: Secondary | ICD-10-CM | POA: Diagnosis not present

## 2016-06-05 DIAGNOSIS — R262 Difficulty in walking, not elsewhere classified: Secondary | ICD-10-CM | POA: Diagnosis not present

## 2016-06-05 DIAGNOSIS — M7632 Iliotibial band syndrome, left leg: Secondary | ICD-10-CM | POA: Diagnosis not present

## 2016-06-05 NOTE — Therapy (Signed)
[place], [place], [address] Phone: [phone]   Fax:  [phone]  Physical Therapy Treatment  Patient Details  Name: [name] [name] MRN: [id number] Date of Birth: [birth year] Referring Provider: Kathryne Hitch  Encounter Date: 06/05/2016      PT End of Session - 06/05/16 1142    Visit Number 6   Date for PT Re-Evaluation 07/20/16   PT Start Time 1100   PT Stop Time 1158   PT Time Calculation (min) 58 min   Activity Tolerance Patient tolerated treatment well   Behavior During Therapy [facility] for tasks assessed/performed      Past Medical History:  Diagnosis Date  . Acute bronchiolitis due to other infectious organisms   . Acute bronchiolitis due to respiratory syncytial virus (RSV)   . Arthritis    OA AND PAIN RIGHT KNEE  . Degeneration of intervertebral disc, site unspecified   . Disorder of bone and cartilage, unspecified   . Disturbance of skin sensation   . Encounter for long-term (current) use of other medications   . GERD (gastroesophageal reflux disease)    PT STATES SEVERE REFLUX IF SHE DOES NOT TAKE HER OMEPRAZOLE  . Headache(784.0)    WHEN REFLUX SEVERE  . Hypothyroidism   . Long term (current) use of anticoagulants   . Osteoarthrosis, unspecified whether generalized or localized, lower leg   . Osteoporosis, unspecified   . Other and unspecified hyperlipidemia   . Other atopic dermatitis and related conditions   . Other malaise and fatigue   . Other specified pre-operative examination 11/07/2011  . Thoracic or lumbosacral neuritis or radiculitis, unspecified   . Unspecified vitamin D deficiency   . Vegetarian diet     Past Surgical History:  Procedure Laterality Date  . CERVICAL BIOPSY  W/ LOOP ELECTRODE EXCISION    . EYE SURGERY  2016   Left eye, Cataract Removal  . TEETH EXTRACTIONS--NO OTHER SURGERY    . TOTAL KNEE ARTHROPLASTY  11/28/2011   Procedure: TOTAL KNEE  ARTHROPLASTY;  Surgeon: Tobi Bastos, MD;  Location: [facility];  Service: Orthopedics;  Laterality: Right;  . TUBAL LIGATION      There were no vitals filed for this visit.      Subjective Assessment - 06/05/16 1059    Subjective Pt reports no change at all. Pt reports a running pain in lateral L quad at night   Currently in Pain? No/denies   Pain Score 0-No pain                         [facility] Adult PT Treatment/Exercise - 06/05/16 0001      Knee/Hip Exercises: Stretches   Other Knee/Hip Stretches Thomas stretch LLE multiple reps with different hold times   Other Knee/Hip Stretches ITB stretch 3 x10 sec     Knee/Hip Exercises: Aerobic   Stationary Bike 4 min    Nustep lvl 5 4 min      Knee/Hip Exercises: Standing   Other Standing Knee Exercises standing hip abd and ext 3lb 2x10     Knee/Hip Exercises: Seated   Marching Both;2 sets;10 reps   Marching Limitations 3   Abduction/Adduction  2 sets;20 reps   Abd/Adduction Limitations blue tband     Knee/Hip Exercises: Supine   Straight Leg Raises Left;10 reps;1 set   Straight Leg Raises Limitations 3   Other Supine Knee/Hip Exercises hip abd 3lb x10;  Hip abd/SLY combo 2x5       Moist Heat Therapy   Number Minutes Moist Heat 15 Minutes   Moist Heat Location Hip     Electrical Stimulation   Electrical Stimulation Location left ITB   Electrical Stimulation Action IFC   Electrical Stimulation Parameters Py sidelying   Electrical Stimulation Goals Pain                  PT Short Term Goals - 05/22/16 0829      PT SHORT TERM GOAL #1   Title independent with initial HEP   Time 2   Period Weeks   Status New           PT Long Term Goals - 06/05/16 1143      PT LONG TERM GOAL #1   Title decrease pain 50%   Status Partially Met     PT LONG TERM GOAL #2   Title increase ROM of the left knee to 0-116 degrees flexion               Plan - 06/05/16 1144    Clinical Impression  Statement Pt again able to complete all of today's exercises, she reports LLE soreness with supine interventions. Pt continues to reports no pain in the day only at nights when lying straight. Pt reports that she does most of the cooking for 6 people daily and washes dishes. Expressed to pt that this could be the cause of some of her LE soreness and pain at night.   Rehab Potential Good   PT Frequency 2x / week   PT Duration 8 weeks   PT Treatment/Interventions Cryotherapy;Electrical Stimulation;Iontophoresis 39m/ml Dexamethasone;Moist Heat;Ultrasound;Gait training;Functional mobility training;Patient/family education;Balance training;Therapeutic exercise;Therapeutic activities;Manual techniques;Taping;Vasopneumatic Device   PT Next Visit Plan lat flexibility and strength      Patient will benefit from skilled therapeutic intervention in order to improve the following deficits and impairments:  Abnormal gait, Decreased mobility, Decreased range of motion, Decreased strength, Difficulty walking, Impaired flexibility, Pain  Visit Diagnosis: Pain in left leg  Iliotibial band syndrome of left side  Difficulty in walking, not elsewhere classified     Problem List Patient Active Problem List   Diagnosis Date Noted  . Muscle spasm of left lower extremity 05/11/2016  . Swelling of joint of left knee 05/11/2016  . Dysplasia of cervix, low grade (CIN 1) 05/11/2016  . High risk medication use 05/11/2016  . Thyroid activity decreased 11/19/2014  . Pain, joint, multiple sites 11/19/2014  . Numbness and tingling of left leg 04/06/2014  . Routine general medical examination at a health care facility 09/29/2013  . Osteoporosis, post-menopausal 04/21/2013  . Cough variant asthma 03/17/2013  . Psoriasis 03/17/2013  . Persistent dry cough 03/17/2013  . Osteoarthritis of left knee 03/03/2013  . Psoriasis-like skin disease 03/03/2013  . Eczema 01/07/2013  . Need for prophylactic vaccination and  inoculation against influenza 01/07/2013  . Prediabetes 12/17/2012  . Cough 12/17/2012  . Reactive airway disease with wheezing 12/03/2012  . URI, acute 12/03/2012  . Hypothyroidism 10/29/2012  . Unspecified vitamin D deficiency 10/29/2012  . Hyperlipidemia LDL goal <130 10/29/2012  . GERD (gastroesophageal reflux disease) 10/29/2012  . Osteoporosis 10/29/2012  . Myalgia and myositis 10/29/2012    RScot Jun1/30/2018, 11:47 AM  [address] [address]Phone: [phone]  Fax:  [phone] Name: SSONIYA ASHRAFMRN: [id number] of  Birth: Sep 12, 1945

## 2016-06-07 ENCOUNTER — Ambulatory Visit: Payer: Medicare Other | Attending: Orthopedic Surgery | Admitting: Physical Therapy

## 2016-06-07 ENCOUNTER — Encounter: Payer: Self-pay | Admitting: Physical Therapy

## 2016-06-07 DIAGNOSIS — R262 Difficulty in walking, not elsewhere classified: Secondary | ICD-10-CM

## 2016-06-07 DIAGNOSIS — M79605 Pain in left leg: Secondary | ICD-10-CM | POA: Diagnosis not present

## 2016-06-07 DIAGNOSIS — M7632 Iliotibial band syndrome, left leg: Secondary | ICD-10-CM | POA: Diagnosis not present

## 2016-06-07 NOTE — Therapy (Signed)
[place], [place], [address] Phone: [phone]   Fax:  [phone]  Physical Therapy Treatment  Patient Details  Name: [name] [name] MRN: [id number] Date of Birth: 06/10/1945 Referring Provider: Kathryne Hitch  Encounter Date: 06/07/2016      PT End of Session - 06/07/16 1217    Visit Number 7   Date for PT Re-Evaluation 07/20/16   PT Start Time 1145   PT Stop Time 1230   PT Time Calculation (min) 45 min   Activity Tolerance Patient tolerated treatment well   Behavior During Therapy [facility] for tasks assessed/performed      Past Medical History:  Diagnosis Date  . Acute bronchiolitis due to other infectious organisms   . Acute bronchiolitis due to respiratory syncytial virus (RSV)   . Arthritis    OA AND PAIN RIGHT KNEE  . Degeneration of intervertebral disc, site unspecified   . Disorder of bone and cartilage, unspecified   . Disturbance of skin sensation   . Encounter for long-term (current) use of other medications   . GERD (gastroesophageal reflux disease)    PT STATES SEVERE REFLUX IF SHE DOES NOT TAKE HER OMEPRAZOLE  . Headache(784.0)    WHEN REFLUX SEVERE  . Hypothyroidism   . Long term (current) use of anticoagulants   . Osteoarthrosis, unspecified whether generalized or localized, lower leg   . Osteoporosis, unspecified   . Other and unspecified hyperlipidemia   . Other atopic dermatitis and related conditions   . Other malaise and fatigue   . Other specified pre-operative examination 11/07/2011  . Thoracic or lumbosacral neuritis or radiculitis, unspecified   . Unspecified vitamin D deficiency   . Vegetarian diet     Past Surgical History:  Procedure Laterality Date  . CERVICAL BIOPSY  W/ LOOP ELECTRODE EXCISION    . EYE SURGERY  2016   Left eye, Cataract Removal  . TEETH EXTRACTIONS--NO OTHER SURGERY    . TOTAL KNEE ARTHROPLASTY  11/28/2011   Procedure: TOTAL KNEE  ARTHROPLASTY;  Surgeon: Tobi Bastos, MD;  Location: [facility];  Service: Orthopedics;  Laterality: Right;  . TUBAL LIGATION      There were no vitals filed for this visit.      Subjective Assessment - 06/07/16 1145    Subjective Pt reports that its only a little better maybe. Pt reports a running sensation at night when lying on her back straight   Currently in Pain? No/denies   Pain Score 0-No pain                         [facility] Adult PT Treatment/Exercise - 06/07/16 0001      Knee/Hip Exercises: Aerobic   Stationary Bike 36mn L 1    Nustep lvl 5 5 min      Moist Heat Therapy   Number Minutes Moist Heat 15 Minutes   Moist Heat Location Hip     Electrical Stimulation   Electrical Stimulation Location left ITB   Electrical Stimulation Action IFC   Electrical Stimulation Parameters pt sidelying   Electrical Stimulation Goals Pain     Manual Therapy   Manual Therapy Manual Traction;Passive ROM   Manual therapy comments Left hip flex,quad and IT   Soft tissue mobilization Left ITB stripping   Passive ROM L hip PROM   Manual Traction 2x10 sec L hip distration  PT Short Term Goals - 05/22/16 9470      PT SHORT TERM GOAL #1   Title independent with initial HEP   Time 2   Period Weeks   Status New           PT Long Term Goals - 06/05/16 1143      PT LONG TERM GOAL #1   Title decrease pain 50%   Status Partially Met     PT LONG TERM GOAL #2   Title increase ROM of the left knee to 0-116 degrees flexion               Plan - 06/07/16 1217    Clinical Impression Statement Today's session composed of MT only dust possible over use from being on her feet all most of the time. Positive response to MT.   Rehab Potential Good   PT Frequency 2x / week   PT Duration 8 weeks   PT Treatment/Interventions Cryotherapy;Electrical Stimulation;Iontophoresis 55m/ml Dexamethasone;Moist Heat;Ultrasound;Gait training;Functional  mobility training;Patient/family education;Balance training;Therapeutic exercise;Therapeutic activities;Manual techniques;Taping;Vasopneumatic Device   PT Next Visit Plan lat flexibility and strength      Patient will benefit from skilled therapeutic intervention in order to improve the following deficits and impairments:  Abnormal gait, Decreased mobility, Decreased range of motion, Decreased strength, Difficulty walking, Impaired flexibility, Pain  Visit Diagnosis: Pain in left leg  Iliotibial band syndrome of left side  Difficulty in walking, not elsewhere classified     Problem List Patient Active Problem List   Diagnosis Date Noted  . Muscle spasm of left lower extremity 05/11/2016  . Swelling of joint of left knee 05/11/2016  . Dysplasia of cervix, low grade (CIN 1) 05/11/2016  . High risk medication use 05/11/2016  . Thyroid activity decreased 11/19/2014  . Pain, joint, multiple sites 11/19/2014  . Numbness and tingling of left leg 04/06/2014  . Routine general medical examination at a health care facility 09/29/2013  . Osteoporosis, post-menopausal 04/21/2013  . Cough variant asthma 03/17/2013  . Psoriasis 03/17/2013  . Persistent dry cough 03/17/2013  . Osteoarthritis of left knee 03/03/2013  . Psoriasis-like skin disease 03/03/2013  . Eczema 01/07/2013  . Need for prophylactic vaccination and inoculation against influenza 01/07/2013  . Prediabetes 12/17/2012  . Cough 12/17/2012  . Reactive airway disease with wheezing 12/03/2012  . URI, acute 12/03/2012  . Hypothyroidism 10/29/2012  . Unspecified vitamin D deficiency 10/29/2012  . Hyperlipidemia LDL goal <130 10/29/2012  . GERD (gastroesophageal reflux disease) 10/29/2012  . Osteoporosis 10/29/2012  . Myalgia and myositis 10/29/2012    RScot Jun PTA 06/07/2016, 12:19 PM  [address] [address]Phone:  [phone]  Fax:  [phone] Name: SSEVILLE BRICKMRN: [id number] of Birth: 306-21-1947

## 2016-06-13 DIAGNOSIS — Z96651 Presence of right artificial knee joint: Secondary | ICD-10-CM | POA: Diagnosis not present

## 2016-06-13 DIAGNOSIS — M545 Low back pain: Secondary | ICD-10-CM | POA: Diagnosis not present

## 2016-06-15 ENCOUNTER — Ambulatory Visit: Payer: Medicare Other

## 2016-06-20 ENCOUNTER — Ambulatory Visit: Payer: Medicare Other

## 2016-06-20 ENCOUNTER — Other Ambulatory Visit: Payer: Medicare Other

## 2016-06-20 DIAGNOSIS — E034 Atrophy of thyroid (acquired): Secondary | ICD-10-CM | POA: Diagnosis not present

## 2016-06-20 LAB — TSH: TSH: 2.66 mIU/L

## 2016-06-25 ENCOUNTER — Ambulatory Visit: Payer: Medicare Other | Admitting: Physical Therapy

## 2016-06-25 ENCOUNTER — Encounter: Payer: Self-pay | Admitting: Physical Therapy

## 2016-06-25 DIAGNOSIS — M7632 Iliotibial band syndrome, left leg: Secondary | ICD-10-CM | POA: Diagnosis not present

## 2016-06-25 DIAGNOSIS — M79605 Pain in left leg: Secondary | ICD-10-CM | POA: Diagnosis not present

## 2016-06-25 DIAGNOSIS — R262 Difficulty in walking, not elsewhere classified: Secondary | ICD-10-CM | POA: Diagnosis not present

## 2016-06-25 NOTE — Therapy (Signed)
[place], [place], [address] Phone: [phone]   Fax:  [phone]  Physical Therapy Treatment  Patient Details  Name: [name] [name] MRN: [id number] Date of Birth: 1945-11-22 Referring Provider: Kathryne Hitch  Encounter Date: 06/25/2016      PT End of Session - 06/25/16 1142    Visit Number 8   Date for PT Re-Evaluation 07/20/16   PT Start Time 1100   PT Stop Time 1150   PT Time Calculation (min) 50 min   Activity Tolerance Patient tolerated treatment well   Behavior During Therapy [facility] for tasks assessed/performed      Past Medical History:  Diagnosis Date  . Acute bronchiolitis due to other infectious organisms   . Acute bronchiolitis due to respiratory syncytial virus (RSV)   . Arthritis    OA AND PAIN RIGHT KNEE  . Degeneration of intervertebral disc, site unspecified   . Disorder of bone and cartilage, unspecified   . Disturbance of skin sensation   . Encounter for long-term (current) use of other medications   . GERD (gastroesophageal reflux disease)    PT STATES SEVERE REFLUX IF SHE DOES NOT TAKE HER OMEPRAZOLE  . Headache(784.0)    WHEN REFLUX SEVERE  . Hypothyroidism   . Long term (current) use of anticoagulants   . Osteoarthrosis, unspecified whether generalized or localized, lower leg   . Osteoporosis, unspecified   . Other and unspecified hyperlipidemia   . Other atopic dermatitis and related conditions   . Other malaise and fatigue   . Other specified pre-operative examination 11/07/2011  . Thoracic or lumbosacral neuritis or radiculitis, unspecified   . Unspecified vitamin D deficiency   . Vegetarian diet     Past Surgical History:  Procedure Laterality Date  . CERVICAL BIOPSY  W/ LOOP ELECTRODE EXCISION    . EYE SURGERY  2016   Left eye, Cataract Removal  . TEETH EXTRACTIONS--NO OTHER SURGERY    . TOTAL KNEE ARTHROPLASTY  11/28/2011   Procedure: TOTAL KNEE  ARTHROPLASTY;  Surgeon: Tobi Bastos, MD;  Location: [facility];  Service: Orthopedics;  Laterality: Right;  . TUBAL LIGATION      There were no vitals filed for this visit.      Subjective Assessment - 06/25/16 1059    Subjective Pt continues to reports a tingling sensation on lateral L thigh, Pt reports going to MD, MD indicated that it could be coming from her back, and was given a new PT order to treat low back.   Currently in Pain? No/denies   Pain Score 0-No pain                         [facility] Adult PT Treatment/Exercise - 06/25/16 0001      Exercises   Exercises Lumbar     Lumbar Exercises: Stretches   Lower Trunk Rotation 3 reps;10 seconds  running gown LLE going R   ITB Stretch 3 reps;10 seconds     Lumbar Exercises: Machines for Strengthening   Stationary Bike 41mn L 1      Lumbar Exercises: Standing   Row 10 reps;Theraband;Both  x2   Theraband Level (Row) Level 2 (Red)   Shoulder Extension Both;10 reps;Theraband  x2   Theraband Level (Shoulder Extension) Level 2 (Red)   Other Standing Lumbar Exercises Overhead Ext & trunk rotations with red ball 2x10    Other Standing Lumbar Exercises Hip  Ext X15 each      Lumbar Exercises: Supine   Bridge 10 reps;2 seconds  x2     Knee/Hip Exercises: Aerobic   Nustep lvl 5 5 min      Modalities   Modalities Moist Heat     Moist Heat Therapy   Number Minutes Moist Heat 10 Minutes   Moist Heat Location Hip;Knee                  PT Short Term Goals - 05/22/16 5638      PT SHORT TERM GOAL #1   Title independent with initial HEP   Time 2   Period Weeks   Status New           PT Long Term Goals - 06/25/16 1145      PT LONG TERM GOAL #1   Title decrease pain 50%   Status Partially Met     PT LONG TERM GOAL #2   Title increase ROM of the left knee to 0-116 degrees flexion   Status Partially Met               Plan - 06/25/16 1142    Clinical Impression Statement Pt  continues to feel a running sensation down her lateral L thigh at night when lying flat. Pt returns from MD with new order to treat low back, to see if that is the cause. Pt completed all of today's exercises well, does report running sensation while hook lying and rotating to R. Pt does reports some R knee pain from previous surgery.   Rehab Potential Good   PT Frequency 2x / week   PT Duration 8 weeks   PT Treatment/Interventions Cryotherapy;Electrical Stimulation;Iontophoresis 16m/ml Dexamethasone;Moist Heat;Ultrasound;Gait training;Functional mobility training;Patient/family education;Balance training;Therapeutic exercise;Therapeutic activities;Manual techniques;Taping;Vasopneumatic Device   PT Next Visit Plan assess TX      Patient will benefit from skilled therapeutic intervention in order to improve the following deficits and impairments:  Abnormal gait, Decreased mobility, Decreased range of motion, Decreased strength, Difficulty walking, Impaired flexibility, Pain  Visit Diagnosis: Pain in left leg  Iliotibial band syndrome of left side  Difficulty in walking, not elsewhere classified     Problem List Patient Active Problem List   Diagnosis Date Noted  . Muscle spasm of left lower extremity 05/11/2016  . Swelling of joint of left knee 05/11/2016  . Dysplasia of cervix, low grade (CIN 1) 05/11/2016  . High risk medication use 05/11/2016  . Thyroid activity decreased 11/19/2014  . Pain, joint, multiple sites 11/19/2014  . Numbness and tingling of left leg 04/06/2014  . Routine general medical examination at a health care facility 09/29/2013  . Osteoporosis, post-menopausal 04/21/2013  . Cough variant asthma 03/17/2013  . Psoriasis 03/17/2013  . Persistent dry cough 03/17/2013  . Osteoarthritis of left knee 03/03/2013  . Psoriasis-like skin disease 03/03/2013  . Eczema 01/07/2013  . Need for prophylactic vaccination and inoculation against influenza 01/07/2013  .  Prediabetes 12/17/2012  . Cough 12/17/2012  . Reactive airway disease with wheezing 12/03/2012  . URI, acute 12/03/2012  . Hypothyroidism 10/29/2012  . Unspecified vitamin D deficiency 10/29/2012  . Hyperlipidemia LDL goal <130 10/29/2012  . GERD (gastroesophageal reflux disease) 10/29/2012  . Osteoporosis 10/29/2012  . Myalgia and myositis 10/29/2012    RScot Jun PTA 06/25/2016, 11:46 AM  [address] [address]Phone: [phone]  Fax:  [phone] Name:  Evanny LINDALEE HUIZINGA MRN: [id number] Date of Birth: Sep 23, 1945

## 2016-06-27 ENCOUNTER — Ambulatory Visit: Payer: Medicare Other | Admitting: Physical Therapy

## 2016-06-27 ENCOUNTER — Encounter: Payer: Self-pay | Admitting: Physical Therapy

## 2016-06-27 DIAGNOSIS — M7632 Iliotibial band syndrome, left leg: Secondary | ICD-10-CM | POA: Diagnosis not present

## 2016-06-27 DIAGNOSIS — M79605 Pain in left leg: Secondary | ICD-10-CM

## 2016-06-27 DIAGNOSIS — R262 Difficulty in walking, not elsewhere classified: Secondary | ICD-10-CM

## 2016-06-27 NOTE — Therapy (Signed)
[place], [place], [address] Phone: [phone]   Fax:  [phone]  Physical Therapy Treatment  Patient Details  Name: [name] [name] MRN: [id number] Date of Birth: 04/24/1946 Referring Provider: Kathryne Hitch  Encounter Date: 06/27/2016      PT End of Session - 06/27/16 1149    Visit Number 9   Date for PT Re-Evaluation 07/20/16   PT Start Time 1100   PT Stop Time 1155   PT Time Calculation (min) 55 min   Activity Tolerance Patient tolerated treatment well   Behavior During Therapy [facility] for tasks assessed/performed      Past Medical History:  Diagnosis Date  . Acute bronchiolitis due to other infectious organisms   . Acute bronchiolitis due to respiratory syncytial virus (RSV)   . Arthritis    OA AND PAIN RIGHT KNEE  . Degeneration of intervertebral disc, site unspecified   . Disorder of bone and cartilage, unspecified   . Disturbance of skin sensation   . Encounter for long-term (current) use of other medications   . GERD (gastroesophageal reflux disease)    PT STATES SEVERE REFLUX IF SHE DOES NOT TAKE HER OMEPRAZOLE  . Headache(784.0)    WHEN REFLUX SEVERE  . Hypothyroidism   . Long term (current) use of anticoagulants   . Osteoarthrosis, unspecified whether generalized or localized, lower leg   . Osteoporosis, unspecified   . Other and unspecified hyperlipidemia   . Other atopic dermatitis and related conditions   . Other malaise and fatigue   . Other specified pre-operative examination 11/07/2011  . Thoracic or lumbosacral neuritis or radiculitis, unspecified   . Unspecified vitamin D deficiency   . Vegetarian diet     Past Surgical History:  Procedure Laterality Date  . CERVICAL BIOPSY  W/ LOOP ELECTRODE EXCISION    . EYE SURGERY  2016   Left eye, Cataract Removal  . TEETH EXTRACTIONS--NO OTHER SURGERY    . TOTAL KNEE ARTHROPLASTY  11/28/2011   Procedure: TOTAL KNEE  ARTHROPLASTY;  Surgeon: Tobi Bastos, MD;  Location: [facility];  Service: Orthopedics;  Laterality: Right;  . TUBAL LIGATION      There were no vitals filed for this visit.      Subjective Assessment - 06/27/16 1102    Subjective Pt continues to reports a running sensation down her lateral L thigh at night when she is lying flat on her back   Currently in Pain? No/denies   Pain Score 0-No pain                         [facility] Adult PT Treatment/Exercise - 06/27/16 0001      Lumbar Exercises: Machines for Strengthening   Cybex Knee Flexion 20lb 2x10   Other Lumbar Machine Exercise Rows & Lats 15lb 2x10     Lumbar Exercises: Supine   Bridge 10 reps;2 seconds     Knee/Hip Exercises: Aerobic   Stationary Bike 27mn L 1    Nustep lvl 5 5 min      Modalities   Modalities Moist Heat     Moist Heat Therapy   Number Minutes Moist Heat 10 Minutes   Moist Heat Location Hip;Knee     Manual Therapy   Manual Therapy Soft tissue mobilization   Manual therapy comments Left hip flex,quad and IT   Soft tissue mobilization STM ITB while pt supine, relieved symptoms  Passive ROM L hip PROM                  PT Short Term Goals - 05/22/16 3545      PT SHORT TERM GOAL #1   Title independent with initial HEP   Time 2   Period Weeks   Status New           PT Long Term Goals - 06/27/16 1152      PT LONG TERM GOAL #1   Title decrease pain 50%   Status Partially Met               Plan - 06/27/16 1150    Clinical Impression Statement Pt reports  a running sensation in her lateral L thigh, symptoms relieved with STM to the area. Attempted multiple positions on pat table, pt only reports symptoms while laying supine and prone with LE flat. Occasional symptoms while hook lying, but very inconsistent. No issues with stabilization exercises. Pt instructed to perform ab sets at home.   Rehab Potential Good   PT Frequency 2x / week   PT Duration 8 weeks    PT Treatment/Interventions Cryotherapy;Electrical Stimulation;Iontophoresis 73m/ml Dexamethasone;Moist Heat;Ultrasound;Gait training;Functional mobility training;Patient/family education;Balance training;Therapeutic exercise;Therapeutic activities;Manual techniques;Taping;Vasopneumatic Device   PT Next Visit Plan assess TX, pelvis stabilization      Patient will benefit from skilled therapeutic intervention in order to improve the following deficits and impairments:  Abnormal gait, Decreased mobility, Decreased range of motion, Decreased strength, Difficulty walking, Impaired flexibility, Pain  Visit Diagnosis: Pain in left leg  Iliotibial band syndrome of left side  Difficulty in walking, not elsewhere classified     Problem List Patient Active Problem List   Diagnosis Date Noted  . Muscle spasm of left lower extremity 05/11/2016  . Swelling of joint of left knee 05/11/2016  . Dysplasia of cervix, low grade (CIN 1) 05/11/2016  . High risk medication use 05/11/2016  . Thyroid activity decreased 11/19/2014  . Pain, joint, multiple sites 11/19/2014  . Numbness and tingling of left leg 04/06/2014  . Routine general medical examination at a health care facility 09/29/2013  . Osteoporosis, post-menopausal 04/21/2013  . Cough variant asthma 03/17/2013  . Psoriasis 03/17/2013  . Persistent dry cough 03/17/2013  . Osteoarthritis of left knee 03/03/2013  . Psoriasis-like skin disease 03/03/2013  . Eczema 01/07/2013  . Need for prophylactic vaccination and inoculation against influenza 01/07/2013  . Prediabetes 12/17/2012  . Cough 12/17/2012  . Reactive airway disease with wheezing 12/03/2012  . URI, acute 12/03/2012  . Hypothyroidism 10/29/2012  . Unspecified vitamin D deficiency 10/29/2012  . Hyperlipidemia LDL goal <130 10/29/2012  . GERD (gastroesophageal reflux disease) 10/29/2012  . Osteoporosis 10/29/2012  . Myalgia and myositis 10/29/2012    RScot Jun2/21/2018, 11:53 AM  [address] [address]Phone: [phone]  Fax:  [phone] Name: [name] POTOCKIMRN: [id number] of Birth: 3September 04, 1947

## 2016-06-29 ENCOUNTER — Ambulatory Visit: Payer: Medicare Other | Admitting: Physical Therapy

## 2016-06-29 ENCOUNTER — Encounter: Payer: Self-pay | Admitting: Physical Therapy

## 2016-06-29 DIAGNOSIS — M79605 Pain in left leg: Secondary | ICD-10-CM

## 2016-06-29 DIAGNOSIS — M7632 Iliotibial band syndrome, left leg: Secondary | ICD-10-CM | POA: Diagnosis not present

## 2016-06-29 DIAGNOSIS — R262 Difficulty in walking, not elsewhere classified: Secondary | ICD-10-CM | POA: Diagnosis not present

## 2016-06-29 NOTE — Therapy (Signed)
[place], [place], [address] Phone: [phone]   Fax:  [phone]  Physical Therapy Treatment  Patient Details  Name: [name] [name] MRN: [id number] Date of Birth: 25-Jun-1945 Referring Provider: Kathryne Hitch  Encounter Date: 06/29/2016      PT End of Session - 06/29/16 1141    Visit Number 10   PT Start Time 1100   PT Stop Time 1144   PT Time Calculation (min) 44 min   Activity Tolerance Patient tolerated treatment well   Behavior During Therapy [facility] for tasks assessed/performed      Past Medical History:  Diagnosis Date  . Acute bronchiolitis due to other infectious organisms   . Acute bronchiolitis due to respiratory syncytial virus (RSV)   . Arthritis    OA AND PAIN RIGHT KNEE  . Degeneration of intervertebral disc, site unspecified   . Disorder of bone and cartilage, unspecified   . Disturbance of skin sensation   . Encounter for long-term (current) use of other medications   . GERD (gastroesophageal reflux disease)    PT STATES SEVERE REFLUX IF SHE DOES NOT TAKE HER OMEPRAZOLE  . Headache(784.0)    WHEN REFLUX SEVERE  . Hypothyroidism   . Long term (current) use of anticoagulants   . Osteoarthrosis, unspecified whether generalized or localized, lower leg   . Osteoporosis, unspecified   . Other and unspecified hyperlipidemia   . Other atopic dermatitis and related conditions   . Other malaise and fatigue   . Other specified pre-operative examination 11/07/2011  . Thoracic or lumbosacral neuritis or radiculitis, unspecified   . Unspecified vitamin D deficiency   . Vegetarian diet     Past Surgical History:  Procedure Laterality Date  . CERVICAL BIOPSY  W/ LOOP ELECTRODE EXCISION    . EYE SURGERY  2016   Left eye, Cataract Removal  . TEETH EXTRACTIONS--NO OTHER SURGERY    . TOTAL KNEE ARTHROPLASTY  11/28/2011   Procedure: TOTAL KNEE ARTHROPLASTY;  Surgeon: Tobi Bastos, MD;  Location: [facility];  Service: Orthopedics;  Laterality: Right;  . TUBAL LIGATION      There were no vitals filed for this visit.      Subjective Assessment - 06/29/16 1059    Subjective Pt reports that she continues to feel a running sensation gown ger L thigh at night when she lays straight, she reports turning on her side makes the pain go away. Pt reports that she sleeps all night but don't know the position.   Currently in Pain? No/denies   Pain Score 0-No pain                         [facility] Adult PT Treatment/Exercise - 06/29/16 0001      Lumbar Exercises: Machines for Strengthening   Other Lumbar Machine Exercise Rows & Lats 15lb 2x10     Lumbar Exercises: Supine   Ab Set 10 reps;3 seconds   Clam 15 reps;3 seconds   Bridge 10 reps;2 seconds   Straight Leg Raise 10 reps;3 seconds     Knee/Hip Exercises: Aerobic   Stationary Bike 53mn L 1    Nustep lvl 5 6 min      Knee/Hip Exercises: Sidelying   Hip ABduction 2 sets;10 reps   Hip ABduction Limitations 2     Manual Therapy   Manual Therapy Soft tissue mobilization;Taping   Soft tissue mobilization STM ITB  while pt supine, relieved symptoms    [employer]                  PT Short Term Goals - 05/22/16 0829      PT SHORT TERM GOAL #1   Title independent with initial HEP   Time 2   Period Weeks   Status New           PT Long Term Goals - 06/27/16 1152      PT LONG TERM GOAL #1   Title decrease pain 50%   Status Partially Met               Plan - 06/29/16 1142    Clinical Impression Statement Pt tolerated all of today's interventions well. Pt does have some fatigue with side lying hip abduction. Applied K tape to L IT band to see it helps relieve pt symptoms while laying down.   Rehab Potential Good   PT Frequency 2x / week   PT Duration 8 weeks   PT Treatment/Interventions Cryotherapy;Electrical Stimulation;Iontophoresis 82m/ml  Dexamethasone;Moist Heat;Ultrasound;Gait training;Functional mobility training;Patient/family education;Balance training;Therapeutic exercise;Therapeutic activities;Manual techniques;Taping;Vasopneumatic Device   PT Next Visit Plan assess TX, pelvis stabilization      Patient will benefit from skilled therapeutic intervention in order to improve the following deficits and impairments:  Abnormal gait, Decreased mobility, Decreased range of motion, Decreased strength, Difficulty walking, Impaired flexibility, Pain  Visit Diagnosis: Pain in left leg  Iliotibial band syndrome of left side     Problem List Patient Active Problem List   Diagnosis Date Noted  . Muscle spasm of left lower extremity 05/11/2016  . Swelling of joint of left knee 05/11/2016  . Dysplasia of cervix, low grade (CIN 1) 05/11/2016  . High risk medication use 05/11/2016  . Thyroid activity decreased 11/19/2014  . Pain, joint, multiple sites 11/19/2014  . Numbness and tingling of left leg 04/06/2014  . Routine general medical examination at a health care facility 09/29/2013  . Osteoporosis, post-menopausal 04/21/2013  . Cough variant asthma 03/17/2013  . Psoriasis 03/17/2013  . Persistent dry cough 03/17/2013  . Osteoarthritis of left knee 03/03/2013  . Psoriasis-like skin disease 03/03/2013  . Eczema 01/07/2013  . Need for prophylactic vaccination and inoculation against influenza 01/07/2013  . Prediabetes 12/17/2012  . Cough 12/17/2012  . Reactive airway disease with wheezing 12/03/2012  . URI, acute 12/03/2012  . Hypothyroidism 10/29/2012  . Unspecified vitamin D deficiency 10/29/2012  . Hyperlipidemia LDL goal <130 10/29/2012  . GERD (gastroesophageal reflux disease) 10/29/2012  . Osteoporosis 10/29/2012  . Myalgia and myositis 10/29/2012    RScot Jun2/23/2018, 11:46 AM  [address] [place]  [address]Phone: [phone]  Fax:  [phone] Name: [name] MCGRADYMRN: [id number] of Birth: [birth year]

## 2016-07-03 ENCOUNTER — Ambulatory Visit: Payer: Medicare Other | Admitting: Physical Therapy

## 2016-07-03 DIAGNOSIS — M79605 Pain in left leg: Secondary | ICD-10-CM | POA: Diagnosis not present

## 2016-07-03 DIAGNOSIS — M7632 Iliotibial band syndrome, left leg: Secondary | ICD-10-CM | POA: Diagnosis not present

## 2016-07-03 DIAGNOSIS — R262 Difficulty in walking, not elsewhere classified: Secondary | ICD-10-CM | POA: Diagnosis not present

## 2016-07-03 NOTE — Therapy (Signed)
[place], [place], [address] Phone: [phone]   Fax:  [phone]  Physical Therapy Treatment  Patient Details  Name: [name] [name] MRN: [id number] Date of Birth: [birth year] Referring Provider: Kathryne Hitch  Encounter Date: 07/03/2016      PT End of Session - 07/03/16 0759    Visit Number 11   Date for PT Re-Evaluation 07/20/16   PT Start Time 0757   PT Stop Time 0838   PT Time Calculation (min) 41 min   Activity Tolerance Patient tolerated treatment well   Behavior During Therapy [facility] for tasks assessed/performed      Past Medical History:  Diagnosis Date  . Acute bronchiolitis due to other infectious organisms   . Acute bronchiolitis due to respiratory syncytial virus (RSV)   . Arthritis    OA AND PAIN RIGHT KNEE  . Degeneration of intervertebral disc, site unspecified   . Disorder of bone and cartilage, unspecified   . Disturbance of skin sensation   . Encounter for long-term (current) use of other medications   . GERD (gastroesophageal reflux disease)    PT STATES SEVERE REFLUX IF SHE DOES NOT TAKE HER OMEPRAZOLE  . Headache(784.0)    WHEN REFLUX SEVERE  . Hypothyroidism   . Long term (current) use of anticoagulants   . Osteoarthrosis, unspecified whether generalized or localized, lower leg   . Osteoporosis, unspecified   . Other and unspecified hyperlipidemia   . Other atopic dermatitis and related conditions   . Other malaise and fatigue   . Other specified pre-operative examination 11/07/2011  . Thoracic or lumbosacral neuritis or radiculitis, unspecified   . Unspecified vitamin D deficiency   . Vegetarian diet     Past Surgical History:  Procedure Laterality Date  . CERVICAL BIOPSY  W/ LOOP ELECTRODE EXCISION    . EYE SURGERY  2016   Left eye, Cataract Removal  . TEETH EXTRACTIONS--NO OTHER SURGERY    . TOTAL KNEE ARTHROPLASTY  11/28/2011   Procedure: TOTAL KNEE  ARTHROPLASTY;  Surgeon: Tobi Bastos, MD;  Location: [facility];  Service: Orthopedics;  Laterality: Right;  . TUBAL LIGATION      There were no vitals filed for this visit.      Subjective Assessment - 07/03/16 0758    Subjective "no change" - taping didn't help - "next day tape came off"   Limitations Standing;Walking;House hold activities   Patient Stated Goals less pain, better motions   Currently in Pain? No/denies   Pain Score 0-No pain                         [facility] Adult PT Treatment/Exercise - 07/03/16 0800      Lumbar Exercises: Standing   Row 15 reps;Theraband   Theraband Level (Row) Level 3 (Green)   Other Standing Lumbar Exercises oblique press x 10 each side - green tband     Lumbar Exercises: Supine   Ab Set 10 reps;3 seconds   AB Set Limitations progression to alternating LE marches with ab set - 10 reps   Clam 15 reps;3 seconds   Clam Limitations B LE - red tband at knees - ab set prior to movement   Bridge 15 reps;3 seconds   Bridge Limitations red tband at knees   Straight Leg Raise 15 reps   Straight Leg Raises Limitations 2#   Isometric Hip Flexion 10 reps;5 seconds  Knee/Hip Exercises: Aerobic   Nustep level 5 x 7 minutes     Knee/Hip Exercises: Sidelying   Hip ABduction 15 reps   Hip ABduction Limitations 2#                  PT Short Term Goals - 05/22/16 0829      PT SHORT TERM GOAL #1   Title independent with initial HEP   Time 2   Period Weeks   Status New           PT Long Term Goals - 06/27/16 1152      PT LONG TERM GOAL #1   Title decrease pain 50%   Status Partially Met               Plan - 07/03/16 0759    Clinical Impression Statement Patient with continued subjective reports of no change in symptoms. Patient tolerating progression of all core stabilization with required VC for form. Most difficulty today with resisted SLR requiring frequent rest breaks.    PT Treatment/Interventions  Cryotherapy;Electrical Stimulation;Iontophoresis 62m/ml Dexamethasone;Moist Heat;Ultrasound;Gait training;Functional mobility training;Patient/family education;Balance training;Therapeutic exercise;Therapeutic activities;Manual techniques;Taping;Vasopneumatic Device   PT Next Visit Plan assess TX, pelvis stabilization   Consulted and Agree with Plan of Care Patient      Patient will benefit from skilled therapeutic intervention in order to improve the following deficits and impairments:  Abnormal gait, Decreased mobility, Decreased range of motion, Decreased strength, Difficulty walking, Impaired flexibility, Pain  Visit Diagnosis: Pain in left leg  Iliotibial band syndrome of left side     Problem List Patient Active Problem List   Diagnosis Date Noted  . Muscle spasm of left lower extremity 05/11/2016  . Swelling of joint of left knee 05/11/2016  . Dysplasia of cervix, low grade (CIN 1) 05/11/2016  . High risk medication use 05/11/2016  . Thyroid activity decreased 11/19/2014  . Pain, joint, multiple sites 11/19/2014  . Numbness and tingling of left leg 04/06/2014  . Routine general medical examination at a health care facility 09/29/2013  . Osteoporosis, post-menopausal 04/21/2013  . Cough variant asthma 03/17/2013  . Psoriasis 03/17/2013  . Persistent dry cough 03/17/2013  . Osteoarthritis of left knee 03/03/2013  . Psoriasis-like skin disease 03/03/2013  . Eczema 01/07/2013  . Need for prophylactic vaccination and inoculation against influenza 01/07/2013  . Prediabetes 12/17/2012  . Cough 12/17/2012  . Reactive airway disease with wheezing 12/03/2012  . URI, acute 12/03/2012  . Hypothyroidism 10/29/2012  . Unspecified vitamin D deficiency 10/29/2012  . Hyperlipidemia LDL goal <130 10/29/2012  . GERD (gastroesophageal reflux disease) 10/29/2012  . Osteoporosis 10/29/2012  . Myalgia and myositis 10/29/2012    SLanney Gins PT, DPT 07/03/16 8:49 AM   [place] [address] [address]Phone: [phone]  Fax:  [phone] Name: [name] LOREMRN: [id number] of Birth: 307-27-1947

## 2016-07-04 ENCOUNTER — Encounter: Payer: Self-pay | Admitting: Physical Therapy

## 2016-07-04 ENCOUNTER — Ambulatory Visit: Payer: Medicare Other | Admitting: Physical Therapy

## 2016-07-04 DIAGNOSIS — R262 Difficulty in walking, not elsewhere classified: Secondary | ICD-10-CM | POA: Diagnosis not present

## 2016-07-04 DIAGNOSIS — M7632 Iliotibial band syndrome, left leg: Secondary | ICD-10-CM | POA: Diagnosis not present

## 2016-07-04 DIAGNOSIS — M79605 Pain in left leg: Secondary | ICD-10-CM | POA: Diagnosis not present

## 2016-07-04 NOTE — Therapy (Signed)
[place], [place], [address] Phone: [phone]   Fax:  [phone]  Physical Therapy Treatment  Patient Details  Name: [name] [name] MRN: [id number] Date of Birth: 08/10/1945 Referring Provider: Kathryne Hitch  Encounter Date: 07/04/2016      PT End of Session - 07/04/16 1646    Visit Number 12   Date for PT Re-Evaluation 07/20/16   PT Start Time 1600   PT Stop Time 1654   PT Time Calculation (min) 54 min   Activity Tolerance Patient tolerated treatment well   Behavior During Therapy [facility] for tasks assessed/performed      Past Medical History:  Diagnosis Date  . Acute bronchiolitis due to other infectious organisms   . Acute bronchiolitis due to respiratory syncytial virus (RSV)   . Arthritis    OA AND PAIN RIGHT KNEE  . Degeneration of intervertebral disc, site unspecified   . Disorder of bone and cartilage, unspecified   . Disturbance of skin sensation   . Encounter for long-term (current) use of other medications   . GERD (gastroesophageal reflux disease)    PT STATES SEVERE REFLUX IF SHE DOES NOT TAKE HER OMEPRAZOLE  . Headache(784.0)    WHEN REFLUX SEVERE  . Hypothyroidism   . Long term (current) use of anticoagulants   . Osteoarthrosis, unspecified whether generalized or localized, lower leg   . Osteoporosis, unspecified   . Other and unspecified hyperlipidemia   . Other atopic dermatitis and related conditions   . Other malaise and fatigue   . Other specified pre-operative examination 11/07/2011  . Thoracic or lumbosacral neuritis or radiculitis, unspecified   . Unspecified vitamin D deficiency   . Vegetarian diet     Past Surgical History:  Procedure Laterality Date  . CERVICAL BIOPSY  W/ LOOP ELECTRODE EXCISION    . EYE SURGERY  2016   Left eye, Cataract Removal  . TEETH EXTRACTIONS--NO OTHER SURGERY    . TOTAL KNEE ARTHROPLASTY  11/28/2011   Procedure: TOTAL KNEE  ARTHROPLASTY;  Surgeon: Tobi Bastos, MD;  Location: [facility];  Service: Orthopedics;  Laterality: Right;  . TUBAL LIGATION      There were no vitals filed for this visit.      Subjective Assessment - 07/04/16 1558    Subjective Pt reports no change, Pt also reports a burning sensation down her L lateral thigh as well.   Patient is accompained by: Family member   Currently in Pain? No/denies   Pain Score 0-No pain                         [facility] Adult PT Treatment/Exercise - 07/04/16 0001      Lumbar Exercises: Stretches   Passive Hamstring Stretch 3 reps;10 seconds   ITB Stretch 3 reps;10 seconds   Piriformis Stretch 3 reps;10 seconds     Lumbar Exercises: Machines for Strengthening   Other Lumbar Machine Exercise Rows & Lats 15lb 2x10     Lumbar Exercises: Supine   Ab Set 10 reps;3 seconds   AB Set Limitations progression to alternating LE marches with ab set - 10 reps   Other Supine Lumbar Exercises Ball squeeze and bridges 2x10      Knee/Hip Exercises: Aerobic   Stationary Bike 30mn L 1    Nustep level 5 x 6 minutes     Knee/Hip Exercises: Standing   Walking with Sports Cord 20lb  side stepping x5 each way      Knee/Hip Exercises: Supine   Other Supine Knee/Hip Exercises hip abd/add 2lb x10   Other Supine Knee/Hip Exercises Bilat SLR x10     Modalities   Modalities Moist Heat     Moist Heat Therapy   Number Minutes Moist Heat 10 Minutes   Moist Heat Location Hip;Knee     Manual Therapy   Manual Therapy Soft tissue mobilization   Soft tissue mobilization STM ITB while pt supine, relieved symptoms                   PT Short Term Goals - 05/22/16 3704      PT SHORT TERM GOAL #1   Title independent with initial HEP   Time 2   Period Weeks   Status New           PT Long Term Goals - 07/04/16 1647      PT LONG TERM GOAL #1   Title decrease pain 50%   Status Partially Met     PT LONG TERM GOAL #2   Title increase ROM of  the left knee to 0-116 degrees flexion   Status Partially Met     PT LONG TERM GOAL #3   Title go up and down stairs step over step               Plan - 07/04/16 1647    Clinical Impression Statement Pt with continues subjective reports of a running sensation down her lateral L thigh when laying flat on her back. Pt does get a cramp in R HS with bridges. LLE fatigues quick with abd/add. some instability with lateral side step.   Rehab Potential Good   PT Frequency 2x / week   PT Duration 8 weeks   PT Treatment/Interventions Cryotherapy;Electrical Stimulation;Iontophoresis 66m/ml Dexamethasone;Moist Heat;Ultrasound;Gait training;Functional mobility training;Patient/family education;Balance training;Therapeutic exercise;Therapeutic activities;Manual techniques;Taping;Vasopneumatic Device   PT Next Visit Plan assess TX, pelvis stabilization      Patient will benefit from skilled therapeutic intervention in order to improve the following deficits and impairments:  Abnormal gait, Decreased mobility, Decreased range of motion, Decreased strength, Difficulty walking, Impaired flexibility, Pain  Visit Diagnosis: Pain in left leg     Problem List Patient Active Problem List   Diagnosis Date Noted  . Muscle spasm of left lower extremity 05/11/2016  . Swelling of joint of left knee 05/11/2016  . Dysplasia of cervix, low grade (CIN 1) 05/11/2016  . High risk medication use 05/11/2016  . Thyroid activity decreased 11/19/2014  . Pain, joint, multiple sites 11/19/2014  . Numbness and tingling of left leg 04/06/2014  . Routine general medical examination at a health care facility 09/29/2013  . Osteoporosis, post-menopausal 04/21/2013  . Cough variant asthma 03/17/2013  . Psoriasis 03/17/2013  . Persistent dry cough 03/17/2013  . Osteoarthritis of left knee 03/03/2013  . Psoriasis-like skin disease 03/03/2013  . Eczema 01/07/2013  . Need for prophylactic vaccination and inoculation  against influenza 01/07/2013  . Prediabetes 12/17/2012  . Cough 12/17/2012  . Reactive airway disease with wheezing 12/03/2012  . URI, acute 12/03/2012  . Hypothyroidism 10/29/2012  . Unspecified vitamin D deficiency 10/29/2012  . Hyperlipidemia LDL goal <130 10/29/2012  . GERD (gastroesophageal reflux disease) 10/29/2012  . Osteoporosis 10/29/2012  . Myalgia and myositis 10/29/2012    RScot Jun2/28/2018, 4:50 PM  [place] [address]  Phone: [phone]   Fax:  [phone]  Name: [name] [name] MRN: [id number] Date of Birth: 02-Jul-1945

## 2016-07-09 ENCOUNTER — Ambulatory Visit: Payer: Medicare Other | Attending: Orthopedic Surgery | Admitting: Physical Therapy

## 2016-07-09 ENCOUNTER — Encounter: Payer: Self-pay | Admitting: Physical Therapy

## 2016-07-09 DIAGNOSIS — M7632 Iliotibial band syndrome, left leg: Secondary | ICD-10-CM | POA: Insufficient documentation

## 2016-07-09 DIAGNOSIS — M25552 Pain in left hip: Secondary | ICD-10-CM | POA: Diagnosis not present

## 2016-07-09 DIAGNOSIS — M79605 Pain in left leg: Secondary | ICD-10-CM | POA: Insufficient documentation

## 2016-07-09 NOTE — Therapy (Signed)
[place], [place], [address] Phone: [phone]   Fax:  [phone]  Physical Therapy Treatment  Patient Details  Name: [name] [name] MRN: [id number] Date of Birth: [birth year] Referring Provider: Kathryne Hitch  Encounter Date: 07/09/2016      PT End of Session - 07/09/16 1143    Visit Number 13   Date for PT Re-Evaluation 07/20/16   PT Start Time 1100   PT Stop Time 1143   PT Time Calculation (min) 43 min   Activity Tolerance Patient tolerated treatment well   Behavior During Therapy [facility] for tasks assessed/performed      Past Medical History:  Diagnosis Date  . Acute bronchiolitis due to other infectious organisms   . Acute bronchiolitis due to respiratory syncytial virus (RSV)   . Arthritis    OA AND PAIN RIGHT KNEE  . Degeneration of intervertebral disc, site unspecified   . Disorder of bone and cartilage, unspecified   . Disturbance of skin sensation   . Encounter for long-term (current) use of other medications   . GERD (gastroesophageal reflux disease)    PT STATES SEVERE REFLUX IF SHE DOES NOT TAKE HER OMEPRAZOLE  . Headache(784.0)    WHEN REFLUX SEVERE  . Hypothyroidism   . Long term (current) use of anticoagulants   . Osteoarthrosis, unspecified whether generalized or localized, lower leg   . Osteoporosis, unspecified   . Other and unspecified hyperlipidemia   . Other atopic dermatitis and related conditions   . Other malaise and fatigue   . Other specified pre-operative examination 11/07/2011  . Thoracic or lumbosacral neuritis or radiculitis, unspecified   . Unspecified vitamin D deficiency   . Vegetarian diet     Past Surgical History:  Procedure Laterality Date  . CERVICAL BIOPSY  W/ LOOP ELECTRODE EXCISION    . EYE SURGERY  2016   Left eye, Cataract Removal  . TEETH EXTRACTIONS--NO OTHER SURGERY    . TOTAL KNEE ARTHROPLASTY  11/28/2011   Procedure: TOTAL KNEE  ARTHROPLASTY;  Surgeon: Tobi Bastos, MD;  Location: [facility];  Service: Orthopedics;  Laterality: Right;  . TUBAL LIGATION      There were no vitals filed for this visit.      Subjective Assessment - 07/09/16 1103    Subjective Pt reports no change   Currently in Pain? No/denies   Pain Score 0-No pain            [facility] PT Assessment - 07/09/16 0001      AROM   Overall AROM Comments AROM of the left knee 0-127                     [facility] Adult PT Treatment/Exercise - 07/09/16 0001      Lumbar Exercises: Machines for Strengthening   Other Lumbar Machine Exercise Rows & Lats 15lb 2x10     Lumbar Exercises: Standing   Other Standing Lumbar Exercises Front raises with yellow ball 2x10   Other Standing Lumbar Exercises Standing straight arm pull downs 10lb 2x10; Standing trunk rotations 5lb x10 each      Knee/Hip Exercises: Aerobic   Nustep level 5 x 6 minutes     Knee/Hip Exercises: Standing   Other Standing Knee Exercises Standing hip abd 3lb 2x10, ext x10                   PT Short Term Goals - 05/22/16 6294  PT SHORT TERM GOAL #1   Title independent with initial HEP   Time 2   Period Weeks   Status New           PT Long Term Goals - 07/09/16 1119      PT LONG TERM GOAL #2   Title increase ROM of the left knee to 0-116 degrees flexion               Plan - 07/09/16 1143    Clinical Impression Statement Continues to only reports a running sensation down her L thigh when laying supine at night. sensation goes away when she turns to her side. Completed all exercises well and has met L knee ROM goal.   Rehab Potential Good   PT Frequency 2x / week   PT Duration 8 weeks   PT Treatment/Interventions Cryotherapy;Electrical Stimulation;Iontophoresis 34m/ml Dexamethasone;Moist Heat;Ultrasound;Gait training;Functional mobility training;Patient/family education;Balance training;Therapeutic exercise;Therapeutic activities;Manual  techniques;Taping;Vasopneumatic Device   PT Next Visit Plan D/C next visit due to lack of progress.      Patient will benefit from skilled therapeutic intervention in order to improve the following deficits and impairments:  Abnormal gait, Decreased mobility, Decreased range of motion, Decreased strength, Difficulty walking, Impaired flexibility, Pain  Visit Diagnosis: Pain in left leg  Iliotibial band syndrome of left side     Problem List Patient Active Problem List   Diagnosis Date Noted  . Muscle spasm of left lower extremity 05/11/2016  . Swelling of joint of left knee 05/11/2016  . Dysplasia of cervix, low grade (CIN 1) 05/11/2016  . High risk medication use 05/11/2016  . Thyroid activity decreased 11/19/2014  . Pain, joint, multiple sites 11/19/2014  . Numbness and tingling of left leg 04/06/2014  . Routine general medical examination at a health care facility 09/29/2013  . Osteoporosis, post-menopausal 04/21/2013  . Cough variant asthma 03/17/2013  . Psoriasis 03/17/2013  . Persistent dry cough 03/17/2013  . Osteoarthritis of left knee 03/03/2013  . Psoriasis-like skin disease 03/03/2013  . Eczema 01/07/2013  . Need for prophylactic vaccination and inoculation against influenza 01/07/2013  . Prediabetes 12/17/2012  . Cough 12/17/2012  . Reactive airway disease with wheezing 12/03/2012  . URI, acute 12/03/2012  . Hypothyroidism 10/29/2012  . Unspecified vitamin D deficiency 10/29/2012  . Hyperlipidemia LDL goal <130 10/29/2012  . GERD (gastroesophageal reflux disease) 10/29/2012  . Osteoporosis 10/29/2012  . Myalgia and myositis 10/29/2012    RScot Jun PTA 07/09/2016, 11:45 AM  [place] [address]Phone: [phone]  Fax:  [phone] Name: [name] FIKESMRN: [id number] of Birth: 3Jan 09, 1947

## 2016-07-12 ENCOUNTER — Ambulatory Visit: Payer: Medicare Other | Admitting: Physical Therapy

## 2016-07-12 ENCOUNTER — Encounter: Payer: Self-pay | Admitting: Physical Therapy

## 2016-07-12 DIAGNOSIS — M25552 Pain in left hip: Secondary | ICD-10-CM

## 2016-07-12 DIAGNOSIS — M79605 Pain in left leg: Secondary | ICD-10-CM | POA: Diagnosis not present

## 2016-07-12 DIAGNOSIS — M7632 Iliotibial band syndrome, left leg: Secondary | ICD-10-CM

## 2016-07-12 NOTE — Therapy (Signed)
[place], [place], [address] Phone: [phone]   Fax:  [phone]  Physical Therapy Treatment  Patient Details  Name: [name] [name] MRN: [id number] Date of Birth: 03-20-1946 Referring Provider: Kathryne Hitch  Encounter Date: 07/12/2016      PT End of Session - 07/12/16 1049    Visit Number 14   PT Start Time 1004   PT Stop Time 0737   PT Time Calculation (min) 40 min   Activity Tolerance Patient tolerated treatment well   Behavior During Therapy [facility] for tasks assessed/performed      Past Medical History:  Diagnosis Date  . Acute bronchiolitis due to other infectious organisms   . Acute bronchiolitis due to respiratory syncytial virus (RSV)   . Arthritis    OA AND PAIN RIGHT KNEE  . Degeneration of intervertebral disc, site unspecified   . Disorder of bone and cartilage, unspecified   . Disturbance of skin sensation   . Encounter for long-term (current) use of other medications   . GERD (gastroesophageal reflux disease)    PT STATES SEVERE REFLUX IF SHE DOES NOT TAKE HER OMEPRAZOLE  . Headache(784.0)    WHEN REFLUX SEVERE  . Hypothyroidism   . Long term (current) use of anticoagulants   . Osteoarthrosis, unspecified whether generalized or localized, lower leg   . Osteoporosis, unspecified   . Other and unspecified hyperlipidemia   . Other atopic dermatitis and related conditions   . Other malaise and fatigue   . Other specified pre-operative examination 11/07/2011  . Thoracic or lumbosacral neuritis or radiculitis, unspecified   . Unspecified vitamin D deficiency   . Vegetarian diet     Past Surgical History:  Procedure Laterality Date  . CERVICAL BIOPSY  W/ LOOP ELECTRODE EXCISION    . EYE SURGERY  2016   Left eye, Cataract Removal  . TEETH EXTRACTIONS--NO OTHER SURGERY    . TOTAL KNEE ARTHROPLASTY  11/28/2011   Procedure: TOTAL KNEE ARTHROPLASTY;  Surgeon: Tobi Bastos, MD;  Location: [facility];  Service: Orthopedics;  Laterality: Right;  . TUBAL LIGATION      There were no vitals filed for this visit.      Subjective Assessment - 07/12/16 1035    Subjective Patient continues to report "running pain" down the left lateral thigh only when lying on her back.  Pain is gone when she is on her side, and gone when she is up and moving.  The husband reports that she had this years ago and MRI and NCV did not show anything but about a month later it went away.  They report that this has lasted 8 months   Currently in Pain? No/denies   Aggravating Factors  pain is only when she lies on her back, up to 7/10                         [facility] Adult PT Treatment/Exercise - 07/12/16 0001      Lumbar Exercises: Stretches   Passive Hamstring Stretch 3 reps;10 seconds   Single Knee to Chest Stretch 3 reps;10 seconds  PT over pressure   Lower Trunk Rotation 3 reps;10 seconds  PT over pressure   ITB Stretch 3 reps;10 seconds   Piriformis Stretch 3 reps;10 seconds     Lumbar Exercises: Supine   Ab Set 10 reps;3 seconds   Clam 15 reps;3 seconds   Bridge 15  reps;3 seconds   Straight Leg Raise 15 reps   Other Supine Lumbar Exercises Ball squeeze and bridges 2x10      Knee/Hip Exercises: Stretches   Other Knee/Hip Stretches Thomas stretch LLE multiple reps with different hold times                  PT Short Term Goals - 05/22/16 0829      PT SHORT TERM GOAL #1   Title independent with initial HEP   Time 2   Period Weeks   Status New           PT Long Term Goals - 07/12/16 1157      PT LONG TERM GOAL #1   Title decrease pain 50%   Status Partially Met     PT LONG TERM GOAL #2   Title increase ROM of the left knee to 0-116 degrees flexion   Status Partially Met     PT LONG TERM GOAL #3   Title go up and down stairs step over step   Status Partially Met     PT LONG TERM GOAL #4   Title report sleep 50% better    Status Partially Met               Plan - 07/12/16 1153    Clinical Impression Statement Patient with pain in the left lateral thigh only with lying on her back, reports that with walking or lying on her side no pain.  Describes the pain as "running down" the left leg.  The husband reports that she had this a few years ago and 2 MRI's and a NCV was performed and they report that nothing was found.  We have tried many differen modalities and exercises and stretches with no change in her symptoms or pain levels.   PT Next Visit Plan D/C next visit due to lack of progress.   Consulted and Agree with Plan of Care Patient      Patient will benefit from skilled therapeutic intervention in order to improve the following deficits and impairments:  Abnormal gait, Decreased mobility, Decreased range of motion, Decreased strength, Difficulty walking, Impaired flexibility, Pain  Visit Diagnosis: Iliotibial band syndrome of left side  Pain in left hip     Problem List Patient Active Problem List   Diagnosis Date Noted  . Muscle spasm of left lower extremity 05/11/2016  . Swelling of joint of left knee 05/11/2016  . Dysplasia of cervix, low grade (CIN 1) 05/11/2016  . High risk medication use 05/11/2016  . Thyroid activity decreased 11/19/2014  . Pain, joint, multiple sites 11/19/2014  . Numbness and tingling of left leg 04/06/2014  . Routine general medical examination at a health care facility 09/29/2013  . Osteoporosis, post-menopausal 04/21/2013  . Cough variant asthma 03/17/2013  . Psoriasis 03/17/2013  . Persistent dry cough 03/17/2013  . Osteoarthritis of left knee 03/03/2013  . Psoriasis-like skin disease 03/03/2013  . Eczema 01/07/2013  . Need for prophylactic vaccination and inoculation against influenza 01/07/2013  . Prediabetes 12/17/2012  . Cough 12/17/2012  . Reactive airway disease with wheezing 12/03/2012  . URI, acute 12/03/2012  . Hypothyroidism 10/29/2012  .  Unspecified vitamin D deficiency 10/29/2012  . Hyperlipidemia LDL goal <130 10/29/2012  . GERD (gastroesophageal reflux disease) 10/29/2012  . Osteoporosis 10/29/2012  . Myalgia and myositis 10/29/2012    Sumner Boast., PT 07/12/2016, 11:58 AM  [place]  [place], [place], [address] Phone: [phone]   Fax:  [phone]  Name: ARACELIS ULREY MRN: [id number] Date of Birth: August 12, 1945

## 2016-07-16 ENCOUNTER — Ambulatory Visit: Payer: Medicare Other | Admitting: Obstetrics and Gynecology

## 2016-07-17 ENCOUNTER — Encounter: Payer: Self-pay | Admitting: Obstetrics and Gynecology

## 2016-07-17 ENCOUNTER — Ambulatory Visit (INDEPENDENT_AMBULATORY_CARE_PROVIDER_SITE_OTHER): Payer: Medicare Other | Admitting: Obstetrics and Gynecology

## 2016-07-17 VITALS — BP 140/90 | HR 78 | Resp 20 | Ht <= 58 in | Wt 131.6 lb

## 2016-07-17 DIAGNOSIS — N870 Mild cervical dysplasia: Secondary | ICD-10-CM | POA: Diagnosis not present

## 2016-07-17 DIAGNOSIS — R87612 Low grade squamous intraepithelial lesion on cytologic smear of cervix (LGSIL): Secondary | ICD-10-CM | POA: Diagnosis not present

## 2016-07-17 DIAGNOSIS — N882 Stricture and stenosis of cervix uteri: Secondary | ICD-10-CM | POA: Diagnosis not present

## 2016-07-17 DIAGNOSIS — Z124 Encounter for screening for malignant neoplasm of cervix: Secondary | ICD-10-CM

## 2016-07-17 DIAGNOSIS — Z9889 Other specified postprocedural states: Secondary | ICD-10-CM | POA: Diagnosis not present

## 2016-07-17 DIAGNOSIS — N888 Other specified noninflammatory disorders of cervix uteri: Secondary | ICD-10-CM | POA: Diagnosis not present

## 2016-07-17 NOTE — Progress Notes (Signed)
GYNECOLOGY  VISIT   HPI: 71 y.o.   Married  Hindu  Female. [id number] with No LMP recorded. Patient is postmenopausal.   here for 6 month pap/ECC.  The patient underwent a leep in 9/17, pathology with CIN I and unclear margins. The ECC was negative. She has a known stenotic cervix and pre-treated with vaginal estrogen and cytotec.  The patient is without c/o.   GYNECOLOGIC HISTORY: No LMP recorded. Patient is postmenopausal. Contraception:Postmenopause Menopausal hormone therapy: Premarin cream        OB History    Gravida Para Term Preterm AB Living   SAB TAB Ectopic Multiple Live Births           3         Patient Active Problem List   Diagnosis Date Noted  . Muscle spasm of left lower extremity 05/11/2016  . Swelling of joint of left knee 05/11/2016  . Dysplasia of cervix, low grade (CIN 1) 05/11/2016  . High risk medication use 05/11/2016  . Thyroid activity decreased 11/19/2014  . Pain, joint, multiple sites 11/19/2014  . Numbness and tingling of left leg 04/06/2014  . Routine general medical examination at a health care facility 09/29/2013  . Osteoporosis, post-menopausal 04/21/2013  . Cough variant asthma 03/17/2013  . Psoriasis 03/17/2013  . Persistent dry cough 03/17/2013  . Osteoarthritis of left knee 03/03/2013  . Psoriasis-like skin disease 03/03/2013  . Eczema 01/07/2013  . Need for prophylactic vaccination and inoculation against influenza 01/07/2013  . Prediabetes 12/17/2012  . Cough 12/17/2012  . Reactive airway disease with wheezing 12/03/2012  . URI, acute 12/03/2012  . Hypothyroidism 10/29/2012  . Unspecified vitamin D deficiency 10/29/2012  . Hyperlipidemia LDL goal <130 10/29/2012  . GERD (gastroesophageal reflux disease) 10/29/2012  . Osteoporosis 10/29/2012  . Myalgia and myositis 10/29/2012    Past Medical History:  Diagnosis Date  . Acute bronchiolitis due to other infectious organisms   . Acute bronchiolitis due to respiratory  syncytial virus (RSV)   . Arthritis    OA AND PAIN RIGHT KNEE  . Degeneration of intervertebral disc, site unspecified   . Disorder of bone and cartilage, unspecified   . Disturbance of skin sensation   . Encounter for long-term (current) use of other medications   . GERD (gastroesophageal reflux disease)    PT STATES SEVERE REFLUX IF SHE DOES NOT TAKE HER OMEPRAZOLE  . Headache(784.0)    WHEN REFLUX SEVERE  . Hypothyroidism   . Long term (current) use of anticoagulants   . Osteoarthrosis, unspecified whether generalized or localized, lower leg   . Osteoporosis, unspecified   . Other and unspecified hyperlipidemia   . Other atopic dermatitis and related conditions   . Other malaise and fatigue   . Other specified pre-operative examination 11/07/2011  . Thoracic or lumbosacral neuritis or radiculitis, unspecified   . Unspecified vitamin D deficiency   . Vegetarian diet     Past Surgical History:  Procedure Laterality Date  . CERVICAL BIOPSY  W/ LOOP ELECTRODE EXCISION    . EYE SURGERY  2016   Left eye, Cataract Removal  . TEETH EXTRACTIONS--NO OTHER SURGERY    . TOTAL KNEE ARTHROPLASTY  11/28/2011   Procedure: TOTAL KNEE ARTHROPLASTY;  Surgeon: Jacki Cones, MD;  Location: [facility];  Service: Orthopedics;  Laterality: Right;  . TUBAL LIGATION      Current Outpatient Prescriptions  Medication Sig Dispense Refill  . aspirin  81 MG tablet Take 81 mg by mouth daily.    . calcium carbonate (OS-CAL) 600 MG TABS Take 600 mg by mouth 2 (two) times daily with a meal. Take one tablet twice a day for calcium supplement    . conjugated estrogens (PREMARIN) vaginal cream 1/2 gram vaginally every night for 2 weeks prior to your follow up appointment in March 30 g 0  . denosumab (PROLIA) 60 MG/ML SOLN injection Inject 60 mg into the skin once. Administer in upper arm, thigh, or abdomen 180 mL 0  . ferrous sulfate 325 (65 FE) MG tablet Take 1 tablet (325 mg total) by mouth daily with  breakfast. 30 tablet 3  . levothyroxine (SYNTHROID, LEVOTHROID) 25 MCG tablet Take 37.5 mcg by mouth daily before breakfast. Take 1 and 1/2 tablets of 25 mcg to equal 37.5 mcg daily.    . meloxicam (MOBIC) 15 MG tablet Take one tablet once daily for leg pain 90 tablet 3  . methocarbamol (ROBAXIN) 500 MG tablet Take 1 tablet (500 mg total) by mouth every 8 (eight) hours as needed for muscle spasms. 90 tablet 3  . omeprazole (PRILOSEC) 20 MG capsule Take one capsule by mouth once daily to reduce stomach acid 90 capsule 3  . simvastatin (ZOCOR) 20 MG tablet Take one tablet by mouth once daily for cholesterol 90 tablet 3  . tiZANidine (ZANAFLEX) 4 MG tablet Take 4 mg by mouth every 8 (eight) hours as needed for muscle spasms.     No current facility-administered medications for this visit.      ALLERGIES: Prednisolone  Family History  Problem Relation Age of Onset  . Alport syndrome Brother     Social History   Social History  . Marital status: Married    Spouse name: N/A  . Number of children: N/A  . Years of education: N/A   Occupational History  . Not on file.   Social History Main Topics  . Smoking status: Never Smoker  . Smokeless tobacco: Never Used  . Alcohol use No  . Drug use: No  . Sexual activity: Yes    Birth control/ protection: Post-menopausal   Other Topics Concern  . Not on file   Social History Narrative  . No narrative on file    ROS  PHYSICAL EXAMINATION:    BP 140/90 (BP Location: Right Arm, Patient Position: Sitting, Cuff Size: Normal)   Pulse 78   Resp 20   Ht  (1.448 m)   Wt 131 lb 9.6 oz (59.7 kg)   BMI 28.48 kg/m     General appearance: alert, cooperative and appears stated age Pelvic: External genitalia:  no lesions              Urethra:  normal appearing urethra with no masses, tenderness or lesions              Bartholins and Skenes: normal                 Vagina: normal appearing vagina with normal color and discharge, no  lesions              Cervix: flush with the vagina and stenotic  Pap obtained Tenaculum placed at 12 o'clock, cervix dilated to a #4 hagar dilator. Difficult to dilate, because of the small area in her upper vagina with the tenaculum and speculum. Definitely in the endocervix. ECC obtained.  Chaperone was present for exam.  ASSESSMENT H/O Leep with CIN I with unclear margins and negative ECC   PLAN Pap with hpv, ECC  She requests that we call her son Elby Showers with the results and send a copy to her at her home with an explanation.    An After Visit Summary was printed and given to the patient.

## 2016-07-19 ENCOUNTER — Ambulatory Visit (INDEPENDENT_AMBULATORY_CARE_PROVIDER_SITE_OTHER): Payer: Medicare Other | Admitting: *Deleted

## 2016-07-19 DIAGNOSIS — M810 Age-related osteoporosis without current pathological fracture: Secondary | ICD-10-CM | POA: Diagnosis not present

## 2016-07-19 LAB — IPS PAP TEST WITH HPV

## 2016-07-19 MED ORDER — DENOSUMAB 60 MG/ML ~~LOC~~ SOLN
60.0000 mg | Freq: Once | SUBCUTANEOUS | Status: AC
  Administered 2016-07-19: 60 mg via SUBCUTANEOUS

## 2016-07-23 ENCOUNTER — Encounter: Payer: Self-pay | Admitting: Internal Medicine

## 2016-07-23 ENCOUNTER — Ambulatory Visit (INDEPENDENT_AMBULATORY_CARE_PROVIDER_SITE_OTHER): Payer: Medicare Other | Admitting: Internal Medicine

## 2016-07-23 VITALS — BP 128/70 | HR 67 | Temp 97.5°F | Wt 130.0 lb

## 2016-07-23 DIAGNOSIS — E034 Atrophy of thyroid (acquired): Secondary | ICD-10-CM

## 2016-07-23 DIAGNOSIS — L820 Inflamed seborrheic keratosis: Secondary | ICD-10-CM

## 2016-07-23 MED ORDER — LEVOTHYROXINE SODIUM 25 MCG PO TABS: 37.5000 ug | ORAL_TABLET | Freq: Every day | ORAL | 1 refills | Status: DC

## 2016-07-23 MED ORDER — LEVOTHYROXINE SODIUM 25 MCG PO TABS: 37.5000 ug | ORAL_TABLET | Freq: Every day | ORAL | 4 refills | Status: DC

## 2016-07-23 NOTE — Progress Notes (Signed)
Location:  [facility] Provider: Curvin Hunger L. Renato Gails, D.O., C.M.D.  Goals of Care:  Advanced Directives 05/11/2016  Does Patient Have a Medical Advance Directive? No  Does patient want to make changes to medical advance directive? No - Patient declined  Would patient like information on creating a medical advance directive? -   Chief Complaint  Patient presents with  . Acute Visit    mole under right eye with pain sometimes    HPI: Patient is a 71 y.o. female seen today for an acute visit for mole under right eye.   She has had a "mole" under her right eye for 5 months. It keeps growing, and is getting hard and painful. It is not itchy. It does not bleed. Has never seen a dermatologist. She has never had this problem before. She does have a small "mole" directly under her right lower eye lid that has been there "forever". Is wondering if there is a cream that could be used to remove it.   Her husband had her thyroid results and asked if they were ok.  TSH wnl last month on 37.62mcg.  Past Medical History:  Diagnosis Date  . Acute bronchiolitis due to other infectious organisms   . Acute bronchiolitis due to respiratory syncytial virus (RSV)   . Arthritis    OA AND PAIN RIGHT KNEE  . Degeneration of intervertebral disc, site unspecified   . Disorder of bone and cartilage, unspecified   . Disturbance of skin sensation   . Encounter for long-term (current) use of other medications   . GERD (gastroesophageal reflux disease)    PT STATES SEVERE REFLUX IF SHE DOES NOT TAKE HER OMEPRAZOLE  . Headache(784.0)    WHEN REFLUX SEVERE  . Hypothyroidism   . Long term (current) use of anticoagulants   . Osteoarthrosis, unspecified whether generalized or localized, lower leg   . Osteoporosis, unspecified   . Other and unspecified hyperlipidemia   . Other atopic dermatitis and related conditions   . Other malaise and fatigue   . Other specified pre-operative examination 11/07/2011  . Thoracic  or lumbosacral neuritis or radiculitis, unspecified   . Unspecified vitamin D deficiency   . Vegetarian diet     Past Surgical History:  Procedure Laterality Date  . CERVICAL BIOPSY  W/ LOOP ELECTRODE EXCISION    . EYE SURGERY  2016   Left eye, Cataract Removal  . TEETH EXTRACTIONS--NO OTHER SURGERY    . TOTAL KNEE ARTHROPLASTY  11/28/2011   Procedure: TOTAL KNEE ARTHROPLASTY;  Surgeon: Jacki Cones, MD;  Location: [facility];  Service: Orthopedics;  Laterality: Right;  . TUBAL LIGATION      Allergies  Allergen Reactions  . Prednisolone Swelling    Allergies as of 07/23/2016      Reactions   Prednisolone Swelling      Medication List       Accurate as of 07/23/16  3:55 PM. Always use your most recent med list.          aspirin 81 MG tablet Take 81 mg by mouth daily.   calcium carbonate 600 MG Tabs tablet Commonly known as:  OS-CAL Take 600 mg by mouth 2 (two) times daily with a meal. Take one tablet twice a day for calcium supplement   conjugated estrogens vaginal cream Commonly known as:  PREMARIN 1/2 gram vaginally every night for 2 weeks prior to your follow up appointment in March   ferrous sulfate 325 (65 FE) MG tablet  Take 1 tablet (325 mg total) by mouth daily with breakfast.   levothyroxine 25 MCG tablet Commonly known as:  SYNTHROID, LEVOTHROID Take 1.5 tablets (37.5 mcg total) by mouth daily before breakfast.   meloxicam 15 MG tablet Commonly known as:  MOBIC Take one tablet once daily for leg pain   methocarbamol 500 MG tablet Commonly known as:  ROBAXIN Take 1 tablet (500 mg total) by mouth every 8 (eight) hours as needed for muscle spasms.   omeprazole 20 MG capsule Commonly known as:  PRILOSEC Take one capsule by mouth once daily to reduce stomach acid   simvastatin 20 MG tablet Commonly known as:  ZOCOR Take one tablet by mouth once daily for cholesterol   tiZANidine 4 MG tablet Commonly known as:  ZANAFLEX Take 4 mg by mouth every 8  (eight) hours as needed for muscle spasms.       Review of Systems:  Review of Systems  Respiratory: Negative for shortness of breath.   Cardiovascular: Negative for chest pain and palpitations.  Skin:       Mole below right eye, tender to touch  Endo/Heme/Allergies:       Hypothyroid    Health Maintenance  Topic Date Due  . COLONOSCOPY  05/07/2017 (Originally 07/05/1993)  . Hepatitis C Screening  05/07/2018 (Originally 11/07/1945)  . MAMMOGRAM  12/11/2016  . TETANUS/TDAP  03/27/2021  . INFLUENZA VACCINE  Completed  . DEXA SCAN  Completed  . PNA vac Low Risk Adult  Completed    Physical Exam: Vitals:   07/23/16 1530  BP: 128/70  Pulse: 67  Temp: 97.5 F (36.4 C)  TempSrc: Oral  SpO2: 97%  Weight: 130 lb (59 kg)   Body mass index is 28.13 kg/m. Physical Exam  Skin:  Tiny raised brown papule that is tender beneath right eye    Labs reviewed: Basic Metabolic Panel:  Recent Labs  05/08/7204/28/17 1650 05/09/16 0826 06/20/16 1019  NA 140 136  --   K 4.6 4.5  --   CL 102 104  --   CO2 25 24  --   GLUCOSE 88 105*  --   BUN 9 12  --   CREATININE 0.51* 0.52*  --   CALCIUM 9.3 8.9  --   TSH 1.600 5.11* 2.66   Liver Function Tests:  Recent Labs  11/02/15 1650 05/09/16 0826  AST 25 24  ALT 25 25  ALKPHOS 85 63  BILITOT <0.2 0.6  PROT 7.0 6.5  ALBUMIN 4.2 4.0   No results for input(s): LIPASE, AMYLASE in the last 8760 hours. No results for input(s): AMMONIA in the last 8760 hours. CBC:  Recent Labs  11/02/15 1650 11/30/15 0839  WBC 9.6 8.5  NEUTROABS 4.3 4,845  HGB  --  11.4*  HCT 34.2 35.0  MCV 79 81.6  PLT 356 311   Lipid Panel:  Recent Labs  11/02/15 1650 05/09/16 0826  CHOL 184 163  HDL 53 55  LDLCALC 95 81  TRIG 181* 137  CHOLHDL 3.5 3.0   Lab Results  Component Value Date   HGBA1C 5.8 (H) 11/02/2015    Assessment/Plan 1. Seborrheic keratosis, inflamed -appears to be a brown version of this -could also be a nevus, but seems to  be benign -recommended she see dermatology but she refused--wanted a cream to get it to go away, but I'm not familiar with this?    2. Hypothyroidism due to acquired atrophy of thyroid -last tsh wnl so cont the  same synthroid--refills sent in  Labs/tests ordered:  No orders of the defined types were placed in this encounter.   Next appt:  11/06/2016  Kavir Savoca L. Sharad Vaneaton, D.O. Geriatrics [facility] [address], [place] [address] Cell Phone (Mon-Fri 8am-5pm):  [phone] On Call:  [phone] & follow prompts after 5pm & weekends Office Phone:  [phone] Office Fax:  [phone]

## 2016-07-25 ENCOUNTER — Telehealth: Payer: Self-pay

## 2016-07-25 DIAGNOSIS — M545 Low back pain: Secondary | ICD-10-CM | POA: Diagnosis not present

## 2016-07-25 NOTE — Telephone Encounter (Signed)
Left message to call Kaitlyn at [phone]. 

## 2016-07-25 NOTE — Telephone Encounter (Signed)
-----   Message from Romualdo BolkJill Evelyn Jertson, MD sent at 07/24/2016 10:52 AM EDT ----- Please call the patient's son (1st contact # on her dpr) and review the results and recommendations. Please also send a copy of the pathology report to the patient (requested verbally when in the office).  Her pap returned with LSIL, +HPV. Her ECC was negative for dysplasia, but did not show endocervical cells. She has a stenotic cervix, I dilated her cervix and got into her endocervical canal to get the sample (see note). She could have vaginal dysplasia. She needs to come back for another colposcopy.

## 2016-07-25 NOTE — Telephone Encounter (Signed)
Spoke to patient's son Elby ShowersRaj, okay per ROI. Advised of results and message as seen below from Dr.Jertson. Elby ShowersRaj verbalizes understanding and will speak with the patient. Will return call to schedule colposcopy after discussing the patient's schedule with her. Patient's pap smear results and surgical pathology mailed to verified home address on file.

## 2016-08-07 ENCOUNTER — Encounter: Payer: Self-pay | Admitting: *Deleted

## 2016-08-07 NOTE — Telephone Encounter (Signed)
Kennon Rounds, Can you please send this patient a letter or call and speak with her son. Explaining that I understand her frustration with her abnormal paps, but that it is very important that she have f/u. She can do that somewhere else if she is more comfortable, but persistently abnormal paps can be a sign of pre-cancerous changes in her cervix, or in her vagina. In the fall she had a Leep with CIN I, possible positive margin, negative ECC. The patient is PMP and has a small cervix. I was unable to do a second pass or get a bigger specimen because of her anatomy and risk of injury.  I had her use estrogen and cytotec to help with dilation last month to get an adequate ECC. I was definitely in her cervix, the specimen still not adequate.  We can refer her to Gyn onc or another practice if she desires, but doing nothing is not a good option.

## 2016-08-07 NOTE — Telephone Encounter (Signed)
Letter to your desk for review.  

## 2016-08-07 NOTE — Telephone Encounter (Signed)
Spoke with patient's son Elby Showers, okay per ROI. Following up regarding patient scheduling colposcopy. Per Elby Showers he has reviewed this with the patient and the patient's spouse and they decline to schedule colposcopy at this time. Advised of importance of having the colposcopy performed and that this is something the physician strongly encourages. Advised that with the patient having an abnormal pap smear it is important that she have further evaluation to ensure there are no further cell changes deeper into the cervix that cannot be seen with a pap. Raj verbalizes understanding, but states that his mother is declining to have this performed at this time and they will not be scheduling.   Routing to Dr.Jertson and Billie Ruddy, RN.

## 2016-08-28 ENCOUNTER — Telehealth: Payer: Self-pay | Admitting: *Deleted

## 2016-08-28 DIAGNOSIS — R87612 Low grade squamous intraepithelial lesion on cytologic smear of cervix (LGSIL): Secondary | ICD-10-CM

## 2016-08-28 NOTE — Telephone Encounter (Signed)
Patient previously declined to schedule colpo. See previous phone encounter. Follow-up call to patient's son [name] [name] (listed on DPR).   Advised calling to discuss patients decline to proceed with recommended colposcopy. Advised Dr Oscar La understanding of difficulty of this procedure for patient but simply cant let this go untreated. If declined to proceed, at least could come if for consult to discuss alternatives.  [name] [name] requests we call back tomorrow after he has a chance to discuss with mother.

## 2016-08-29 NOTE — Telephone Encounter (Signed)
Call to [facility], agreeable to appointment to discuss and possibly proceed with colposcopy. Requests to plan as if proceeding with procedure since difficult to get mother (patient) back into office.   Elby Showers then uncertain which day works best for mother and states he will call me back once her confirms which day they can both come.

## 2016-08-30 NOTE — Telephone Encounter (Signed)
Call form [name] [name], appointment scheduled for mother on 09-06-16 at 1100 to discuss results, options and possibly proceed with procedure.   Routing to provider for final review. Patient agreeable to disposition. Will close encounter.

## 2016-09-06 ENCOUNTER — Ambulatory Visit (INDEPENDENT_AMBULATORY_CARE_PROVIDER_SITE_OTHER): Payer: Medicare Other | Admitting: Obstetrics and Gynecology

## 2016-09-06 ENCOUNTER — Encounter: Payer: Self-pay | Admitting: Obstetrics and Gynecology

## 2016-09-06 DIAGNOSIS — R87612 Low grade squamous intraepithelial lesion on cytologic smear of cervix (LGSIL): Secondary | ICD-10-CM | POA: Diagnosis not present

## 2016-09-06 NOTE — Patient Instructions (Signed)

## 2016-09-06 NOTE — Progress Notes (Signed)
GYNECOLOGY  VISIT   HPI: 71 y.o.   Married  Hindu  female   [phone] with No LMP recorded. Patient is postmenopausal.   here for consult to discuss abnormal PAP    The patient has a h/o an ASC-H pap, + HPV in 7/17. Colposcopy was unsatisfactory, biopsy and ECC were negative. She underwent a LEEP in 9/17, only one pass could be taken because of her short, postmenopausal cervix. The pathology returned with CIN I with possibly + margins. ECC was scanty, but was benign. She was noted to have a very short, stenotic cervix at her post LEEP exam. In 3/18 she returned for a pap and ECC. She was pre-treated with estrogen cream and cytotec. Her pap from that visit returned with LSIL. Her cervix was dilated with mini-dilators (tenaculum needed) and an ECC was obtained. The ECC was definitely from within her cervix, it returned with benign squamous cells, no transformation zone identified, no dysplasia. This was supported with the Ki-67 and p 16 immunohistochemistry stains.  She presents today with her husband and interpreter to further discuss the results and recommendations.   GYNECOLOGIC HISTORY: No LMP recorded. Patient is postmenopausal. Contraception:postmenopasue  Menopausal hormone therapy: Premarin         OB History    Gravida Para Term Preterm AB Living   3 3 3     3    SAB TAB Ectopic Multiple Live Births           3         Patient Active Problem List   Diagnosis Date Noted  . Muscle spasm of left lower extremity 05/11/2016  . Swelling of joint of left knee 05/11/2016  . Dysplasia of cervix, low grade (CIN 1) 05/11/2016  . High risk medication use 05/11/2016  . Thyroid activity decreased 11/19/2014  . Pain, joint, multiple sites 11/19/2014  . Numbness and tingling of left leg 04/06/2014  . Routine general medical examination at a health care facility 09/29/2013  . Osteoporosis, post-menopausal 04/21/2013  . Cough variant asthma 03/17/2013  . Psoriasis 03/17/2013  . Persistent dry  cough 03/17/2013  . Osteoarthritis of left knee 03/03/2013  . Psoriasis-like skin disease 03/03/2013  . Eczema 01/07/2013  . Need for prophylactic vaccination and inoculation against influenza 01/07/2013  . Prediabetes 12/17/2012  . Cough 12/17/2012  . Reactive airway disease with wheezing 12/03/2012  . URI, acute 12/03/2012  . Hypothyroidism 10/29/2012  . Unspecified vitamin D deficiency 10/29/2012  . Hyperlipidemia LDL goal <130 10/29/2012  . GERD (gastroesophageal reflux disease) 10/29/2012  . Osteoporosis 10/29/2012  . Myalgia and myositis 10/29/2012    Past Medical History:  Diagnosis Date  . Acute bronchiolitis due to other infectious organisms   . Acute bronchiolitis due to respiratory syncytial virus (RSV)   . Arthritis    OA AND PAIN RIGHT KNEE  . Degeneration of intervertebral disc, site unspecified   . Disorder of bone and cartilage, unspecified   . Disturbance of skin sensation   . Encounter for long-term (current) use of other medications   . GERD (gastroesophageal reflux disease)    PT STATES SEVERE REFLUX IF SHE DOES NOT TAKE HER OMEPRAZOLE  . Headache(784.0)    WHEN REFLUX SEVERE  . Hypothyroidism   . Long term (current) use of anticoagulants   . Osteoarthrosis, unspecified whether generalized or localized, lower leg   . Osteoporosis, unspecified   . Other and unspecified hyperlipidemia   . Other atopic dermatitis and related conditions   .  Other malaise and fatigue   . Other specified pre-operative examination 11/07/2011  . Thoracic or lumbosacral neuritis or radiculitis, unspecified   . Unspecified vitamin D deficiency   . Vegetarian diet     Past Surgical History:  Procedure Laterality Date  . CERVICAL BIOPSY  W/ LOOP ELECTRODE EXCISION    . EYE SURGERY  2016   Left eye, Cataract Removal  . TEETH EXTRACTIONS--NO OTHER SURGERY    . TOTAL KNEE ARTHROPLASTY  11/28/2011   Procedure: TOTAL KNEE ARTHROPLASTY;  Surgeon: Tobi Bastos, MD;  Location: [facility];  Service: Orthopedics;  Laterality: Right;  . TUBAL LIGATION      Current Outpatient Prescriptions  Medication Sig Dispense Refill  . aspirin 81 MG tablet Take 81 mg by mouth daily.    . calcium carbonate (OS-CAL) 600 MG TABS Take 600 mg by mouth 2 (two) times daily with a meal. Take one tablet twice a day for calcium supplement    . conjugated estrogens (PREMARIN) vaginal cream 1/2 gram vaginally every night for 2 weeks prior to your follow up appointment in March 30 g 0  . ferrous sulfate 325 (65 FE) MG tablet Take 1 tablet (325 mg total) by mouth daily with breakfast. 30 tablet 3  . levothyroxine (SYNTHROID, LEVOTHROID) 25 MCG tablet Take 1.5 tablets (37.5 mcg total) by mouth daily before breakfast. 135 tablet 4  . omeprazole (PRILOSEC) 20 MG capsule Take one capsule by mouth once daily to reduce stomach acid 90 capsule 3  . simvastatin (ZOCOR) 20 MG tablet Take one tablet by mouth once daily for cholesterol 90 tablet 3   No current facility-administered medications for this visit.      ALLERGIES: Prednisolone  Family History  Problem Relation Age of Onset  . Alport syndrome Brother     Social History   Social History  . Marital status: Married    Spouse name: N/A  . Number of children: N/A  . Years of education: N/A   Occupational History  . Not on file.   Social History Main Topics  . Smoking status: Never Smoker  . Smokeless tobacco: Never Used  . Alcohol use No  . Drug use: No  . Sexual activity: Yes    Birth control/ protection: Post-menopausal   Other Topics Concern  . Not on file   Social History Narrative  . No narrative on file    Review of Systems  Constitutional: Negative.   HENT: Negative.   Eyes: Negative.   Respiratory: Negative.   Cardiovascular: Negative.   Gastrointestinal: Negative.   Genitourinary: Negative.   Musculoskeletal: Negative.   Skin: Negative.   Neurological: Negative.   Endo/Heme/Allergies: Negative.    Psychiatric/Behavioral: Negative.     PHYSICAL EXAMINATION:    BP 138/80 (BP Location: Right Arm, Patient Position: Sitting, Cuff Size: Normal)   Pulse 84   Resp 16   Wt 131 lb (59.4 kg)   BMI 28.35 kg/m     General appearance: alert, cooperative and appears stated age  Pelvic: External genitalia:  no lesions              Urethra:  normal appearing urethra with no masses, tenderness or lesions              Bartholins and Skenes: normal                 Vagina: normal appearing atrophic vagina with normal color and discharge, no lesions  Cervix: flush with her vagina, stenotic  Colposcopy: Unsatisfactory, no cervical lesions seen, no aceto-white changes. She did have decreased lugols uptake in a distinct pattern on the left vaginal side wall (approximately 2 cm wide), a biopsy was taken at 3 o'clock. She has some more mild lugols changes on the right vaginal side wall, diffuse, biopsy taken at 9 o'clock. Biopsy sites were treated with silver nitrate, excellent hemostasis.    Chaperone was present for exam.  ASSESSMENT Recurrent abnormal pap smear 6 months s/p leep with CIN I and possibly + margin. ECC done at the time of her pap was negative for dysplasia, no TZ seen (definitely in her cervix). All of her prior ECC's have been negative. I suspect vaginal dysplasia.     PLAN 2 vaginal biopsies taken Further plans depending on results Her lab results were all reviewed, explained the difficulty with her stenotic cervix.     An After Visit Summary was printed and given to the patient.  10 minutes face to face time in counseling, prior to the colposcopy  They would like the results called to their son and mailed to their house  CC: Dr Gildardo Cranker

## 2016-09-07 DIAGNOSIS — Z96651 Presence of right artificial knee joint: Secondary | ICD-10-CM | POA: Diagnosis not present

## 2016-09-13 ENCOUNTER — Telehealth: Payer: Self-pay

## 2016-09-13 NOTE — Telephone Encounter (Signed)
-----   Message from Romualdo BolkJill Evelyn Jertson, MD sent at 09/12/2016  6:04 PM EDT ----- Please call the patient's son and explain to him that his mother has pre-cancerous cells in her vagina. She will need to do treatment with 5FU. Probably best to have them come in to discuss with an interpreter.  Please send a copy of the lab results to the patient.

## 2016-09-13 NOTE — Telephone Encounter (Signed)
Spoke with patient's son Elby ShowersRaj, okay per ROI. Advised of results and message as seen below. Raj verbalizes understanding. States that the patient's husband is having surgery at the early part of next week and they can not come in to be seen until Thursday. Appointment scheduled for 09/20/2016 at 2 pm with Dr.Jertson. Son is agreeable to date and time.  Cc: Starla as the patient will need an interpreter for the appointment.  Routing to provider for final review. Patient agreeable to disposition. Will close encounter.

## 2016-09-14 DIAGNOSIS — M545 Low back pain: Secondary | ICD-10-CM | POA: Diagnosis not present

## 2016-09-20 ENCOUNTER — Encounter: Payer: Self-pay | Admitting: Obstetrics and Gynecology

## 2016-09-20 ENCOUNTER — Ambulatory Visit (INDEPENDENT_AMBULATORY_CARE_PROVIDER_SITE_OTHER): Payer: Medicare Other | Admitting: Obstetrics and Gynecology

## 2016-09-20 VITALS — BP 110/70 | HR 84 | Resp 16 | Wt 129.0 lb

## 2016-09-20 DIAGNOSIS — N893 Dysplasia of vagina, unspecified: Secondary | ICD-10-CM | POA: Diagnosis not present

## 2016-09-20 NOTE — Progress Notes (Signed)
GYNECOLOGY  VISIT   HPI: 71 y.o.   Married  Hindu   female   [phone] with No LMP recorded. Patient is postmenopausal.   here to discuss colpo results and treatment plan. The patient has a h/o CIN I, s/p leep. Now with VAIN II, fairly extensive area of dysplasia on colposcopy.     GYNECOLOGIC HISTORY: No LMP recorded. Patient is postmenopausal. Contraception:postmenopause  Menopausal hormone therapy: none         OB History    Gravida Para Term Preterm AB Living   3 3 3     3    SAB TAB Ectopic Multiple Live Births           3         Patient Active Problem List   Diagnosis Date Noted  . Muscle spasm of left lower extremity 05/11/2016  . Swelling of joint of left knee 05/11/2016  . Dysplasia of cervix, low grade (CIN 1) 05/11/2016  . High risk medication use 05/11/2016  . Thyroid activity decreased 11/19/2014  . Pain, joint, multiple sites 11/19/2014  . Numbness and tingling of left leg 04/06/2014  . Routine general medical examination at a health care facility 09/29/2013  . Osteoporosis, post-menopausal 04/21/2013  . Cough variant asthma 03/17/2013  . Psoriasis 03/17/2013  . Persistent dry cough 03/17/2013  . Osteoarthritis of left knee 03/03/2013  . Psoriasis-like skin disease 03/03/2013  . Eczema 01/07/2013  . Need for prophylactic vaccination and inoculation against influenza 01/07/2013  . Prediabetes 12/17/2012  . Cough 12/17/2012  . Reactive airway disease with wheezing 12/03/2012  . URI, acute 12/03/2012  . Hypothyroidism 10/29/2012  . Unspecified vitamin D deficiency 10/29/2012  . Hyperlipidemia LDL goal <130 10/29/2012  . GERD (gastroesophageal reflux disease) 10/29/2012  . Osteoporosis 10/29/2012  . Myalgia and myositis 10/29/2012    Past Medical History:  Diagnosis Date  . Acute bronchiolitis due to other infectious organisms   . Acute bronchiolitis due to respiratory syncytial virus (RSV)   . Arthritis    OA AND PAIN RIGHT KNEE  . Degeneration of  intervertebral disc, site unspecified   . Disorder of bone and cartilage, unspecified   . Disturbance of skin sensation   . Encounter for long-term (current) use of other medications   . GERD (gastroesophageal reflux disease)    PT STATES SEVERE REFLUX IF SHE DOES NOT TAKE HER OMEPRAZOLE  . Headache(784.0)    WHEN REFLUX SEVERE  . Hypothyroidism   . Long term (current) use of anticoagulants   . Osteoarthrosis, unspecified whether generalized or localized, lower leg   . Osteoporosis, unspecified   . Other and unspecified hyperlipidemia   . Other atopic dermatitis and related conditions   . Other malaise and fatigue   . Other specified pre-operative examination 11/07/2011  . Thoracic or lumbosacral neuritis or radiculitis, unspecified   . Unspecified vitamin D deficiency   . Vegetarian diet     Past Surgical History:  Procedure Laterality Date  . CERVICAL BIOPSY  W/ LOOP ELECTRODE EXCISION    . EYE SURGERY  2016   Left eye, Cataract Removal  . TEETH EXTRACTIONS--NO OTHER SURGERY    . TOTAL KNEE ARTHROPLASTY  11/28/2011   Procedure: TOTAL KNEE ARTHROPLASTY;  Surgeon: Jacki Cones, MD;  Location: [facility];  Service: Orthopedics;  Laterality: Right;  . TUBAL LIGATION      Current Outpatient Prescriptions  Medication Sig Dispense Refill  . aspirin 81 MG tablet Take 81 mg by mouth daily.    Marland Kitchen  ferrous sulfate 325 (65 FE) MG tablet Take 1 tablet (325 mg total) by mouth daily with breakfast. 30 tablet 3  . levothyroxine (SYNTHROID, LEVOTHROID) 25 MCG tablet Take 1.5 tablets (37.5 mcg total) by mouth daily before breakfast. 135 tablet 4  . omeprazole (PRILOSEC) 20 MG capsule Take one capsule by mouth once daily to reduce stomach acid 90 capsule 3  . simvastatin (ZOCOR) 20 MG tablet Take one tablet by mouth once daily for cholesterol 90 tablet 3  . calcium carbonate (OS-CAL) 600 MG TABS Take 600 mg by mouth 2 (two) times daily with a meal. Take one tablet twice a day for calcium  supplement    . conjugated estrogens (PREMARIN) vaginal cream 1/2 gram vaginally every night for 2 weeks prior to your follow up appointment in March (Patient not taking: Reported on 09/20/2016) 30 g 0   No current facility-administered medications for this visit.      ALLERGIES: Prednisolone  Family History  Problem Relation Age of Onset  . Alport syndrome Brother     Social History   Social History  . Marital status: Married    Spouse name: N/A  . Number of children: N/A  . Years of education: N/A   Occupational History  . Not on file.   Social History Main Topics  . Smoking status: Never Smoker  . Smokeless tobacco: Never Used  . Alcohol use No  . Drug use: No  . Sexual activity: Yes    Birth control/ protection: Post-menopausal   Other Topics Concern  . Not on file   Social History Narrative  . No narrative on file    Review of Systems  Constitutional: Negative.   HENT: Negative.   Eyes: Negative.   Respiratory: Negative.   Cardiovascular: Negative.   Gastrointestinal: Negative.   Genitourinary: Negative.   Musculoskeletal: Negative.   Skin: Negative.   Neurological: Negative.   Endo/Heme/Allergies: Negative.   Psychiatric/Behavioral: Negative.     PHYSICAL EXAMINATION:    BP 110/70 (BP Location: Right Arm, Patient Position: Sitting, Cuff Size: Normal)   Pulse 84   Resp 16   Wt 129 lb (58.5 kg)   BMI 27.92 kg/m     General appearance: alert, cooperative and appears stated age   ASSESSMENT VAIN II    PLAN Discussed VAIN with the patient her daughter and the interpreter. Discussed the possibility of underlying cancer, discussed the risk of progression to cancer and the need for treatment. Discussed the option of removal of the dysplasia (would be likely vaginectomy), option of laser ablation or the option of treatment with 5Fu Discussed the risk of irritation from the 5 FU Discussed the risk of recurrence of the dysplasia, and possible need  for further treatment in the future Explained that she will need to have pap's every 6 months for 1-2 years, then yearly (if paps are normal) She would like to use the 5 FU, will treat with 2 grams vaginally 1 x a week at hs for 10 weeks. Will have her come in for an exam prior to the 3rd treatment to make sure she isn't getting too irritated from the F-5U. Discussed the need for using Vaseline or Zinc Oxide externally when using the 5 FU to avoid irritation of the vulva.  All of her questions were answered    An After Visit Summary was printed and given to the patient.  30 minutes face to face time of which over 50% was spent in counseling.

## 2016-09-21 ENCOUNTER — Telehealth: Payer: Self-pay

## 2016-09-21 MED ORDER — NONFORMULARY OR COMPOUNDED ITEM: 0 refills | Status: DC

## 2016-09-21 NOTE — Telephone Encounter (Signed)
Rx for 2 grams of 5FU vaginally at bedtime once per week x 10 weeks with note that patient will need vaginal applicators printed and to Dr.Silva for review as Dr.Jertson is out of the office today.

## 2016-09-21 NOTE — Telephone Encounter (Signed)
-----   Message from Romualdo BolkJill Evelyn Jertson, MD sent at 09/20/2016  2:59 PM EDT ----- Please see the plan and order the 5 FU as written. When it is in please notify her son. She will need to see me prior to her 3rd dose. She may not start the cream for a week or two, her husband is recovering from knee surgery.

## 2016-09-21 NOTE — Telephone Encounter (Signed)
Patient son returning your call.  °

## 2016-09-21 NOTE — Telephone Encounter (Signed)
Spoke with patients son "Elby ShowersRaj", provided contact information and address for [facility]. Son verbalizes understanding and is agreeable.  Routing to provider for final review. Patient is agreeable to disposition. Will close encounter.

## 2016-09-21 NOTE — Telephone Encounter (Signed)
Spoke with patient's son Elby ShowersRaj advised [facility] need to [facility] so that applicators can be obtained for medication usage. Raj verbalizes understanding. Would like to return call later this morning so that he can write down the address [facility].

## 2016-09-25 DIAGNOSIS — M545 Low back pain: Secondary | ICD-10-CM | POA: Diagnosis not present

## 2016-09-25 DIAGNOSIS — M5416 Radiculopathy, lumbar region: Secondary | ICD-10-CM | POA: Diagnosis not present

## 2016-09-25 DIAGNOSIS — M48061 Spinal stenosis, lumbar region without neurogenic claudication: Secondary | ICD-10-CM | POA: Diagnosis not present

## 2016-09-28 ENCOUNTER — Telehealth: Payer: Self-pay

## 2016-09-28 NOTE — Telephone Encounter (Signed)
Spoke with [facility]Sarah at [facility] who has a few questions regarding Fluorouracil rx. States the patient's husband is wanting the rx to go through insurance and the only way this can occur is to fill the medication as the already manufactured medication which will not have applicators. If compounded applicators will be available. Advised will need applicators so this can be used vaginally. Want to ensure the patient is getting the right amount of medication. Maralyn SagoSarah is agreeable. Asking if the medication can be dispensed as 8 grams with refills. Advised this is okay as long as the patient is aware it is a one month supply and will need to refill to continue use. Maralyn SagoSarah is agreeable and will contact the patient's husband to review. Medication will be compounded on Monday per Sarah. Will keep this encounter open as the patient needs to be scheduled for 3 week recheck once starting medication.

## 2016-10-02 ENCOUNTER — Encounter: Payer: Self-pay | Admitting: Internal Medicine

## 2016-10-02 NOTE — Telephone Encounter (Signed)
Spoke with patient's son Elby ShowersRaj, okay per ROI. Elby ShowersRaj requests a call back tomorrow to discuss patient's medication pick up and recheck as the patient will be with him tomorrow so he can speak with her as well. Advised will return call tomorrow to discuss.

## 2016-10-04 ENCOUNTER — Ambulatory Visit: Payer: Medicare Other | Attending: Orthopedic Surgery | Admitting: Physical Therapy

## 2016-10-04 ENCOUNTER — Encounter: Payer: Self-pay | Admitting: Physical Therapy

## 2016-10-04 DIAGNOSIS — M5442 Lumbago with sciatica, left side: Secondary | ICD-10-CM | POA: Insufficient documentation

## 2016-10-04 DIAGNOSIS — G8929 Other chronic pain: Secondary | ICD-10-CM | POA: Diagnosis not present

## 2016-10-04 DIAGNOSIS — M62830 Muscle spasm of back: Secondary | ICD-10-CM

## 2016-10-04 NOTE — Therapy (Signed)
[facility] [place] [address] [place], [place], [address] Phone: [phone]   Fax:  [phone]  Physical Therapy Evaluation  Patient Details  Name: [name] COLETTA MRN: [id number] Date of Birth: 12-27-1945 Referring Provider: Mckinley Jewel  Encounter Date: 10/04/2016      PT End of Session - 10/04/16 1442    Visit Number 1   Date for PT Re-Evaluation 12/04/16   PT Start Time 1350   PT Stop Time 1500   PT Time Calculation (min) 70 min   Activity Tolerance Patient tolerated treatment well   Behavior During Therapy [facility] for tasks assessed/performed      Past Medical History:  Diagnosis Date  . Acute bronchiolitis due to other infectious organisms   . Acute bronchiolitis due to respiratory syncytial virus (RSV)   . Arthritis    OA AND PAIN RIGHT KNEE  . Degeneration of intervertebral disc, site unspecified   . Disorder of bone and cartilage, unspecified   . Disturbance of skin sensation   . Encounter for long-term (current) use of other medications   . GERD (gastroesophageal reflux disease)    PT STATES SEVERE REFLUX IF SHE DOES NOT TAKE HER OMEPRAZOLE  . Headache(784.0)    WHEN REFLUX SEVERE  . Hypothyroidism   . Long term (current) use of anticoagulants   . Osteoarthrosis, unspecified whether generalized or localized, lower leg   . Osteoporosis, unspecified   . Other and unspecified hyperlipidemia   . Other atopic dermatitis and related conditions   . Other malaise and fatigue   . Other specified pre-operative examination 11/07/2011  . Thoracic or lumbosacral neuritis or radiculitis, unspecified   . Unspecified vitamin D deficiency   . Vegetarian diet     Past Surgical History:  Procedure Laterality Date  . CERVICAL BIOPSY  W/ LOOP ELECTRODE EXCISION    . EYE SURGERY  2016   Left eye, Cataract Removal  . TEETH EXTRACTIONS--NO OTHER SURGERY    . TOTAL KNEE ARTHROPLASTY  11/28/2011   Procedure: TOTAL KNEE  ARTHROPLASTY;  Surgeon: Jacki Cones, MD;  Location: [facility];  Service: Orthopedics;  Laterality: Right;  . TUBAL LIGATION      There were no vitals filed for this visit.       Subjective Assessment - 10/04/16 1359    Subjective Patient saw the MD and had an MRI, the MRI shows stenosis at L5-S1 and some disc bulging and stenosis at L4-5.  She only has pain with lying down especially if she is on her back, if she is on her side no pain, that runs down the left lateral thigh, she does have pain in the back with ADL's   Limitations House hold activities   Patient Stated Goals have no pain   Currently in Pain? Yes   Pain Score 5    Pain Location Back   Pain Orientation Right;Lower   Pain Descriptors / Indicators Aching;Sore;Shooting   Pain Type Chronic pain   Pain Radiating Towards pain down the left lateral thigh   Pain Onset More than a month ago   Pain Frequency Constant   Aggravating Factors  has pain in the right low back a 4-5/10 with ADL's, pain was shooting down the left lateral thigh when lying down on her back the thigh pain is 7-8/10   Pain Relieving Factors can lie on side and maybe a little better   Effect of Pain on Daily Activities limits sleep and rest,  limits ADL's            [facility] PT Assessment - 10/04/16 0001      Assessment   Medical Diagnosis Low back pain with DDD, stenosis and disc bulging   Referring Provider Mckinley Jewel   Onset Date/Surgical Date 09/20/16   Prior Therapy yes for the left ITB     Precautions   Precautions None     Balance Screen   Has the patient fallen in the past 6 months No   Has the patient had a decrease in activity level because of a fear of falling?  No   Is the patient reluctant to leave their home because of a fear of falling?  No     Home Environment   Additional Comments has stairs, does housework     Prior Function   Level of Independence Independent   Vocation Retired   Leisure some walking in the past      AROM   Overall AROM Comments Lumbar ROM decreased 25% for flexion and side bending with some pain in the right low back, extension was decreased 75% with pain i nthe low back      Strength   Overall Strength Comments 4-/5 for the hips with some pain in the low back     Flexibility   Soft Tissue Assessment /Muscle Length --  mild piriformis tightness     Palpation   Palpation comment tender right SI area, right lumbar and right buttock            Objective measurements completed on examination: See above findings.          [facility] Adult PT Treatment/Exercise - 10/04/16 0001      Moist Heat Therapy   Number Minutes Moist Heat 15 Minutes   Moist Heat Location Lumbar Spine     Electrical Stimulation   Electrical Stimulation Location right SI area   Electrical Stimulation Action IFC   Electrical Stimulation Parameters right sidelying   Electrical Stimulation Goals Pain                PT Education - 10/04/16 1441    Education provided Yes   Education Details Wms flexion exercises, demonstrated sheet traction   Person(s) Educated Patient   Methods Explanation;Demonstration;Handout   Comprehension Verbalized understanding          PT Short Term Goals - 10/04/16 1524      PT SHORT TERM GOAL #1   Title independent with initial HEP   Time 2   Period Weeks   Status New           PT Long Term Goals - 10/04/16 1524      PT LONG TERM GOAL #1   Title decrease pain 50%   Time 8   Period Weeks   Status New     PT LONG TERM GOAL #2   Title increase lumbar ROM 25%   Time 8   Period Weeks   Status New     PT LONG TERM GOAL #3   Title go up and down stairs step over step   Time 8   Period Weeks   Status New     PT LONG TERM GOAL #4   Title report sleep 50% better   Time 8   Period Weeks   Status New                Plan - 10/04/16 1442    Clinical Impression Statement  Patient has right low back pain as well as left lateral thigh  pain.  She had an MRI that showed L4-5 disc bulging, L5-S1 stenosis, the stenosis was at both levels.  She was treated here in February for left ITB pain.  She is not tender in this area today.  She has tenderness and pain in the right low back most times, when lying on her back she has shooting pain in th eleft lateral thigh rated a 7-8/10.  She can lie on her right side and the pain in the thigh will decrease.     Clinical Presentation Evolving   Clinical Decision Making Moderate   Rehab Potential Good   PT Frequency 2x / week   PT Duration 8 weeks   PT Treatment/Interventions Cryotherapy;Electrical Stimulation;Iontophoresis 4mg /ml Dexamethasone;Moist Heat;Ultrasound;Gait training;Functional mobility training;Patient/family education;Balance training;Therapeutic exercise;Therapeutic activities;Manual techniques;Taping;Vasopneumatic Device;Traction   PT Next Visit Plan try traction, assure she is doing HEP correctly   Consulted and Agree with Plan of Care Patient      Patient will benefit from skilled therapeutic intervention in order to improve the following deficits and impairments:  Abnormal gait, Decreased mobility, Decreased range of motion, Decreased strength, Difficulty walking, Impaired flexibility, Pain  Visit Diagnosis: Chronic right-sided low back pain with left-sided sciatica - Plan: PT plan of care cert/re-cert  Muscle spasm of back - Plan: PT plan of care cert/re-cert      G-Codes - 10/04/16 1559    Functional Assessment Tool Used (Outpatient Only) Foto 55% limitation   Functional Limitation Mobility: Walking and moving around   Mobility: Walking and Moving Around Current Status [phone]([id number]) At least 40 percent but less than 60 percent impaired, limited or restricted   Mobility: Walking and Moving Around Goal Status [phone](G8979) At least 20 percent but less than 40 percent impaired, limited or restricted       Problem List Patient Active Problem List   Diagnosis Date Noted  .  Vaginal dysplasia 09/20/2016  . Muscle spasm of left lower extremity 05/11/2016  . Swelling of joint of left knee 05/11/2016  . Dysplasia of cervix, low grade (CIN 1) 05/11/2016  . High risk medication use 05/11/2016  . Thyroid activity decreased 11/19/2014  . Pain, joint, multiple sites 11/19/2014  . Numbness and tingling of left leg 04/06/2014  . Routine general medical examination at a health care facility 09/29/2013  . Osteoporosis, post-menopausal 04/21/2013  . Cough variant asthma 03/17/2013  . Psoriasis 03/17/2013  . Persistent dry cough 03/17/2013  . Osteoarthritis of left knee 03/03/2013  . Psoriasis-like skin disease 03/03/2013  . Eczema 01/07/2013  . Need for prophylactic vaccination and inoculation against influenza 01/07/2013  . Prediabetes 12/17/2012  . Cough 12/17/2012  . Reactive airway disease with wheezing 12/03/2012  . URI, acute 12/03/2012  . Hypothyroidism 10/29/2012  . Unspecified vitamin D deficiency 10/29/2012  . Hyperlipidemia LDL goal <130 10/29/2012  . GERD (gastroesophageal reflux disease) 10/29/2012  . Osteoporosis 10/29/2012  . Myalgia and myositis 10/29/2012    Jearld LeschALBRIGHT,Avyukth Bontempo W., PT 10/04/2016, 4:02 PM  [facility] [facility] [address] [place], [place], [address] Phone: [phone]   Fax:  [phone]  Name: Eston MouldSudhaben M Fite MRN: [id number] Date of Birth: 06/08/1945

## 2016-10-08 ENCOUNTER — Ambulatory Visit: Payer: Medicare Other | Attending: Orthopedic Surgery | Admitting: Physical Therapy

## 2016-10-08 ENCOUNTER — Encounter: Payer: Self-pay | Admitting: Physical Therapy

## 2016-10-08 DIAGNOSIS — G8929 Other chronic pain: Secondary | ICD-10-CM | POA: Diagnosis not present

## 2016-10-08 DIAGNOSIS — M62830 Muscle spasm of back: Secondary | ICD-10-CM | POA: Insufficient documentation

## 2016-10-08 DIAGNOSIS — M5442 Lumbago with sciatica, left side: Secondary | ICD-10-CM | POA: Insufficient documentation

## 2016-10-08 DIAGNOSIS — M25552 Pain in left hip: Secondary | ICD-10-CM | POA: Insufficient documentation

## 2016-10-08 DIAGNOSIS — M7632 Iliotibial band syndrome, left leg: Secondary | ICD-10-CM | POA: Diagnosis not present

## 2016-10-08 NOTE — Telephone Encounter (Signed)
[name] ShowersRaj returned call and the patient is scheduled for 3 week recheck on 10/15/2016.  Routing to provider for final review. Patient agreeable to disposition. Will close encounter.

## 2016-10-08 NOTE — Therapy (Signed)
[facility] [address] [place], [place], [address] Phone: [phone]   Fax:  [phone]  Physical Therapy Treatment  Patient Details  Name: [name] MouldSudhaben M [name] MRN: [id number] Date of Birth: 05/31/1945 Referring Provider: Mckinley Jewelaniel [name]  Encounter Date: 10/08/2016      PT End of Session - 10/08/16 1548    Visit Number 2   Date for PT Re-Evaluation 12/04/16   PT Start Time 1515   PT Stop Time 1600   PT Time Calculation (min) 45 min   Activity Tolerance Patient tolerated treatment well   Behavior During Therapy [facility] for tasks assessed/performed      Past Medical History:  Diagnosis Date  . Acute bronchiolitis due to other infectious organisms   . Acute bronchiolitis due to respiratory syncytial virus (RSV)   . Arthritis    OA AND PAIN RIGHT KNEE  . Degeneration of intervertebral disc, site unspecified   . Disorder of bone and cartilage, unspecified   . Disturbance of skin sensation   . Encounter for long-term (current) use of other medications   . GERD (gastroesophageal reflux disease)    PT STATES SEVERE REFLUX IF SHE DOES NOT TAKE HER OMEPRAZOLE  . Headache(784.0)    WHEN REFLUX SEVERE  . Hypothyroidism   . Long term (current) use of anticoagulants   . Osteoarthrosis, unspecified whether generalized or localized, lower leg   . Osteoporosis, unspecified   . Other and unspecified hyperlipidemia   . Other atopic dermatitis and related conditions   . Other malaise and fatigue   . Other specified pre-operative examination 11/07/2011  . Thoracic or lumbosacral neuritis or radiculitis, unspecified   . Unspecified vitamin D deficiency   . Vegetarian diet     Past Surgical History:  Procedure Laterality Date  . CERVICAL BIOPSY  W/ LOOP ELECTRODE EXCISION    . EYE SURGERY  2016   Left eye, Cataract Removal  . TEETH EXTRACTIONS--NO OTHER SURGERY    . TOTAL KNEE ARTHROPLASTY  11/28/2011   Procedure: TOTAL KNEE  ARTHROPLASTY;  Surgeon: [name] Conesonald A Gioffre, MD;  Location[facility];  Service: Orthopedics;  Laterality: Right;  . TUBAL LIGATION      There were no vitals filed for this visit.      Subjective Assessment - 10/08/16 1512    Subjective Pt reports that her HEP helped a little but not much.   Currently in Pain? No/denies   Pain Score 0-No pain                         [facility] Adult PT Treatment/Exercise - 10/08/16 0001      Lumbar Exercises: Standing   Row Theraband;10 reps   Theraband Level (Row) Level 2 (Red)   Shoulder Extension 10 reps;Right;Theraband  x2   Theraband Level (Shoulder Extension) Level 2 (Red)   Other Standing Lumbar Exercises trunt rotations red ball 2x10; Overhead ext red ball 2x10      Knee/Hip Exercises: Aerobic   Nustep level 3 x 6 minutes     Modalities   Modalities Traction     Traction   Type of Traction Lumbar   Max (lbs) 55   Hold Time 15                  PT Short Term Goals - 10/04/16 1524      PT SHORT TERM GOAL #1   Title independent with initial HEP  Time 2   Period Weeks   Status New           PT Long Term Goals - 10/04/16 1524      PT LONG TERM GOAL #1   Title decrease pain 50%   Time 8   Period Weeks   Status New     PT LONG TERM GOAL #2   Title increase lumbar ROM 25%   Time 8   Period Weeks   Status New     PT LONG TERM GOAL #3   Title go up and down stairs step over step   Time 8   Period Weeks   Status New     PT LONG TERM GOAL #4   Title report sleep 50% better   Time 8   Period Weeks   Status New               Plan - 10/08/16 1550    Clinical Impression Statement Pt reports a running sensation down her lateral L thigh while in supine position. does reports the sensation with standing trunk rotation. Completed all other exercises well. Does require cues to maintain posture with shoulder extensions. Reports that the sheet traction provided some relief.   Rehab Potential Good    PT Frequency 2x / week   PT Duration 8 weeks   PT Treatment/Interventions ADLs/Self Care Home Management   PT Next Visit Plan assess traction, add postural strengthening      Patient will benefit from skilled therapeutic intervention in order to improve the following deficits and impairments:  Abnormal gait, Decreased mobility, Decreased range of motion, Decreased strength, Difficulty walking, Impaired flexibility, Pain  Visit Diagnosis: Chronic right-sided low back pain with left-sided sciatica  Pain in left hip  Muscle spasm of back     Problem List Patient Active Problem List   Diagnosis Date Noted  . Vaginal dysplasia 09/20/2016  . Muscle spasm of left lower extremity 05/11/2016  . Swelling of joint of left knee 05/11/2016  . Dysplasia of cervix, low grade (CIN 1) 05/11/2016  . High risk medication use 05/11/2016  . Thyroid activity decreased 11/19/2014  . Pain, joint, multiple sites 11/19/2014  . Numbness and tingling of left leg 04/06/2014  . Routine general medical examination at a health care facility 09/29/2013  . Osteoporosis, post-menopausal 04/21/2013  . Cough variant asthma 03/17/2013  . Psoriasis 03/17/2013  . Persistent dry cough 03/17/2013  . Osteoarthritis of left knee 03/03/2013  . Psoriasis-like skin disease 03/03/2013  . Eczema 01/07/2013  . Need for prophylactic vaccination and inoculation against influenza 01/07/2013  . Prediabetes 12/17/2012  . Cough 12/17/2012  . Reactive airway disease with wheezing 12/03/2012  . URI, acute 12/03/2012  . Hypothyroidism 10/29/2012  . Unspecified vitamin D deficiency 10/29/2012  . Hyperlipidemia LDL goal <130 10/29/2012  . GERD (gastroesophageal reflux disease) 10/29/2012  . Osteoporosis 10/29/2012  . Myalgia and myositis 10/29/2012    Grayce Sessions, PTA 10/08/2016, 3:53 PM  [facility] [place] [address] [place], [place], [address] Phone:  [phone]   Fax:  [phone]  Name: [name] [name] MRN: [id number] Date of Birth: Jan 04, 1946

## 2016-10-11 ENCOUNTER — Encounter: Payer: Self-pay | Admitting: Physical Therapy

## 2016-10-11 ENCOUNTER — Ambulatory Visit: Payer: Medicare Other | Admitting: Physical Therapy

## 2016-10-11 DIAGNOSIS — M62830 Muscle spasm of back: Secondary | ICD-10-CM

## 2016-10-11 DIAGNOSIS — M25552 Pain in left hip: Secondary | ICD-10-CM

## 2016-10-11 DIAGNOSIS — G8929 Other chronic pain: Secondary | ICD-10-CM | POA: Diagnosis not present

## 2016-10-11 DIAGNOSIS — M5442 Lumbago with sciatica, left side: Principal | ICD-10-CM

## 2016-10-11 DIAGNOSIS — M7632 Iliotibial band syndrome, left leg: Secondary | ICD-10-CM | POA: Diagnosis not present

## 2016-10-11 NOTE — Therapy (Signed)
[place], [place], [address] Phone: [phone]   Fax:  [phone]  Physical Therapy Treatment  Patient Details  Name: [name] [name] MRN: [id number] Date of Birth: Feb 12, 1946 Referring Provider: Kathryne Hitch  Encounter Date: 10/11/2016      PT End of Session - 10/11/16 1426    Visit Number 3   PT Start Time 4917   PT Stop Time 1440   PT Time Calculation (min) 55 min   Behavior During Therapy [facility] for tasks assessed/performed      Past Medical History:  Diagnosis Date  . Acute bronchiolitis due to other infectious organisms   . Acute bronchiolitis due to respiratory syncytial virus (RSV)   . Arthritis    OA AND PAIN RIGHT KNEE  . Degeneration of intervertebral disc, site unspecified   . Disorder of bone and cartilage, unspecified   . Disturbance of skin sensation   . Encounter for long-term (current) use of other medications   . GERD (gastroesophageal reflux disease)    PT STATES SEVERE REFLUX IF SHE DOES NOT TAKE HER OMEPRAZOLE  . Headache(784.0)    WHEN REFLUX SEVERE  . Hypothyroidism   . Long term (current) use of anticoagulants   . Osteoarthrosis, unspecified whether generalized or localized, lower leg   . Osteoporosis, unspecified   . Other and unspecified hyperlipidemia   . Other atopic dermatitis and related conditions   . Other malaise and fatigue   . Other specified pre-operative examination 11/07/2011  . Thoracic or lumbosacral neuritis or radiculitis, unspecified   . Unspecified vitamin D deficiency   . Vegetarian diet     Past Surgical History:  Procedure Laterality Date  . CERVICAL BIOPSY  W/ LOOP ELECTRODE EXCISION    . EYE SURGERY  2016   Left eye, Cataract Removal  . TEETH EXTRACTIONS--NO OTHER SURGERY    . TOTAL KNEE ARTHROPLASTY  11/28/2011   Procedure: TOTAL KNEE ARTHROPLASTY;  Surgeon: Tobi Bastos, MD;  Location: [facility];  Service: Orthopedics;   Laterality: Right;  . TUBAL LIGATION      There were no vitals filed for this visit.      Subjective Assessment - 10/11/16 1352    Subjective Pt continue to reports running sensation down her lateral L thigh when supine.    Currently in Pain? Yes   Pain Score 5    Pain Location Back   Pain Orientation Lower                         [facility] Adult PT Treatment/Exercise - 10/11/16 0001      Lumbar Exercises: Stretches   Passive Hamstring Stretch 3 reps;10 seconds   Lower Trunk Rotation 3 reps;10 seconds   ITB Stretch 3 reps;10 seconds   Piriformis Stretch 3 reps;10 seconds     Lumbar Exercises: Standing   Row Theraband;15 reps;Both   Theraband Level (Row) Level 2 (Red)   Shoulder Extension Right;Theraband;15 reps  x2   Theraband Level (Shoulder Extension) Level 2 (Red)     Lumbar Exercises: Supine   Bridge 10 reps;2 seconds  x2   Straight Leg Raise 10 reps;2 seconds  x2   Straight Leg Raises Limitations 2     Knee/Hip Exercises: Aerobic   Nustep level 3 x 6 minutes     Traction   Type of Traction Lumbar   Max (lbs) 60   Time 15  PT Short Term Goals - 10/11/16 1427      PT SHORT TERM GOAL #1   Title independent with initial HEP   Status Achieved           PT Long Term Goals - 10/11/16 1427      PT LONG TERM GOAL #1   Title decrease pain 50%   Status On-going     PT LONG TERM GOAL #2   Title increase lumbar ROM 25%   Status On-going     PT LONG TERM GOAL #4   Title report sleep 50% better   Status On-going               Plan - 10/11/16 1427    Clinical Impression Statement STG met, repots HEP helped a little. No issues with today's interventions, cues needed to relax during LE stretching. Pt reports traction helps.   Rehab Potential Good   PT Frequency 2x / week   PT Duration 8 weeks   PT Treatment/Interventions ADLs/Self Care Home Management   PT Next Visit Plan assess traction, add postural  strengthening      Patient will benefit from skilled therapeutic intervention in order to improve the following deficits and impairments:  Abnormal gait, Decreased mobility, Decreased range of motion, Decreased strength, Difficulty walking, Impaired flexibility, Pain  Visit Diagnosis: Chronic right-sided low back pain with left-sided sciatica  Pain in left hip  Muscle spasm of back     Problem List Patient Active Problem List   Diagnosis Date Noted  . Vaginal dysplasia 09/20/2016  . Muscle spasm of left lower extremity 05/11/2016  . Swelling of joint of left knee 05/11/2016  . Dysplasia of cervix, low grade (CIN 1) 05/11/2016  . High risk medication use 05/11/2016  . Thyroid activity decreased 11/19/2014  . Pain, joint, multiple sites 11/19/2014  . Numbness and tingling of left leg 04/06/2014  . Routine general medical examination at a health care facility 09/29/2013  . Osteoporosis, post-menopausal 04/21/2013  . Cough variant asthma 03/17/2013  . Psoriasis 03/17/2013  . Persistent dry cough 03/17/2013  . Osteoarthritis of left knee 03/03/2013  . Psoriasis-like skin disease 03/03/2013  . Eczema 01/07/2013  . Need for prophylactic vaccination and inoculation against influenza 01/07/2013  . Prediabetes 12/17/2012  . Cough 12/17/2012  . Reactive airway disease with wheezing 12/03/2012  . URI, acute 12/03/2012  . Hypothyroidism 10/29/2012  . Unspecified vitamin D deficiency 10/29/2012  . Hyperlipidemia LDL goal <130 10/29/2012  . GERD (gastroesophageal reflux disease) 10/29/2012  . Osteoporosis 10/29/2012  . Myalgia and myositis 10/29/2012    Scot Jun, PTA 10/11/2016, 2:29 PM  [place], [place], [address] Phone: [phone]   Fax:  [phone]  Name: [name] [name] MRN: [id number] Date of Birth: 08/18/1945

## 2016-10-12 ENCOUNTER — Ambulatory Visit: Payer: Medicare Other | Admitting: Physical Therapy

## 2016-10-15 ENCOUNTER — Ambulatory Visit (INDEPENDENT_AMBULATORY_CARE_PROVIDER_SITE_OTHER): Payer: Medicare Other | Admitting: Obstetrics and Gynecology

## 2016-10-15 ENCOUNTER — Encounter: Payer: Self-pay | Admitting: Obstetrics and Gynecology

## 2016-10-15 VITALS — BP 138/80 | HR 92 | Resp 16 | Wt 131.0 lb

## 2016-10-15 DIAGNOSIS — N893 Dysplasia of vagina, unspecified: Secondary | ICD-10-CM | POA: Diagnosis not present

## 2016-10-15 DIAGNOSIS — N898 Other specified noninflammatory disorders of vagina: Secondary | ICD-10-CM | POA: Diagnosis not present

## 2016-10-15 NOTE — Progress Notes (Signed)
GYNECOLOGY  VISIT   HPI: 71 y.o.   Married  [place]  female   [phone] with No LMP recorded. Patient is postmenopausal.   here for follow up medication. Patient is using 5 FU. Patient c/o of slight vaginal burning  The patient has VAIN and is here for f/u after 2 doses of 5FU She c/o vulvar burning with urination externally. She has been burning for the last 2 days.  Last used the medication the last 2 Mondays, due to use it tonight.   GYNECOLOGIC HISTORY: No LMP recorded. Patient is postmenopausal. Contraception:postmenopause Menopausal hormone therapy: none        OB History    Gravida Para Term Preterm AB Living   3 3 3     3    SAB TAB Ectopic Multiple Live Births           3         Patient Active Problem List   Diagnosis Date Noted  . Vaginal dysplasia 09/20/2016  . Muscle spasm of left lower extremity 05/11/2016  . Swelling of joint of left knee 05/11/2016  . Dysplasia of cervix, low grade (CIN 1) 05/11/2016  . High risk medication use 05/11/2016  . Thyroid activity decreased 11/19/2014  . Pain, joint, multiple sites 11/19/2014  . Numbness and tingling of left leg 04/06/2014  . Routine general medical examination at a health care facility 09/29/2013  . Osteoporosis, post-menopausal 04/21/2013  . Cough variant asthma 03/17/2013  . Psoriasis 03/17/2013  . Persistent dry cough 03/17/2013  . Osteoarthritis of left knee 03/03/2013  . Psoriasis-like skin disease 03/03/2013  . Eczema 01/07/2013  . Need for prophylactic vaccination and inoculation against influenza 01/07/2013  . Prediabetes 12/17/2012  . Cough 12/17/2012  . Reactive airway disease with wheezing 12/03/2012  . URI, acute 12/03/2012  . Hypothyroidism 10/29/2012  . Unspecified vitamin D deficiency 10/29/2012  . Hyperlipidemia LDL goal <130 10/29/2012  . GERD (gastroesophageal reflux disease) 10/29/2012  . Osteoporosis 10/29/2012  . Myalgia and myositis 10/29/2012    Past Medical History:  Diagnosis Date   . Acute bronchiolitis due to other infectious organisms   . Acute bronchiolitis due to respiratory syncytial virus (RSV)   . Arthritis    OA AND PAIN RIGHT KNEE  . Degeneration of intervertebral disc, site unspecified   . Disorder of bone and cartilage, unspecified   . Disturbance of skin sensation   . Encounter for long-term (current) use of other medications   . GERD (gastroesophageal reflux disease)    PT STATES SEVERE REFLUX IF SHE DOES NOT TAKE HER OMEPRAZOLE  . Headache(784.0)    WHEN REFLUX SEVERE  . Hypothyroidism   . Long term (current) use of anticoagulants   . Osteoarthrosis, unspecified whether generalized or localized, lower leg   . Osteoporosis, unspecified   . Other and unspecified hyperlipidemia   . Other atopic dermatitis and related conditions   . Other malaise and fatigue   . Other specified pre-operative examination 11/07/2011  . Thoracic or lumbosacral neuritis or radiculitis, unspecified   . Unspecified vitamin D deficiency   . Vegetarian diet     Past Surgical History:  Procedure Laterality Date  . CERVICAL BIOPSY  W/ LOOP ELECTRODE EXCISION    . EYE SURGERY  2016   Left eye, Cataract Removal  . TEETH EXTRACTIONS--NO OTHER SURGERY    . TOTAL KNEE ARTHROPLASTY  11/28/2011   Procedure: TOTAL KNEE ARTHROPLASTY;  Surgeon: Jacki Cones, MD;  Location: [facility];  Service:  Orthopedics;  Laterality: Right;  . TUBAL LIGATION      Current Outpatient Prescriptions  Medication Sig Dispense Refill  . aspirin 81 MG tablet Take 81 mg by mouth daily.    . calcium carbonate (OS-CAL) 600 MG TABS Take 600 mg by mouth 2 (two) times daily with a meal. Take one tablet twice a day for calcium supplement    . ferrous sulfate 325 (65 FE) MG tablet Take 1 tablet (325 mg total) by mouth daily with breakfast. 30 tablet 3  . levothyroxine (SYNTHROID, LEVOTHROID) 25 MCG tablet Take 1.5 tablets (37.5 mcg total) by mouth daily before breakfast. 135 tablet 4  . NONFORMULARY OR  COMPOUNDED ITEM Fluorouracil 5 % cream (5 FU) place 2 grams vaginally at bedtime once per week for 10 weeks. Patient will need vaginal applicators. Dispense 20 grams 0RF. 1 each 0  . omeprazole (PRILOSEC) 20 MG capsule Take one capsule by mouth once daily to reduce stomach acid 90 capsule 3  . simvastatin (ZOCOR) 20 MG tablet Take one tablet by mouth once daily for cholesterol 90 tablet 3   No current facility-administered medications for this visit.      ALLERGIES: Prednisolone  Family History  Problem Relation Age of Onset  . Alport syndrome Brother     Social History   Social History  . Marital status: Married    Spouse name: N/A  . Number of children: N/A  . Years of education: N/A   Occupational History  . Not on file.   Social History Main Topics  . Smoking status: Never Smoker  . Smokeless tobacco: Never Used  . Alcohol use No  . Drug use: No  . Sexual activity: Yes    Birth control/ protection: Post-menopausal   Other Topics Concern  . Not on file   Social History Narrative  . No narrative on file    Review of Systems  Constitutional: Negative.   HENT: Negative.   Eyes: Negative.   Respiratory: Negative.   Cardiovascular: Negative.   Gastrointestinal: Negative.   Genitourinary:       Vaginal burning   Musculoskeletal: Negative.   Skin: Negative.   Neurological: Negative.   Endo/Heme/Allergies: Negative.   Psychiatric/Behavioral: Negative.     PHYSICAL EXAMINATION:    BP 138/80 (BP Location: Right Arm, Patient Position: Sitting, Cuff Size: Normal)   Pulse 92   Resp 16   Wt 131 lb (59.4 kg)   BMI 28.35 kg/m     General appearance: alert, cooperative and appears stated age  Pelvic: External genitalia:  She has irritation just at the opening of her vagina laterally on both sides, tissue appears eroded               Urethra:  normal appearing urethra with no masses, tenderness or lesions              Bartholins and Skenes: normal                  Vagina: normal appearing atrophic vagina with normal color, no discharge, no lesions or irritation              Cervix: no lesions and flush with the vagina.                 Chaperone was present for exam.  ASSESSMENT Vaginal dysplasia, s/p 5 FU x 2. She was given a one month supply by the pharmacy, has used 2 doses and states she is almost out  of medication Irritation at the opening of her vagina    PLAN Discussed proper dosing with the patient, her daughter and the interpreter She will hold on the dose for tonight F/U in one week, I will show her daughter how to help her with the medication Will have the nurse call the pharmacy and explain the issue with dosing.   An After Visit Summary was printed and given to the patient.  10 minutes face to face time of which over 50% was spent in counseling.

## 2016-10-16 ENCOUNTER — Encounter: Payer: Self-pay | Admitting: Physical Therapy

## 2016-10-16 ENCOUNTER — Telehealth: Payer: Self-pay

## 2016-10-16 ENCOUNTER — Ambulatory Visit: Payer: Medicare Other | Admitting: Physical Therapy

## 2016-10-16 DIAGNOSIS — G8929 Other chronic pain: Secondary | ICD-10-CM

## 2016-10-16 DIAGNOSIS — M62830 Muscle spasm of back: Secondary | ICD-10-CM

## 2016-10-16 DIAGNOSIS — M25552 Pain in left hip: Secondary | ICD-10-CM | POA: Diagnosis not present

## 2016-10-16 DIAGNOSIS — M7632 Iliotibial band syndrome, left leg: Secondary | ICD-10-CM | POA: Diagnosis not present

## 2016-10-16 DIAGNOSIS — M5442 Lumbago with sciatica, left side: Principal | ICD-10-CM

## 2016-10-16 NOTE — Therapy (Signed)
[facility] [address] [place], [place], [address] Phone: [phone]   Fax:  [phone]  Physical Therapy Treatment  Patient Details  Name: [name] [name] MRN: [id number] Date of Birth: 1945-10-13 Referring Provider: Mckinley Jewel  Encounter Date: 10/16/2016      PT End of Session - 10/16/16 1435    Visit Number 4   Date for PT Re-Evaluation 12/04/16   PT Start Time 1345   PT Stop Time 1447   PT Time Calculation (min) 62 min   Activity Tolerance Patient tolerated treatment well   Behavior During Therapy [facility] for tasks assessed/performed      Past Medical History:  Diagnosis Date  . Acute bronchiolitis due to other infectious organisms   . Acute bronchiolitis due to respiratory syncytial virus (RSV)   . Arthritis    OA AND PAIN RIGHT KNEE  . Degeneration of intervertebral disc, site unspecified   . Disorder of bone and cartilage, unspecified   . Disturbance of skin sensation   . Encounter for long-term (current) use of other medications   . GERD (gastroesophageal reflux disease)    PT STATES SEVERE REFLUX IF SHE DOES NOT TAKE HER OMEPRAZOLE  . Headache(784.0)    WHEN REFLUX SEVERE  . Hypothyroidism   . Long term (current) use of anticoagulants   . Osteoarthrosis, unspecified whether generalized or localized, lower leg   . Osteoporosis, unspecified   . Other and unspecified hyperlipidemia   . Other atopic dermatitis and related conditions   . Other malaise and fatigue   . Other specified pre-operative examination 11/07/2011  . Thoracic or lumbosacral neuritis or radiculitis, unspecified   . Unspecified vitamin D deficiency   . Vegetarian diet     Past Surgical History:  Procedure Laterality Date  . CERVICAL BIOPSY  W/ LOOP ELECTRODE EXCISION    . EYE SURGERY  2016   Left eye, Cataract Removal  . TEETH EXTRACTIONS--NO OTHER SURGERY    . TOTAL KNEE ARTHROPLASTY  11/28/2011   Procedure: TOTAL KNEE  ARTHROPLASTY;  Surgeon: Jacki Cones, MD;  Location: [facility];  Service: Orthopedics;  Laterality: Right;  . TUBAL LIGATION      There were no vitals filed for this visit.      Subjective Assessment - 10/16/16 1344    Subjective Pt reports no change   Pain Score 5    Pain Location Back   Pain Orientation Lower                         [facility] Adult PT Treatment/Exercise - 10/16/16 0001      Lumbar Exercises: Machines for Strengthening   Cybex Knee Flexion 20lb 2x15   Other Lumbar Machine Exercise Rows & Lats 20lb 2x10     Lumbar Exercises: Supine   Bridge 15 reps;2 seconds   Other Supine Lumbar Exercises OHP yellow ball 2x10; seated trunk rotations yellow ball 2x10   Other Supine Lumbar Exercises bridge with ball, K2C, oblq      Lumbar Exercises: Prone   Straight Leg Raise 10 reps   Other Prone Lumbar Exercises Prone press upsx10    Other Prone Lumbar Exercises back ext x10     Knee/Hip Exercises: Aerobic   Nustep level 4 x 6 minutes     Moist Heat Therapy   Number Minutes Moist Heat 15 Minutes   Moist Heat Location Lumbar Spine     Electrical Stimulation  Electrical Stimulation Location right SI area   Electrical Stimulation Action IFC   Electrical Stimulation Parameters supine   Electrical Stimulation Goals Pain                  PT Short Term Goals - 10/11/16 1427      PT SHORT TERM GOAL #1   Title independent with initial HEP   Status Achieved           PT Long Term Goals - 10/11/16 1427      PT LONG TERM GOAL #1   Title decrease pain 50%   Status On-going     PT LONG TERM GOAL #2   Title increase lumbar ROM 25%   Status On-going     PT LONG TERM GOAL #4   Title report sleep 50% better   Status On-going               Plan - 10/16/16 1435    Clinical Impression Statement Pt able to complete all of today's exercises, she does reports increase LBP with lat pull downs.  Demos some difficulty with prone  interventions   PT Frequency 2x / week   PT Duration 8 weeks   PT Treatment/Interventions ADLs/Self Care Home Management   PT Next Visit Plan postural strengthening      Patient will benefit from skilled therapeutic intervention in order to improve the following deficits and impairments:  Abnormal gait, Decreased mobility, Decreased range of motion, Decreased strength, Difficulty walking, Impaired flexibility, Pain  Visit Diagnosis: Chronic right-sided low back pain with left-sided sciatica  Muscle spasm of back     Problem List Patient Active Problem List   Diagnosis Date Noted  . Vaginal dysplasia 09/20/2016  . Muscle spasm of left lower extremity 05/11/2016  . Swelling of joint of left knee 05/11/2016  . Dysplasia of cervix, low grade (CIN 1) 05/11/2016  . High risk medication use 05/11/2016  . Thyroid activity decreased 11/19/2014  . Pain, joint, multiple sites 11/19/2014  . Numbness and tingling of left leg 04/06/2014  . Routine general medical examination at a health care facility 09/29/2013  . Osteoporosis, post-menopausal 04/21/2013  . Cough variant asthma 03/17/2013  . Psoriasis 03/17/2013  . Persistent dry cough 03/17/2013  . Osteoarthritis of left knee 03/03/2013  . Psoriasis-like skin disease 03/03/2013  . Eczema 01/07/2013  . Need for prophylactic vaccination and inoculation against influenza 01/07/2013  . Prediabetes 12/17/2012  . Cough 12/17/2012  . Reactive airway disease with wheezing 12/03/2012  . URI, acute 12/03/2012  . Hypothyroidism 10/29/2012  . Unspecified vitamin D deficiency 10/29/2012  . Hyperlipidemia LDL goal <130 10/29/2012  . GERD (gastroesophageal reflux disease) 10/29/2012  . Osteoporosis 10/29/2012  . Myalgia and myositis 10/29/2012    Grayce Sessionsonald G Pemberton, PTA 10/16/2016, 2:37 PM  [facility] [address] [place], [place], [address] Phone: [phone]   Fax:   [phone]  Name: [name] MouldSudhaben M [name] MRN: [id number] Date of Birth: 07/11/1945

## 2016-10-16 NOTE — Telephone Encounter (Signed)
Spoke with patient's son [name] [name], okay per ROI. Advised I reached out to the pharmacy to discuss his mother's rx for FU as she is almost out and has only used 2 doses. Per [name] [name] the pharmacist the patient was given 8 grams (enough for 1 month) with a syringe to use for insertion that is a 3 gram syringe, but has 2 grams marked so the patient knows where to fill the medication. Advised the pharmacist is available to assist with education on how to fill the syringe and ensure she is getting the right dose. Patient is not supposed to use the mediation this week. Per [name] Showersaj the patient has one dose left and will bring this to her appointment on 10/22/2016 with Dr.Jertson to review. [name] [name] states they will get a refill after that appointment. Already reviewed with the pharmacy and they can refill whenever needed and this will be standard cost of $25.  Routing to provider for final review.

## 2016-10-18 ENCOUNTER — Ambulatory Visit: Payer: Medicare Other | Admitting: Physical Therapy

## 2016-10-22 ENCOUNTER — Ambulatory Visit (INDEPENDENT_AMBULATORY_CARE_PROVIDER_SITE_OTHER): Payer: Medicare Other | Admitting: Obstetrics and Gynecology

## 2016-10-22 ENCOUNTER — Encounter: Payer: Self-pay | Admitting: Obstetrics and Gynecology

## 2016-10-22 VITALS — BP 112/70 | HR 84 | Resp 20 | Wt 130.0 lb

## 2016-10-22 DIAGNOSIS — N893 Dysplasia of vagina, unspecified: Secondary | ICD-10-CM | POA: Diagnosis not present

## 2016-10-22 NOTE — Progress Notes (Signed)
GYNECOLOGY  VISIT   HPI: 71 y.o.   Married  [place]  female   [phone] with No LMP recorded. Patient is postmenopausal.   here for follow up 5FU treatment and vaginal dysplasia. The patient is s/p 2 treatments with 5FU for VAIN. She didn't do her treatment last week secondary to irritation at the opening of her vagina. She is currently feeling fine.   GYNECOLOGIC HISTORY: No LMP recorded. Patient is postmenopausal. Contraception:postmenopause  Menopausal hormone therapy: none         OB History    Gravida Para Term Preterm AB Living   3 3 3     3    SAB TAB Ectopic Multiple Live Births           3         Patient Active Problem List   Diagnosis Date Noted  . Vaginal dysplasia 09/20/2016  . Muscle spasm of left lower extremity 05/11/2016  . Swelling of joint of left knee 05/11/2016  . Dysplasia of cervix, low grade (CIN 1) 05/11/2016  . High risk medication use 05/11/2016  . Thyroid activity decreased 11/19/2014  . Pain, joint, multiple sites 11/19/2014  . Numbness and tingling of left leg 04/06/2014  . Routine general medical examination at a health care facility 09/29/2013  . Osteoporosis, post-menopausal 04/21/2013  . Cough variant asthma 03/17/2013  . Psoriasis 03/17/2013  . Persistent dry cough 03/17/2013  . Osteoarthritis of left knee 03/03/2013  . Psoriasis-like skin disease 03/03/2013  . Eczema 01/07/2013  . Need for prophylactic vaccination and inoculation against influenza 01/07/2013  . Prediabetes 12/17/2012  . Cough 12/17/2012  . Reactive airway disease with wheezing 12/03/2012  . URI, acute 12/03/2012  . Hypothyroidism 10/29/2012  . Unspecified vitamin D deficiency 10/29/2012  . Hyperlipidemia LDL goal <130 10/29/2012  . GERD (gastroesophageal reflux disease) 10/29/2012  . Osteoporosis 10/29/2012  . Myalgia and myositis 10/29/2012    Past Medical History:  Diagnosis Date  . Acute bronchiolitis due to other infectious organisms   . Acute bronchiolitis due  to respiratory syncytial virus (RSV)   . Arthritis    OA AND PAIN RIGHT KNEE  . Degeneration of intervertebral disc, site unspecified   . Disorder of bone and cartilage, unspecified   . Disturbance of skin sensation   . Encounter for long-term (current) use of other medications   . GERD (gastroesophageal reflux disease)    PT STATES SEVERE REFLUX IF SHE DOES NOT TAKE HER OMEPRAZOLE  . Headache(784.0)    WHEN REFLUX SEVERE  . Hypothyroidism   . Long term (current) use of anticoagulants   . Osteoarthrosis, unspecified whether generalized or localized, lower leg   . Osteoporosis, unspecified   . Other and unspecified hyperlipidemia   . Other atopic dermatitis and related conditions   . Other malaise and fatigue   . Other specified pre-operative examination 11/07/2011  . Thoracic or lumbosacral neuritis or radiculitis, unspecified   . Unspecified vitamin D deficiency   . Vegetarian diet     Past Surgical History:  Procedure Laterality Date  . CERVICAL BIOPSY  W/ LOOP ELECTRODE EXCISION    . EYE SURGERY  2016   Left eye, Cataract Removal  . TEETH EXTRACTIONS--NO OTHER SURGERY    . TOTAL KNEE ARTHROPLASTY  11/28/2011   Procedure: TOTAL KNEE ARTHROPLASTY;  Surgeon: Jacki Cones, MD;  Location: [facility];  Service: Orthopedics;  Laterality: Right;  . TUBAL LIGATION      Current Outpatient Prescriptions  Medication Sig  Dispense Refill  . aspirin 81 MG tablet Take 81 mg by mouth daily.    . calcium carbonate (OS-CAL) 600 MG TABS Take 600 mg by mouth 2 (two) times daily with a meal. Take one tablet twice a day for calcium supplement    . ferrous sulfate 325 (65 FE) MG tablet Take 1 tablet (325 mg total) by mouth daily with breakfast. 30 tablet 3  . levothyroxine (SYNTHROID, LEVOTHROID) 25 MCG tablet Take 1.5 tablets (37.5 mcg total) by mouth daily before breakfast. 135 tablet 4  . NONFORMULARY OR COMPOUNDED ITEM Fluorouracil 5 % cream (5 FU) place 2 grams vaginally at bedtime once per  week for 10 weeks. Patient will need vaginal applicators. Dispense 20 grams 0RF. 1 each 0  . omeprazole (PRILOSEC) 20 MG capsule Take one capsule by mouth once daily to reduce stomach acid 90 capsule 3  . simvastatin (ZOCOR) 20 MG tablet Take one tablet by mouth once daily for cholesterol 90 tablet 3   No current facility-administered medications for this visit.      ALLERGIES: Prednisolone  Family History  Problem Relation Age of Onset  . Alport syndrome Brother     Social History   Social History  . Marital status: Married    Spouse name: N/A  . Number of children: N/A  . Years of education: N/A   Occupational History  . Not on file.   Social History Main Topics  . Smoking status: Never Smoker  . Smokeless tobacco: Never Used  . Alcohol use No  . Drug use: No  . Sexual activity: Yes    Birth control/ protection: Post-menopausal   Other Topics Concern  . Not on file   Social History Narrative  . No narrative on file    Review of Systems  Constitutional: Negative.   HENT: Negative.   Eyes: Negative.   Respiratory: Negative.   Cardiovascular: Negative.   Gastrointestinal: Negative.   Genitourinary: Negative.   Musculoskeletal: Negative.   Skin: Negative.   Neurological: Negative.   Endo/Heme/Allergies: Negative.   Psychiatric/Behavioral: Negative.     PHYSICAL EXAMINATION:    BP 112/70 (BP Location: Right Arm, Patient Position: Sitting, Cuff Size: Normal)   Pulse 84   Resp 20   Wt 130 lb (59 kg)   BMI 28.13 kg/m     General appearance: alert, cooperative and appears stated age  Pelvic: External genitalia:  no lesions              Urethra:  normal appearing urethra with no masses, tenderness or lesions              Bartholins and Skenes: normal                 Vagina: normal appearing atrophic vagina with normal color and discharge, no lesions              Cervix: flush with the vagina, no lesions               Chaperone was present for  exam.  ASSESSMENT H/O vaginal dysplasia S/P 2 treatments of 5FU, irritation at the opening of the vagina with her last dose. Now healed    PLAN Reviewed how to use the 5FU, her daughter is going to help with insertion of the 5FU, showed her how deep to place the syringe. Discussed using Vaseline externally to prevent vulvar irritation F/U in 2 weeks, call with any concerns   An After Visit Summary was printed  and given to the patient.  10-15 minutes face to face time of which over 50% was spent in counseling.   The patient was present with her daughter and interpreter.

## 2016-10-23 ENCOUNTER — Ambulatory Visit: Payer: Medicare Other | Admitting: Physical Therapy

## 2016-10-23 ENCOUNTER — Encounter: Payer: Self-pay | Admitting: Physical Therapy

## 2016-10-23 DIAGNOSIS — M25552 Pain in left hip: Secondary | ICD-10-CM | POA: Diagnosis not present

## 2016-10-23 DIAGNOSIS — M5442 Lumbago with sciatica, left side: Secondary | ICD-10-CM | POA: Diagnosis not present

## 2016-10-23 DIAGNOSIS — G8929 Other chronic pain: Secondary | ICD-10-CM

## 2016-10-23 DIAGNOSIS — M7632 Iliotibial band syndrome, left leg: Secondary | ICD-10-CM | POA: Diagnosis not present

## 2016-10-23 DIAGNOSIS — M62830 Muscle spasm of back: Secondary | ICD-10-CM

## 2016-10-23 NOTE — Therapy (Signed)
[facility] [address] [place], [place], [address] Phone: [phone]   Fax:  [phone]  Physical Therapy Treatment  Patient Details  Name: [name] [name] MRN: [id number] Date of Birth: 07/04/1945 Referring Provider: Mckinley Jewelaniel Murphy  Encounter Date: 10/23/2016      PT End of Session - 10/23/16 1431    Visit Number 5   Date for PT Re-Evaluation 12/04/16   PT Start Time 1345   PT Stop Time 1445   PT Time Calculation (min) 60 min   Activity Tolerance Patient tolerated treatment well   Behavior During Therapy [facility] for tasks assessed/performed      Past Medical History:  Diagnosis Date  . Acute bronchiolitis due to other infectious organisms   . Acute bronchiolitis due to respiratory syncytial virus (RSV)   . Arthritis    OA AND PAIN RIGHT KNEE  . Degeneration of intervertebral disc, site unspecified   . Disorder of bone and cartilage, unspecified   . Disturbance of skin sensation   . Encounter for long-term (current) use of other medications   . GERD (gastroesophageal reflux disease)    PT STATES SEVERE REFLUX IF SHE DOES NOT TAKE HER OMEPRAZOLE  . Headache(784.0)    WHEN REFLUX SEVERE  . Hypothyroidism   . Long term (current) use of anticoagulants   . Osteoarthrosis, unspecified whether generalized or localized, lower leg   . Osteoporosis, unspecified   . Other and unspecified hyperlipidemia   . Other atopic dermatitis and related conditions   . Other malaise and fatigue   . Other specified pre-operative examination 11/07/2011  . Thoracic or lumbosacral neuritis or radiculitis, unspecified   . Unspecified vitamin D deficiency   . Vegetarian diet     Past Surgical History:  Procedure Laterality Date  . CERVICAL BIOPSY  W/ LOOP ELECTRODE EXCISION    . EYE SURGERY  2016   Left eye, Cataract Removal  . TEETH EXTRACTIONS--NO OTHER SURGERY    . TOTAL KNEE ARTHROPLASTY  11/28/2011   Procedure: TOTAL KNEE  ARTHROPLASTY;  Surgeon: Jacki Conesonald A Gioffre, MD;  Location: [facility];  Service: Orthopedics;  Laterality: Right;  . TUBAL LIGATION      There were no vitals filed for this visit.      Subjective Assessment - 10/23/16 1349    Subjective No changes in the running sensation in her L thigh, but LBP has improved   Currently in Pain? No/denies   Pain Score 0-No pain                         [facility] Adult PT Treatment/Exercise - 10/23/16 0001      Lumbar Exercises: Machines for Strengthening   Cybex Knee Flexion 20lb 2x15   Other Lumbar Machine Exercise Rows & Lats 20lb 2x10     Lumbar Exercises: Supine   Other Supine Lumbar Exercises OHP yellow ball 2x10; seated trunk rotations yellow ball 2x10   Other Supine Lumbar Exercises bridge with ball, K2C, oblq; Bilat SLR x10      Knee/Hip Exercises: Aerobic   Nustep level 4 x 7 minutes     Moist Heat Therapy   Number Minutes Moist Heat 15 Minutes   Moist Heat Location Lumbar Spine     Electrical Stimulation   Electrical Stimulation Location right SI area   Electrical Stimulation Action IFC   Electrical Stimulation Parameters supine  PT Short Term Goals - 10/11/16 1427      PT SHORT TERM GOAL #1   Title independent with initial HEP   Status Achieved           PT Long Term Goals - 10/11/16 1427      PT LONG TERM GOAL #1   Title decrease pain 50%   Status On-going     PT LONG TERM GOAL #2   Title increase lumbar ROM 25%   Status On-going     PT LONG TERM GOAL #4   Title report sleep 50% better   Status On-going               Plan - 10/23/16 1436    Clinical Impression Statement Good strenght and ROm with al exercises. Reports imporvement with her LBP. Continues to reports a runninf sensation down lateral L thigh.   Rehab Potential Good   PT Frequency 2x / week   PT Duration 8 weeks   PT Next Visit Plan postural strengthening      Patient will benefit from skilled  therapeutic intervention in order to improve the following deficits and impairments:  Abnormal gait, Decreased mobility, Decreased range of motion, Decreased strength, Difficulty walking, Impaired flexibility, Pain  Visit Diagnosis: Chronic right-sided low back pain with left-sided sciatica  Muscle spasm of back  Pain in left hip     Problem List Patient Active Problem List   Diagnosis Date Noted  . Vaginal dysplasia 09/20/2016  . Muscle spasm of left lower extremity 05/11/2016  . Swelling of joint of left knee 05/11/2016  . Dysplasia of cervix, low grade (CIN 1) 05/11/2016  . High risk medication use 05/11/2016  . Thyroid activity decreased 11/19/2014  . Pain, joint, multiple sites 11/19/2014  . Numbness and tingling of left leg 04/06/2014  . Routine general medical examination at a health care facility 09/29/2013  . Osteoporosis, post-menopausal 04/21/2013  . Cough variant asthma 03/17/2013  . Psoriasis 03/17/2013  . Persistent dry cough 03/17/2013  . Osteoarthritis of left knee 03/03/2013  . Psoriasis-like skin disease 03/03/2013  . Eczema 01/07/2013  . Need for prophylactic vaccination and inoculation against influenza 01/07/2013  . Prediabetes 12/17/2012  . Cough 12/17/2012  . Reactive airway disease with wheezing 12/03/2012  . URI, acute 12/03/2012  . Hypothyroidism 10/29/2012  . Unspecified vitamin D deficiency 10/29/2012  . Hyperlipidemia LDL goal <130 10/29/2012  . GERD (gastroesophageal reflux disease) 10/29/2012  . Osteoporosis 10/29/2012  . Myalgia and myositis 10/29/2012    Grayce Sessions, PTA 10/23/2016, 2:38 PM  [facility] [address] [place], [place], [address] Phone: [phone]   Fax:  [phone]  Name: [name] [name] MRN: [id number] Date of Birth: 1946/01/31

## 2016-10-29 ENCOUNTER — Encounter: Payer: Self-pay | Admitting: Rehabilitation

## 2016-10-29 ENCOUNTER — Ambulatory Visit: Payer: Medicare Other | Admitting: Rehabilitation

## 2016-10-29 DIAGNOSIS — M62830 Muscle spasm of back: Secondary | ICD-10-CM | POA: Diagnosis not present

## 2016-10-29 DIAGNOSIS — M7632 Iliotibial band syndrome, left leg: Secondary | ICD-10-CM | POA: Diagnosis not present

## 2016-10-29 DIAGNOSIS — M25552 Pain in left hip: Secondary | ICD-10-CM | POA: Diagnosis not present

## 2016-10-29 DIAGNOSIS — G8929 Other chronic pain: Secondary | ICD-10-CM

## 2016-10-29 DIAGNOSIS — M5442 Lumbago with sciatica, left side: Principal | ICD-10-CM

## 2016-10-29 NOTE — Therapy (Signed)
[facility] [address] [place], [place], [address] Phone: [phone]   Fax:  [phone]  Physical Therapy Treatment  Patient Details  Name: [name] [name] MRN: [id number] Date of Birth: 27-Nov-1945 Referring Provider: Mckinley Jewel  Encounter Date: 10/29/2016      PT End of Session - 10/29/16 1509    Visit Number 6   Date for PT Re-Evaluation 12/04/16   PT Start Time 1445   PT Stop Time 1542   PT Time Calculation (min) 57 min   Activity Tolerance Patient tolerated treatment well      Past Medical History:  Diagnosis Date  . Acute bronchiolitis due to other infectious organisms   . Acute bronchiolitis due to respiratory syncytial virus (RSV)   . Arthritis    OA AND PAIN RIGHT KNEE  . Degeneration of intervertebral disc, site unspecified   . Disorder of bone and cartilage, unspecified   . Disturbance of skin sensation   . Encounter for long-term (current) use of other medications   . GERD (gastroesophageal reflux disease)    PT STATES SEVERE REFLUX IF SHE DOES NOT TAKE HER OMEPRAZOLE  . Headache(784.0)    WHEN REFLUX SEVERE  . Hypothyroidism   . Long term (current) use of anticoagulants   . Osteoarthrosis, unspecified whether generalized or localized, lower leg   . Osteoporosis, unspecified   . Other and unspecified hyperlipidemia   . Other atopic dermatitis and related conditions   . Other malaise and fatigue   . Other specified pre-operative examination 11/07/2011  . Thoracic or lumbosacral neuritis or radiculitis, unspecified   . Unspecified vitamin D deficiency   . Vegetarian diet     Past Surgical History:  Procedure Laterality Date  . CERVICAL BIOPSY  W/ LOOP ELECTRODE EXCISION    . EYE SURGERY  2016   Left eye, Cataract Removal  . TEETH EXTRACTIONS--NO OTHER SURGERY    . TOTAL KNEE ARTHROPLASTY  11/28/2011   Procedure: TOTAL KNEE ARTHROPLASTY;  Surgeon: Jacki Cones, MD;  Location: [facility];  Service: Orthopedics;  Laterality: Right;  . TUBAL LIGATION      There were no vitals filed for this visit.      Subjective Assessment - 10/29/16 1445    Subjective Feeling the same   Pain Score 6    Pain Location Back   Pain Orientation Lower   Pain Descriptors / Indicators Aching;Sore   Pain Type Chronic pain            [facility] PT Assessment - 10/29/16 0001      AROM   Overall AROM Comments Pain with extension and decreased 25% also pain with side bend R                     [facility] Adult PT Treatment/Exercise - 10/29/16 0001      Lumbar Exercises: Stretches   Passive Hamstring Stretch 3 reps;20 seconds   Passive Hamstring Stretch Limitations and lateral hamstrings manually for both   Single Knee to Chest Stretch 5 reps  3" hold bil   Single Knee to Chest Stretch Limitations increased L tingling   Double Knee to Chest Stretch 3 reps;10 seconds     Lumbar Exercises: Machines for Strengthening   Other Lumbar Machine Exercise Rows & Lats 20lb 2x10     Lumbar Exercises: Standing   Wall Slides Limitations x8 on door   Other Standing Lumbar Exercises trA + straight arm  pull down x 10 at machine     Lumbar Exercises: Supine   Bridge 15 reps   Bridge Limitations with ball   Straight Leg Raise 10 reps   Straight Leg Raises Limitations bil   Other Supine Lumbar Exercises OHP yellow ball seated     Knee/Hip Exercises: Aerobic   Nustep level 5x688min     Moist Heat Therapy   Number Minutes Moist Heat 15 Minutes   Moist Heat Location Lumbar Spine     Electrical Stimulation   Electrical Stimulation Location right SI area   Electrical Stimulation Action IFC   Electrical Stimulation Parameters supine   Electrical Stimulation Goals Pain                  PT Short Term Goals - 10/11/16 1427      PT SHORT TERM GOAL #1   Title independent with initial HEP   Status Achieved           PT Long Term Goals - 10/29/16 1515      PT LONG TERM  GOAL #1   Title decrease pain 50%   Status On-going     PT LONG TERM GOAL #2   Title increase lumbar ROM 25%   Status On-going     PT LONG TERM GOAL #3   Title go up and down stairs step over step   Status On-going     PT LONG TERM GOAL #4   Title report sleep 50% better   Status On-going               Plan - 10/29/16 1514    Clinical Impression Statement Pt continues to report the same tingling in the L LE which worsens with supine hip flexion/STKC.     PT Next Visit Plan continue core strengthening      Patient will benefit from skilled therapeutic intervention in order to improve the following deficits and impairments:     Visit Diagnosis: Chronic right-sided low back pain with left-sided sciatica  Muscle spasm of back     Problem List Patient Active Problem List   Diagnosis Date Noted  . Vaginal dysplasia 09/20/2016  . Muscle spasm of left lower extremity 05/11/2016  . Swelling of joint of left knee 05/11/2016  . Dysplasia of cervix, low grade (CIN 1) 05/11/2016  . High risk medication use 05/11/2016  . Thyroid activity decreased 11/19/2014  . Pain, joint, multiple sites 11/19/2014  . Numbness and tingling of left leg 04/06/2014  . Routine general medical examination at a health care facility 09/29/2013  . Osteoporosis, post-menopausal 04/21/2013  . Cough variant asthma 03/17/2013  . Psoriasis 03/17/2013  . Persistent dry cough 03/17/2013  . Osteoarthritis of left knee 03/03/2013  . Psoriasis-like skin disease 03/03/2013  . Eczema 01/07/2013  . Need for prophylactic vaccination and inoculation against influenza 01/07/2013  . Prediabetes 12/17/2012  . Cough 12/17/2012  . Reactive airway disease with wheezing 12/03/2012  . URI, acute 12/03/2012  . Hypothyroidism 10/29/2012  . Unspecified vitamin D deficiency 10/29/2012  . Hyperlipidemia LDL goal <130 10/29/2012  . GERD (gastroesophageal reflux disease) 10/29/2012  . Osteoporosis 10/29/2012  .  Myalgia and myositis 10/29/2012    Idamae Lusherevis, Maanasa Aderhold R, DPT, CMP 10/29/2016, 3:26 PM  [facility] [address] [place], [place], [address] Phone: [phone]   Fax:  [phone]  Name: [name] [name] MRN: [id number] Date of Birth: 02/21/1946

## 2016-11-01 ENCOUNTER — Encounter: Payer: Self-pay | Admitting: Physical Therapy

## 2016-11-01 ENCOUNTER — Ambulatory Visit: Payer: Medicare Other | Admitting: Physical Therapy

## 2016-11-01 DIAGNOSIS — M5442 Lumbago with sciatica, left side: Principal | ICD-10-CM

## 2016-11-01 DIAGNOSIS — M62830 Muscle spasm of back: Secondary | ICD-10-CM | POA: Diagnosis not present

## 2016-11-01 DIAGNOSIS — M25552 Pain in left hip: Secondary | ICD-10-CM | POA: Diagnosis not present

## 2016-11-01 DIAGNOSIS — G8929 Other chronic pain: Secondary | ICD-10-CM | POA: Diagnosis not present

## 2016-11-01 DIAGNOSIS — M7632 Iliotibial band syndrome, left leg: Secondary | ICD-10-CM

## 2016-11-01 NOTE — Therapy (Signed)
[place], [place], [address] Phone: [phone]   Fax:  [phone]  Physical Therapy Treatment  Patient Details  Name: [name] [name] MRN: [id number] Date of Birth: 01/03/1946 Referring Provider: Kathryne Hitch  Encounter Date: 11/01/2016      PT End of Session - 11/01/16 0925    Visit Number 7   Date for PT Re-Evaluation 12/04/16   PT Start Time 0840   PT Stop Time 0925   PT Time Calculation (min) 45 min   Activity Tolerance Patient tolerated treatment well   Behavior During Therapy [facility] for tasks assessed/performed      Past Medical History:  Diagnosis Date  . Acute bronchiolitis due to other infectious organisms   . Acute bronchiolitis due to respiratory syncytial virus (RSV)   . Arthritis    OA AND PAIN RIGHT KNEE  . Degeneration of intervertebral disc, site unspecified   . Disorder of bone and cartilage, unspecified   . Disturbance of skin sensation   . Encounter for long-term (current) use of other medications   . GERD (gastroesophageal reflux disease)    PT STATES SEVERE REFLUX IF SHE DOES NOT TAKE HER OMEPRAZOLE  . Headache(784.0)    WHEN REFLUX SEVERE  . Hypothyroidism   . Long term (current) use of anticoagulants   . Osteoarthrosis, unspecified whether generalized or localized, lower leg   . Osteoporosis, unspecified   . Other and unspecified hyperlipidemia   . Other atopic dermatitis and related conditions   . Other malaise and fatigue   . Other specified pre-operative examination 11/07/2011  . Thoracic or lumbosacral neuritis or radiculitis, unspecified   . Unspecified vitamin D deficiency   . Vegetarian diet     Past Surgical History:  Procedure Laterality Date  . CERVICAL BIOPSY  W/ LOOP ELECTRODE EXCISION    . EYE SURGERY  2016   Left eye, Cataract Removal  . TEETH EXTRACTIONS--NO OTHER SURGERY    . TOTAL KNEE ARTHROPLASTY  11/28/2011   Procedure: TOTAL KNEE  ARTHROPLASTY;  Surgeon: Tobi Bastos, MD;  Location: [facility];  Service: Orthopedics;  Laterality: Right;  . TUBAL LIGATION      There were no vitals filed for this visit.      Subjective Assessment - 11/01/16 0922    Subjective Patient reports "same", only has pain with lying down flat (supine or prone).     Currently in Pain? No/denies   Aggravating Factors  pain down the left lateral leg only with lying down flat                         [facility] Adult PT Treatment/Exercise - 11/01/16 0001      Electrical Stimulation   Electrical Stimulation Location left SI and iliac creast area   Electrical Stimulation Action US/estim combo   Electrical Stimulation Parameters prone   Electrical Stimulation Goals Pain     Manual Therapy   Manual Therapy Soft tissue mobilization;Passive ROM   Soft tissue mobilization to the left SI, lumbar area and into the buttock   Passive ROM left hip   Kinesiotex Create Space     Kinesiotix   Create Space left SI area                  PT Short Term Goals - 10/11/16 1427      PT SHORT TERM GOAL #1   Title independent with initial  HEP   Status Achieved           PT Long Term Goals - 11/01/16 0814      PT LONG TERM GOAL #1   Title decrease pain 50%   Status On-going     PT LONG TERM GOAL #2   Title increase lumbar ROM 25%   Status Achieved     PT LONG TERM GOAL #3   Title go up and down stairs step over step   Status Partially Met               Plan - 11/01/16 0925    Clinical Impression Statement PT has tried traction, core strength, stretches, taping, modalities for pain without much benefit.  I tried some different modalities today as well as the taping, I cancelled the appointment that she has with [place] n Monday and asked her to see her MD, she has an appointment with him on Tuesday   PT Next Visit Plan Hold until she sees the MD as nothing we have done so far has changed the pain that she has with  lying flat   Consulted and Agree with Plan of Care Patient      Patient will benefit from skilled therapeutic intervention in order to improve the following deficits and impairments:  Abnormal gait, Decreased mobility, Decreased range of motion, Decreased strength, Difficulty walking, Impaired flexibility, Pain  Visit Diagnosis: Chronic right-sided low back pain with left-sided sciatica  Muscle spasm of back  Pain in left hip  Iliotibial band syndrome of left side     Problem List Patient Active Problem List   Diagnosis Date Noted  . Vaginal dysplasia 09/20/2016  . Muscle spasm of left lower extremity 05/11/2016  . Swelling of joint of left knee 05/11/2016  . Dysplasia of cervix, low grade (CIN 1) 05/11/2016  . High risk medication use 05/11/2016  . Thyroid activity decreased 11/19/2014  . Pain, joint, multiple sites 11/19/2014  . Numbness and tingling of left leg 04/06/2014  . Routine general medical examination at a health care facility 09/29/2013  . Osteoporosis, post-menopausal 04/21/2013  . Cough variant asthma 03/17/2013  . Psoriasis 03/17/2013  . Persistent dry cough 03/17/2013  . Osteoarthritis of left knee 03/03/2013  . Psoriasis-like skin disease 03/03/2013  . Eczema 01/07/2013  . Need for prophylactic vaccination and inoculation against influenza 01/07/2013  . Prediabetes 12/17/2012  . Cough 12/17/2012  . Reactive airway disease with wheezing 12/03/2012  . URI, acute 12/03/2012  . Hypothyroidism 10/29/2012  . Unspecified vitamin D deficiency 10/29/2012  . Hyperlipidemia LDL goal <130 10/29/2012  . GERD (gastroesophageal reflux disease) 10/29/2012  . Osteoporosis 10/29/2012  . Myalgia and myositis 10/29/2012    Sumner Boast., PT 11/01/2016, 9:28 AM  [facility] [address] [place], [place], [address] Phone: [phone]   Fax:  [phone]  Name: [name] [name] MRN:  [id number] Date of Birth: 05/05/1946

## 2016-11-02 ENCOUNTER — Other Ambulatory Visit: Payer: Self-pay | Admitting: Internal Medicine

## 2016-11-05 ENCOUNTER — Encounter: Payer: Medicare Other | Admitting: Physical Therapy

## 2016-11-05 ENCOUNTER — Ambulatory Visit: Payer: Medicare Other | Admitting: Physical Therapy

## 2016-11-05 ENCOUNTER — Ambulatory Visit (INDEPENDENT_AMBULATORY_CARE_PROVIDER_SITE_OTHER): Payer: Medicare Other | Admitting: Obstetrics and Gynecology

## 2016-11-05 ENCOUNTER — Encounter: Payer: Self-pay | Admitting: Obstetrics and Gynecology

## 2016-11-05 VITALS — BP 130/70 | HR 88 | Resp 16 | Wt 130.0 lb

## 2016-11-05 DIAGNOSIS — N893 Dysplasia of vagina, unspecified: Secondary | ICD-10-CM | POA: Diagnosis not present

## 2016-11-05 NOTE — Progress Notes (Signed)
GYNECOLOGY  VISIT   HPI: 71 y.o.   Married  Asian  female   [phone] with No LMP recorded. Patient is postmenopausal.   here for follow up vaginal dysplasia. She has used the 5FU x 4. Do to use it again tonight. She got irritated externally after the second dose. We discussed how to apply vaseline externally and to get the syringe up inside the vagina. Now she feels fine. Due for her 5th dose tonight (daughter is placing it for her).   GYNECOLOGIC HISTORY: No LMP recorded. Patient is postmenopausal. Contraception:postmenopause  Menopausal hormone therapy: none         OB History    Gravida Para Term Preterm AB Living   3 3 3     3    SAB TAB Ectopic Multiple Live Births           3         Patient Active Problem List   Diagnosis Date Noted  . Vaginal dysplasia 09/20/2016  . Muscle spasm of left lower extremity 05/11/2016  . Swelling of joint of left knee 05/11/2016  . Dysplasia of cervix, low grade (CIN 1) 05/11/2016  . High risk medication use 05/11/2016  . Thyroid activity decreased 11/19/2014  . Pain, joint, multiple sites 11/19/2014  . Numbness and tingling of left leg 04/06/2014  . Routine general medical examination at a health care facility 09/29/2013  . Osteoporosis, post-menopausal 04/21/2013  . Cough variant asthma 03/17/2013  . Psoriasis 03/17/2013  . Persistent dry cough 03/17/2013  . Osteoarthritis of left knee 03/03/2013  . Psoriasis-like skin disease 03/03/2013  . Eczema 01/07/2013  . Need for prophylactic vaccination and inoculation against influenza 01/07/2013  . Prediabetes 12/17/2012  . Cough 12/17/2012  . Reactive airway disease with wheezing 12/03/2012  . URI, acute 12/03/2012  . Hypothyroidism 10/29/2012  . Unspecified vitamin D deficiency 10/29/2012  . Hyperlipidemia LDL goal <130 10/29/2012  . GERD (gastroesophageal reflux disease) 10/29/2012  . Osteoporosis 10/29/2012  . Myalgia and myositis 10/29/2012    Past Medical History:  Diagnosis Date   . Acute bronchiolitis due to other infectious organisms   . Acute bronchiolitis due to respiratory syncytial virus (RSV)   . Arthritis    OA AND PAIN RIGHT KNEE  . Degeneration of intervertebral disc, site unspecified   . Disorder of bone and cartilage, unspecified   . Disturbance of skin sensation   . Encounter for long-term (current) use of other medications   . GERD (gastroesophageal reflux disease)    PT STATES SEVERE REFLUX IF SHE DOES NOT TAKE HER OMEPRAZOLE  . Headache(784.0)    WHEN REFLUX SEVERE  . Hypothyroidism   . Long term (current) use of anticoagulants   . Osteoarthrosis, unspecified whether generalized or localized, lower leg   . Osteoporosis, unspecified   . Other and unspecified hyperlipidemia   . Other atopic dermatitis and related conditions   . Other malaise and fatigue   . Other specified pre-operative examination 11/07/2011  . Thoracic or lumbosacral neuritis or radiculitis, unspecified   . Unspecified vitamin D deficiency   . Vegetarian diet     Past Surgical History:  Procedure Laterality Date  . CERVICAL BIOPSY  W/ LOOP ELECTRODE EXCISION    . EYE SURGERY  2016   Left eye, Cataract Removal  . TEETH EXTRACTIONS--NO OTHER SURGERY    . TOTAL KNEE ARTHROPLASTY  11/28/2011   Procedure: TOTAL KNEE ARTHROPLASTY;  Surgeon: Jacki Cones, MD;  Location: [facility];  Service:  Orthopedics;  Laterality: Right;  . TUBAL LIGATION      Current Outpatient Prescriptions  Medication Sig Dispense Refill  . aspirin 81 MG tablet Take 81 mg by mouth daily.    . calcium carbonate (OS-CAL) 600 MG TABS Take 600 mg by mouth 2 (two) times daily with a meal. Take one tablet twice a day for calcium supplement    . ferrous sulfate 325 (65 FE) MG tablet Take 1 tablet (325 mg total) by mouth daily with breakfast. 30 tablet 3  . levothyroxine (SYNTHROID, LEVOTHROID) 25 MCG tablet Take 1.5 tablets (37.5 mcg total) by mouth daily before breakfast. 135 tablet 4  . NONFORMULARY OR  COMPOUNDED ITEM Fluorouracil 5 % cream (5 FU) place 2 grams vaginally at bedtime once per week for 10 weeks. Patient will need vaginal applicators. Dispense 20 grams 0RF. 1 each 0  . omeprazole (PRILOSEC) 20 MG capsule TAKE ONE CAPSULE BY MOUTH ONCE DAILY TO REDUCE  STOMACH ACID 90 capsule 3  . simvastatin (ZOCOR) 20 MG tablet TAKE ONE TABLET BY MOUTH ONCE DAILY FOR CHOLESTEROL 90 tablet 3   No current facility-administered medications for this visit.      ALLERGIES: Prednisolone  Family History  Problem Relation Age of Onset  . Alport syndrome Brother     Social History   Social History  . Marital status: Married    Spouse name: N/A  . Number of children: N/A  . Years of education: N/A   Occupational History  . Not on file.   Social History Main Topics  . Smoking status: Never Smoker  . Smokeless tobacco: Never Used  . Alcohol use No  . Drug use: No  . Sexual activity: Yes    Birth control/ protection: Post-menopausal   Other Topics Concern  . Not on file   Social History Narrative  . No narrative on file    Review of Systems  Constitutional: Negative.   HENT: Negative.   Eyes: Negative.   Respiratory: Negative.   Cardiovascular: Negative.   Gastrointestinal: Negative.   Genitourinary: Negative.   Musculoskeletal: Negative.   Skin: Negative.   Neurological: Negative.   Endo/Heme/Allergies: Negative.   Psychiatric/Behavioral: Negative.     PHYSICAL EXAMINATION:    BP 130/70 (BP Location: Right Arm, Patient Position: Sitting, Cuff Size: Normal)   Pulse 88   Resp 16   Wt 130 lb (59 kg)   BMI 28.13 kg/m     General appearance: alert, cooperative and appears stated age  Pelvic: External genitalia:  no lesions, +atrophy              Urethra:  normal appearing urethra with no masses, tenderness or lesions              Bartholins and Skenes: normal                 Vagina: normal appearing atrophic vagina with normal color and discharge, no lesions               Cervix: flush with vagina, no lesions Chaperone was present for exam.  ASSESSMENT Vaginal dysplasia, doing well with 5FU, s/p 4 doses    PLAN Continue q week, f/u in 4 weeks prior to her 8th dose Plan a total of 10 doses   An After Visit Summary was printed and given to the patient.

## 2016-11-06 ENCOUNTER — Other Ambulatory Visit: Payer: Medicare Other

## 2016-11-06 DIAGNOSIS — M5416 Radiculopathy, lumbar region: Secondary | ICD-10-CM | POA: Diagnosis not present

## 2016-11-06 DIAGNOSIS — M48061 Spinal stenosis, lumbar region without neurogenic claudication: Secondary | ICD-10-CM | POA: Diagnosis not present

## 2016-11-08 ENCOUNTER — Other Ambulatory Visit: Payer: Self-pay | Admitting: Internal Medicine

## 2016-11-08 DIAGNOSIS — Z1231 Encounter for screening mammogram for malignant neoplasm of breast: Secondary | ICD-10-CM

## 2016-11-09 ENCOUNTER — Ambulatory Visit: Payer: Medicare Other | Admitting: Internal Medicine

## 2016-11-20 DIAGNOSIS — H35342 Macular cyst, hole, or pseudohole, left eye: Secondary | ICD-10-CM | POA: Diagnosis not present

## 2016-11-20 DIAGNOSIS — H43811 Vitreous degeneration, right eye: Secondary | ICD-10-CM | POA: Insufficient documentation

## 2016-11-20 DIAGNOSIS — H524 Presbyopia: Secondary | ICD-10-CM | POA: Insufficient documentation

## 2016-11-20 DIAGNOSIS — H2511 Age-related nuclear cataract, right eye: Secondary | ICD-10-CM | POA: Insufficient documentation

## 2016-11-20 DIAGNOSIS — H25011 Cortical age-related cataract, right eye: Secondary | ICD-10-CM | POA: Diagnosis not present

## 2016-11-28 ENCOUNTER — Telehealth: Payer: Self-pay | Admitting: Obstetrics and Gynecology

## 2016-11-28 NOTE — Telephone Encounter (Signed)
Spoke with patient and Dawson BillsSon, Raj. Patient has completed 5 FU and has f/u scheduled for 12/05/16 at 4pm with Dr. Oscar LaJertson. Patient states she is not having any problems, was not sure if f/u was needed prior to 8/1 for more medication. Advised patient and Elby ShowersRaj, will review with Dr. Oscar LaJertson and return call. Patient is agreeable.    Dr. Oscar LaJertson -5 FU completed, scheduled for f/u 8/1, keep as scheduled? Any additional medication needed?

## 2016-11-28 NOTE — Telephone Encounter (Signed)
Spoke with Elby Showersaj. Patient scheduled for OV with Dr. Oscar LaJertson on 11/29/16 at 8am. Murriel HopperAdvised Raj, Dr. Oscar LaJertson will advise on f/u at OV tomorrow, 8/1 OV has not been cancelled. Elby ShowersRaj verbalizes understanding and is agreeable.  Routing to provider for final review. Patient is agreeable to disposition. Will close encounter.

## 2016-11-28 NOTE — Telephone Encounter (Signed)
Patient's son, Robb MatarRaj Lucks (DPR on file to share PHI), returned call on behalf of the patient.

## 2016-11-28 NOTE — Telephone Encounter (Signed)
Reviewed with Dr. Oscar LaJertson. OV needed for evaluation.   Call to patient, spoke with Elby Showersaj, advised as seen above per Dr. Oscar LaJertson. Offered OV for 11/29/16 at 8am. No appointment scheduled, Elby ShowersRaj will need to return call.

## 2016-11-28 NOTE — Telephone Encounter (Signed)
Patient's spouse calling because she is done with the medication and still having problems.

## 2016-11-29 ENCOUNTER — Encounter: Payer: Self-pay | Admitting: Obstetrics and Gynecology

## 2016-11-29 ENCOUNTER — Ambulatory Visit (INDEPENDENT_AMBULATORY_CARE_PROVIDER_SITE_OTHER): Payer: Medicare Other | Admitting: Obstetrics and Gynecology

## 2016-11-29 ENCOUNTER — Other Ambulatory Visit: Payer: Self-pay

## 2016-11-29 VITALS — BP 124/76 | HR 88 | Resp 16 | Ht <= 58 in | Wt 130.0 lb

## 2016-11-29 DIAGNOSIS — N893 Dysplasia of vagina, unspecified: Secondary | ICD-10-CM | POA: Diagnosis not present

## 2016-11-29 MED ORDER — NONFORMULARY OR COMPOUNDED ITEM: 0 refills | Status: DC

## 2016-11-29 NOTE — Progress Notes (Signed)
GYNECOLOGY  VISIT   HPI: 71 y.o.   Married  [place]  female   [phone] with No LMP recorded. Patient is postmenopausal.   here for follow up vaginal dysplasia. The patient is s/p 8 weekly doses of 5FU for vaginal dysplasia. She is without c/o. No vaginal/vulvar irritation.   GYNECOLOGIC HISTORY: No LMP recorded. Patient is postmenopausal. Contraception:post menopausal  Menopausal hormone therapy: none        OB History    Gravida Para Term Preterm AB Living   3 3 3     3    SAB TAB Ectopic Multiple Live Births           3         Patient Active Problem List   Diagnosis Date Noted  . Vaginal dysplasia 09/20/2016  . Muscle spasm of left lower extremity 05/11/2016  . Swelling of joint of left knee 05/11/2016  . Dysplasia of cervix, low grade (CIN 1) 05/11/2016  . High risk medication use 05/11/2016  . Thyroid activity decreased 11/19/2014  . Pain, joint, multiple sites 11/19/2014  . Numbness and tingling of left leg 04/06/2014  . Routine general medical examination at a health care facility 09/29/2013  . Osteoporosis, post-menopausal 04/21/2013  . Cough variant asthma 03/17/2013  . Psoriasis 03/17/2013  . Persistent dry cough 03/17/2013  . Osteoarthritis of left knee 03/03/2013  . Psoriasis-like skin disease 03/03/2013  . Eczema 01/07/2013  . Need for prophylactic vaccination and inoculation against influenza 01/07/2013  . Prediabetes 12/17/2012  . Cough 12/17/2012  . Reactive airway disease with wheezing 12/03/2012  . URI, acute 12/03/2012  . Hypothyroidism 10/29/2012  . Unspecified vitamin D deficiency 10/29/2012  . Hyperlipidemia LDL goal <130 10/29/2012  . GERD (gastroesophageal reflux disease) 10/29/2012  . Osteoporosis 10/29/2012  . Myalgia and myositis 10/29/2012    Past Medical History:  Diagnosis Date  . Acute bronchiolitis due to other infectious organisms   . Acute bronchiolitis due to respiratory syncytial virus (RSV)   . Arthritis    OA AND PAIN RIGHT  KNEE  . Degeneration of intervertebral disc, site unspecified   . Disorder of bone and cartilage, unspecified   . Disturbance of skin sensation   . Encounter for long-term (current) use of other medications   . GERD (gastroesophageal reflux disease)    PT STATES SEVERE REFLUX IF SHE DOES NOT TAKE HER OMEPRAZOLE  . Headache(784.0)    WHEN REFLUX SEVERE  . Hypothyroidism   . Long term (current) use of anticoagulants   . Osteoarthrosis, unspecified whether generalized or localized, lower leg   . Osteoporosis, unspecified   . Other and unspecified hyperlipidemia   . Other atopic dermatitis and related conditions   . Other malaise and fatigue   . Other specified pre-operative examination 11/07/2011  . Thoracic or lumbosacral neuritis or radiculitis, unspecified   . Unspecified vitamin D deficiency   . Vegetarian diet     Past Surgical History:  Procedure Laterality Date  . CERVICAL BIOPSY  W/ LOOP ELECTRODE EXCISION    . EYE SURGERY  2016   Left eye, Cataract Removal  . TEETH EXTRACTIONS--NO OTHER SURGERY    . TOTAL KNEE ARTHROPLASTY  11/28/2011   Procedure: TOTAL KNEE ARTHROPLASTY;  Surgeon: Jacki Conesonald A Gioffre, MD;  Location: [facility];  Service: Orthopedics;  Laterality: Right;  . TUBAL LIGATION      Current Outpatient Prescriptions  Medication Sig Dispense Refill  . aspirin 81 MG tablet Take 81 mg by mouth daily.    .Marland Kitchen  calcium carbonate (OS-CAL) 600 MG TABS Take 600 mg by mouth 2 (two) times daily with a meal. Take one tablet twice a day for calcium supplement    . ferrous sulfate 325 (65 FE) MG tablet Take 1 tablet (325 mg total) by mouth daily with breakfast. 30 tablet 3  . gabapentin (NEURONTIN) 300 MG capsule Take 1 capsule by mouth daily.    Marland Kitchen. levothyroxine (SYNTHROID, LEVOTHROID) 25 MCG tablet Take 1.5 tablets (37.5 mcg total) by mouth daily before breakfast. 135 tablet 4  . NONFORMULARY OR COMPOUNDED ITEM Fluorouracil 5 % cream (5 FU) place 2 grams vaginally at bedtime once per  week for 10 weeks. Patient will need vaginal applicators. Dispense 20 grams 0RF. 1 each 0  . omeprazole (PRILOSEC) 20 MG capsule TAKE ONE CAPSULE BY MOUTH ONCE DAILY TO REDUCE  STOMACH ACID 90 capsule 3  . simvastatin (ZOCOR) 20 MG tablet TAKE ONE TABLET BY MOUTH ONCE DAILY FOR CHOLESTEROL 90 tablet 3   No current facility-administered medications for this visit.      ALLERGIES: Other and Prednisolone  Family History  Problem Relation Age of Onset  . Alport syndrome Brother     Social History   Social History  . Marital status: Married    Spouse name: N/A  . Number of children: N/A  . Years of education: N/A   Occupational History  . Not on file.   Social History Main Topics  . Smoking status: Never Smoker  . Smokeless tobacco: Never Used  . Alcohol use No  . Drug use: No  . Sexual activity: Yes    Birth control/ protection: Post-menopausal   Other Topics Concern  . Not on file   Social History Narrative  . No narrative on file    Review of Systems  Constitutional: Negative.   HENT: Negative.   Eyes: Negative.   Respiratory: Negative.   Cardiovascular: Negative.   Gastrointestinal: Negative.   Genitourinary: Negative.   Musculoskeletal: Negative.   Skin: Negative.   Neurological: Negative.   Endo/Heme/Allergies: Negative.   Psychiatric/Behavioral: Negative.     PHYSICAL EXAMINATION:    BP 124/76 (BP Location: Right Arm, Patient Position: Sitting, Cuff Size: Normal)   Pulse 88   Resp 16   Ht 4\' 9"  (1.448 m)   Wt 130 lb (59 kg)   BMI 28.13 kg/m     General appearance: alert, cooperative and appears stated age   Pelvic: External genitalia:  no lesions              Urethra:  normal appearing urethra with no masses, tenderness or lesions              Bartholins and Skenes: normal                 Vagina: normal appearing atrophic vagina with normal color and discharge, no lesions              Cervix: flush with the vagina                Chaperone  was present for exam.  ASSESSMENT Vaginal dysplasia, s/p 8 weekly doses of 5 FU,tolerating well    PLAN 2 more doses of 5 FU Then f/u for a pap in 11/18 The patient may be leaving the country for 6 months around the time she is due for a pap, if so will have her come in early.    An After Visit Summary was printed and given to the  patient.

## 2016-11-29 NOTE — Progress Notes (Unsigned)
Romualdo BolkJertson, Jill Evelyn, MD  Siddalee Vanderheiden, Caroleen HammanKaitlyn E, RN        Please call in 5 FU 2 grams vaginally q week x 2 more doses.  She then needs a pap in 11/18.  Thanks,  Noreene LarssonJill    Rx for Fluorouracil 5 % (5 FU) place 2 grams vaginally at bedtime once per week for 2 weeks. Dispense 4 grams 0RF with note for applicators printed and to Dr.Jertson for review and signature for fax to [facility]. Recall placed for 11/18.

## 2016-11-29 NOTE — Progress Notes (Signed)
Rx for 5 FU faxed to [facility] at [phone].

## 2016-12-05 ENCOUNTER — Ambulatory Visit: Payer: Medicare Other | Admitting: Obstetrics and Gynecology

## 2016-12-06 DIAGNOSIS — H35373 Puckering of macula, bilateral: Secondary | ICD-10-CM | POA: Diagnosis not present

## 2016-12-06 DIAGNOSIS — H43813 Vitreous degeneration, bilateral: Secondary | ICD-10-CM | POA: Diagnosis not present

## 2016-12-06 DIAGNOSIS — H31012 Macula scars of posterior pole (postinflammatory) (post-traumatic), left eye: Secondary | ICD-10-CM | POA: Diagnosis not present

## 2016-12-11 ENCOUNTER — Other Ambulatory Visit: Payer: Medicare Other

## 2016-12-11 DIAGNOSIS — E785 Hyperlipidemia, unspecified: Secondary | ICD-10-CM | POA: Diagnosis not present

## 2016-12-11 DIAGNOSIS — E034 Atrophy of thyroid (acquired): Secondary | ICD-10-CM

## 2016-12-11 DIAGNOSIS — R7303 Prediabetes: Secondary | ICD-10-CM

## 2016-12-11 DIAGNOSIS — Z79899 Other long term (current) drug therapy: Secondary | ICD-10-CM | POA: Diagnosis not present

## 2016-12-11 LAB — CBC WITH DIFFERENTIAL/PLATELET
BASOS ABS: 84 {cells}/uL (ref 0–200)
Basophils Relative: 1 %
EOS PCT: 1 %
Eosinophils Absolute: 84 cells/uL (ref 15–500)
HCT: 40.3 % (ref 35.0–45.0)
HEMOGLOBIN: 13.2 g/dL (ref 11.7–15.5)
LYMPHS ABS: 2940 {cells}/uL (ref 850–3900)
LYMPHS PCT: 35 %
MCH: 30.6 pg (ref 27.0–33.0)
MCHC: 32.8 g/dL (ref 32.0–36.0)
MCV: 93.5 fL (ref 80.0–100.0)
MONOS PCT: 8 %
MPV: 9.6 fL (ref 7.5–12.5)
Monocytes Absolute: 672 cells/uL (ref 200–950)
NEUTROS PCT: 55 %
Neutro Abs: 4620 cells/uL (ref 1500–7800)
PLATELETS: 289 10*3/uL (ref 140–400)
RBC: 4.31 MIL/uL (ref 3.80–5.10)
RDW: 13 % (ref 11.0–15.0)
WBC: 8.4 10*3/uL (ref 3.8–10.8)

## 2016-12-11 LAB — BASIC METABOLIC PANEL WITH GFR
BUN: 13 mg/dL (ref 7–25)
CO2: 23 mmol/L (ref 20–32)
CREATININE: 0.57 mg/dL — AB (ref 0.60–0.93)
Calcium: 9.1 mg/dL (ref 8.6–10.4)
Chloride: 104 mmol/L (ref 98–110)
GFR, Est Non African American: >89 mL/min (ref >=60)
Glucose, Bld: 108 mg/dL — ABNORMAL HIGH (ref 65–99)
Potassium: 4.3 mmol/L (ref 3.5–5.3)
Sodium: 138 mmol/L (ref 135–146)

## 2016-12-11 LAB — LIPID PANEL
CHOLESTEROL: 177 mg/dL (ref <200)
HDL: 56 mg/dL (ref >50)
LDL CALC: 102 mg/dL — AB (ref <100)
TRIGLYCERIDES: 93 mg/dL (ref <150)
Total CHOL/HDL Ratio: 3.2 Ratio (ref <5.0)
VLDL: 19 mg/dL (ref <30)

## 2016-12-11 LAB — TSH: TSH: 2.99 m[IU]/L

## 2016-12-11 LAB — T4, FREE: Free T4: 1.3 ng/dL (ref 0.8–1.8)

## 2016-12-11 LAB — ALT: ALT: 26 U/L (ref 6–29)

## 2016-12-12 LAB — HEMOGLOBIN A1C
HEMOGLOBIN A1C: 5.6 % (ref <5.7)
Mean Plasma Glucose: 114 mg/dL

## 2016-12-13 ENCOUNTER — Ambulatory Visit: Payer: Medicare Other

## 2016-12-14 ENCOUNTER — Ambulatory Visit (INDEPENDENT_AMBULATORY_CARE_PROVIDER_SITE_OTHER): Payer: Medicare Other | Admitting: Internal Medicine

## 2016-12-14 ENCOUNTER — Ambulatory Visit: Payer: Medicare Other

## 2016-12-14 ENCOUNTER — Other Ambulatory Visit: Payer: Medicare Other

## 2016-12-14 ENCOUNTER — Encounter: Payer: Self-pay | Admitting: Internal Medicine

## 2016-12-14 VITALS — BP 102/78 | HR 88 | Temp 97.9°F | Ht <= 58 in | Wt 130.0 lb

## 2016-12-14 DIAGNOSIS — M62838 Other muscle spasm: Secondary | ICD-10-CM

## 2016-12-14 DIAGNOSIS — D649 Anemia, unspecified: Secondary | ICD-10-CM | POA: Diagnosis not present

## 2016-12-14 DIAGNOSIS — E785 Hyperlipidemia, unspecified: Secondary | ICD-10-CM

## 2016-12-14 DIAGNOSIS — E034 Atrophy of thyroid (acquired): Secondary | ICD-10-CM | POA: Diagnosis not present

## 2016-12-14 DIAGNOSIS — E2839 Other primary ovarian failure: Secondary | ICD-10-CM | POA: Diagnosis not present

## 2016-12-14 DIAGNOSIS — N893 Dysplasia of vagina, unspecified: Secondary | ICD-10-CM

## 2016-12-14 DIAGNOSIS — R200 Anesthesia of skin: Secondary | ICD-10-CM

## 2016-12-14 DIAGNOSIS — M810 Age-related osteoporosis without current pathological fracture: Secondary | ICD-10-CM | POA: Diagnosis not present

## 2016-12-14 MED ORDER — GABAPENTIN 300 MG PO CAPS: 300.0000 mg | ORAL_CAPSULE | Freq: Every day | ORAL | 2 refills | Status: DC

## 2016-12-14 MED ORDER — FERROUS SULFATE 325 (65 FE) MG PO TABS: 325.0000 mg | ORAL_TABLET | Freq: Every day | ORAL | 2 refills | Status: DC

## 2016-12-14 MED ORDER — SIMVASTATIN 20 MG PO TABS: ORAL_TABLET | ORAL | 2 refills | Status: DC

## 2016-12-14 MED ORDER — LEVOTHYROXINE SODIUM 25 MCG PO TABS: 37.5000 ug | ORAL_TABLET | Freq: Every day | ORAL | 2 refills | Status: DC

## 2016-12-14 MED ORDER — OMEPRAZOLE 20 MG PO CPDR: DELAYED_RELEASE_CAPSULE | ORAL | 2 refills | Status: DC

## 2016-12-14 NOTE — Progress Notes (Signed)
Patient ID: [name] [name], female   DOB: 01-Apr-1946, 71 y.o.   MRN: [id number]    Location:  [facility] Place of Service: OFFICE  Chief Complaint  Patient presents with  . Medical Management of Chronic Issues    6 month follow-up and discuss labs (copy printed), and problem with hands (feels like something inside of hands off/on), here with Husband and Interputer.   Marland Kitchen Health Maintenance    Mammogram scheduled for 12/17/16, refused Hep C screening and Shingles Vaccine   . Medication Refill    Renew all medication x 90 day     HPI:  71 yo female seen today for f/u. She continues to have numbness in her hands. She is not willing to try gabapentin. No change in thigh spasm or right knee pain. No relief with PT. No relief with meloxicam. She and her spouse are traveling to visit daughter in [place] this month and then will be traveling to [place] from 02/18/17 - 07/30/17. Spouse and interpreter present today.  GERD - controlled on omeprazole. She does have bloating  Cervical CIN I/ vaginal dysplasia - s/p LEEP in 2017 due to unsatisfactory colposcopy for abnormal pap with LGSIL with dysplasia with cells s/o high grade. She completed 10 weeks of 5 FU. Followed by GYN  Hypothyroidism -  stable on levothyroxine. No CP, palpitations, fatigue, SOB, change in bowel habits  Hyperlipidemia - stable on statin tx. No myalgias. LDL 81. She takes ASA daily  Joint swelling with hx right knee OA s/p TKR - she has muscle spasms of left thigh and has seen Ortho. She reports no injections given but was sent for PT which did not help. She would like a 2nd opinion. She has not taken meloxicam in several mos.  She has gotten prolia injection q67mo for osteoporosis. Insurance denied coverage and she has been paying out-of-pocket  Past Medical History:  Diagnosis Date  . Acute bronchiolitis due to other infectious organisms   . Acute bronchiolitis due to respiratory syncytial virus (RSV)   . Arthritis    OA AND PAIN  RIGHT KNEE  . Degeneration of intervertebral disc, site unspecified   . Disorder of bone and cartilage, unspecified   . Disturbance of skin sensation   . Encounter for long-term (current) use of other medications   . GERD (gastroesophageal reflux disease)    PT STATES SEVERE REFLUX IF SHE DOES NOT TAKE HER OMEPRAZOLE  . Headache(784.0)    WHEN REFLUX SEVERE  . Hypothyroidism   . Long term (current) use of anticoagulants   . Osteoarthrosis, unspecified whether generalized or localized, lower leg   . Osteoporosis, unspecified   . Other and unspecified hyperlipidemia   . Other atopic dermatitis and related conditions   . Other malaise and fatigue   . Other specified pre-operative examination 11/07/2011  . Thoracic or lumbosacral neuritis or radiculitis, unspecified   . Unspecified vitamin D deficiency   . Vegetarian diet     Past Surgical History:  Procedure Laterality Date  . CERVICAL BIOPSY  W/ LOOP ELECTRODE EXCISION    . EYE SURGERY  2016   Left eye, Cataract Removal  . TEETH EXTRACTIONS--NO OTHER SURGERY    . TOTAL KNEE ARTHROPLASTY  11/28/2011   Procedure: TOTAL KNEE ARTHROPLASTY;  Surgeon: RTobi Bastos MD;  Location: [facility];  Service: Orthopedics;  Laterality: Right;  . TUBAL LIGATION      Patient Care Team: CGildardo Cranker DO as PCP - General (Internal Medicine)  Social History  Social History  . Marital status: Married    Spouse name: N/A  . Number of children: N/A  . Years of education: N/A   Occupational History  . Not on file.   Social History Main Topics  . Smoking status: Never Smoker  . Smokeless tobacco: Never Used  . Alcohol use No  . Drug use: No  . Sexual activity: Yes    Birth control/ protection: Post-menopausal   Other Topics Concern  . Not on file   Social History Narrative  . No narrative on file     reports that she has never smoked. She has never used smokeless tobacco. She reports that she does not drink alcohol or use  drugs.  Family History  Problem Relation Age of Onset  . Alport syndrome Brother    Family Status  Relation Status  . Mother Deceased  . Father Deceased  . Sister Alive  . Brother Alive  . Daughter Alive  . Son Alive  . Brother Alive  . Sister Alive  . Brother Alive  . Daughter Alive     Allergies  Allergen Reactions  . Other Itching and Swelling    Patient had an allergy to an eye drop, she does not know the name of the drop.  . Prednisolone Swelling    Medications: Patient's Medications  New Prescriptions   No medications on file  Previous Medications   ASPIRIN 81 MG TABLET    Take 81 mg by mouth daily.   CALCIUM CARBONATE (OS-CAL) 600 MG TABS    Take 600 mg by mouth 2 (two) times daily with a meal. Take one tablet twice a day for calcium supplement   FERROUS SULFATE 325 (65 FE) MG TABLET    Take 1 tablet (325 mg total) by mouth daily with breakfast.   GABAPENTIN (NEURONTIN) 300 MG CAPSULE    Take 1 capsule by mouth daily.   LEVOTHYROXINE (SYNTHROID, LEVOTHROID) 25 MCG TABLET    Take 1.5 tablets (37.5 mcg total) by mouth daily before breakfast.   NONFORMULARY OR COMPOUNDED ITEM    Fluorouracil 5 % cream (5 FU) place 2 grams vaginally at bedtime once per week for 2 weeks. Patient will need vaginal applicators. Dispense 4 grams 0RF.   OMEPRAZOLE (PRILOSEC) 20 MG CAPSULE    TAKE ONE CAPSULE BY MOUTH ONCE DAILY TO REDUCE  STOMACH ACID   SIMVASTATIN (ZOCOR) 20 MG TABLET    TAKE ONE TABLET BY MOUTH ONCE DAILY FOR CHOLESTEROL  Modified Medications   No medications on file  Discontinued Medications   No medications on file    Review of Systems  Musculoskeletal: Positive for arthralgias and joint swelling.  Neurological: Positive for numbness.  All other systems reviewed and are negative.   Vitals:   12/14/16 0840  BP: 102/78  Pulse: 88  Temp: 97.9 F (36.6 C)  TempSrc: Oral  SpO2: 97%  Weight: 130 lb (59 kg)  Height: _0  (1.448 m)   Body mass index is 28.13  kg/m.  Physical Exam  Constitutional: She is oriented to person, place, and time. She appears well-developed and well-nourished.  HENT:  Mouth/Throat: Oropharynx is clear and moist. No oropharyngeal exudate.  MMM; no oral thrush  Eyes: Pupils are equal, round, and reactive to light. No scleral icterus.  Neck: Neck supple. Carotid bruit is not present. No tracheal deviation present. No thyromegaly present.  Cardiovascular: Normal rate, regular rhythm and intact distal pulses.  Exam reveals no gallop and no friction rub.  Murmur (1/6 SEM) heard. No LE edema b/l. no calf TTP.   Pulmonary/Chest: Effort normal and breath sounds normal. No stridor. No respiratory distress. She has no wheezes. She has no rales.  Abdominal: Soft. Normal appearance and bowel sounds are normal. She exhibits no distension and no mass. There is no hepatomegaly. There is no tenderness. There is no rigidity, no rebound and no guarding. No hernia.  Musculoskeletal: She exhibits edema and tenderness.  Neg Tinel's b/l  Lymphadenopathy:    She has no cervical adenopathy.  Neurological: She is alert and oriented to person, place, and time. She has normal reflexes.  Skin: Skin is warm and dry. No rash noted.  Psychiatric: She has a normal mood and affect. Her behavior is normal. Judgment and thought content normal.     Labs reviewed: Appointment on 12/11/2016  Component Date Value Ref Range Status  . Sodium 12/11/2016 138  135 - 146 mmol/L Final  . Potassium 12/11/2016 4.3  3.5 - 5.3 mmol/L Final  . Chloride 12/11/2016 104  98 - 110 mmol/L Final  . CO2 12/11/2016 23  20 - 32 mmol/L Final   Comment: ** Please note change in reference range(s). **     . Glucose, Bld 12/11/2016 108* 65 - 99 mg/dL Final  . BUN 12/11/2016 13  7 - 25 mg/dL Final  . Creat 12/11/2016 0.57* 0.60 - 0.93 mg/dL Final   Comment:   For patients > or = 73 years of age: The upper reference limit for Creatinine is approximately 13% higher for  people identified as African-American.     . Calcium 12/11/2016 9.1  8.6 - 10.4 mg/dL Final  . GFR, Est African American 12/11/2016 >89  >=60 mL/min Final  . GFR, Est Non African American 12/11/2016 >89  >=60 mL/min Final  . ALT 12/11/2016 26  6 - 29 U/L Final  . WBC 12/11/2016 8.4  3.8 - 10.8 K/uL Final   Few atypical lymphocytes noted.  Marland Kitchen RBC 12/11/2016 4.31  3.80 - 5.10 MIL/uL Final  . Hemoglobin 12/11/2016 13.2  11.7 - 15.5 g/dL Final  . HCT 12/11/2016 40.3  35.0 - 45.0 % Final  . MCV 12/11/2016 93.5  80.0 - 100.0 fL Final  . MCH 12/11/2016 30.6  27.0 - 33.0 pg Final  . MCHC 12/11/2016 32.8  32.0 - 36.0 g/dL Final  . RDW 12/11/2016 13.0  11.0 - 15.0 % Final  . Platelets 12/11/2016 289  140 - 400 K/uL Final  . MPV 12/11/2016 9.6  7.5 - 12.5 fL Final  . Neutro Abs 12/11/2016 4620  1,500 - 7,800 cells/uL Final  . Lymphs Abs 12/11/2016 2940  850 - 3,900 cells/uL Final  . Monocytes Absolute 12/11/2016 672  200 - 950 cells/uL Final  . Eosinophils Absolute 12/11/2016 84  15 - 500 cells/uL Final  . Basophils Absolute 12/11/2016 84  0 - 200 cells/uL Final  . Neutrophils Relative % 12/11/2016 55  % Final  . Lymphocytes Relative 12/11/2016 35  % Final  . Monocytes Relative 12/11/2016 8  % Final  . Eosinophils Relative 12/11/2016 1  % Final  . Basophils Relative 12/11/2016 1  % Final  . Smear Review 12/11/2016 Criteria for review not met   Final  . TSH 12/11/2016 2.99  mIU/L Final   Comment:   Reference Range   > or = 20 Years  0.40-4.50   Pregnancy Range First trimester  0.26-2.66 Second trimester 0.55-2.73 Third trimester  0.43-2.91     . Free  T4 12/11/2016 1.3  0.8 - 1.8 ng/dL Final  . Hgb A1c MFr Bld 12/11/2016 5.6  <5.7 % Final   Comment:   For the purpose of screening for the presence of diabetes:   <5.7%       Consistent with the absence of diabetes 5.7-6.4 %   Consistent with increased risk for diabetes (prediabetes) >=6.5 %     Consistent with diabetes   This assay  result is consistent with a decreased risk of diabetes.   Currently, no consensus exists regarding use of hemoglobin A1c for diagnosis of diabetes in children.   According to American Diabetes Association (ADA) guidelines, hemoglobin A1c <7.0% represents optimal control in non-pregnant diabetic patients. Different metrics may apply to specific patient populations. Standards of Medical Care in Diabetes (ADA).     . Mean Plasma Glucose 12/11/2016 114  mg/dL Final  . Cholesterol 12/11/2016 177  <200 mg/dL Final  . Triglycerides 12/11/2016 93  <150 mg/dL Final  . HDL 12/11/2016 56  >50 mg/dL Final  . Total CHOL/HDL Ratio 12/11/2016 3.2  <5.0 Ratio Final  . VLDL 12/11/2016 19  <30 mg/dL Final  . LDL Cholesterol 12/11/2016 102* <100 mg/dL Final    No results found.   Assessment/Plan   ICD-10-CM   1. Age-related osteoporosis without current pathological fracture M81.0 DG Bone Density  2. Bilateral hand numbness R20.0 gabapentin (NEURONTIN) 300 MG capsule  3. Hypothyroidism due to acquired atrophy of thyroid E03.4 levothyroxine (SYNTHROID, LEVOTHROID) 25 MCG tablet  4. Muscle spasm of left lower extremity M62.838   5. Hyperlipidemia LDL goal <130 E78.5 simvastatin (ZOCOR) 20 MG tablet  6. Vaginal dysplasia N89.3   7. Estrogen deficiency E28.39 DG Bone Density  8. Anemia, unspecified type D64.9 ferrous sulfate 325 (65 FE) MG tablet   Continue current medications as ordered. She has not been taking gabapentin but will start  Follow up in September for Prolia injection  Will call with bone density appt  Follow up with GYN for pap smear. Please call for an appt before October travel plans  Follow up in April 2019 for AWV/CPE. Fasting labs prior to appt (cmp, lipid panel, TSH, t4 free, cbc w diff, ua)   Larena Ohnemus S. Perlie Gold  [facility] [address] [place], [place] [address] [phone] Cell (Monday-Friday 8 AM - 5  PM) [phone] After 5 PM and follow prompts

## 2016-12-14 NOTE — Patient Instructions (Signed)
Continue current medications as ordered  Follow up in September for Prolia injection  Will call with bone density appt  Follow up with GYN for pap smear. Please call for an appt before October travel plans  Follow up in April 2019 for AWV/CPE. Fasting labs prior to appt

## 2016-12-17 ENCOUNTER — Ambulatory Visit
Admission: RE | Admit: 2016-12-17 | Discharge: 2016-12-17 | Disposition: A | Discharge status: Home / Self Care | Payer: Medicare Other | Source: Ambulatory Visit | Attending: Internal Medicine | Admitting: Internal Medicine

## 2016-12-17 DIAGNOSIS — Z1231 Encounter for screening mammogram for malignant neoplasm of breast: Secondary | ICD-10-CM | POA: Diagnosis not present

## 2016-12-19 ENCOUNTER — Ambulatory Visit: Payer: Medicare Other | Admitting: Internal Medicine

## 2017-01-02 ENCOUNTER — Ambulatory Visit: Payer: Medicare Other | Admitting: Obstetrics and Gynecology

## 2017-01-21 ENCOUNTER — Ambulatory Visit: Payer: Medicare Other

## 2017-01-23 ENCOUNTER — Ambulatory Visit: Payer: Medicare Other | Admitting: Internal Medicine

## 2017-01-24 ENCOUNTER — Ambulatory Visit: Payer: Self-pay

## 2017-01-24 ENCOUNTER — Ambulatory Visit (INDEPENDENT_AMBULATORY_CARE_PROVIDER_SITE_OTHER): Payer: Medicare Other

## 2017-01-24 DIAGNOSIS — M810 Age-related osteoporosis without current pathological fracture: Secondary | ICD-10-CM

## 2017-01-24 MED ORDER — DENOSUMAB 60 MG/ML ~~LOC~~ SOLN
60.0000 mg | Freq: Once | SUBCUTANEOUS | Status: AC
  Administered 2017-01-24: 60 mg via SUBCUTANEOUS

## 2017-01-25 ENCOUNTER — Ambulatory Visit
Admission: RE | Admit: 2017-01-25 | Discharge: 2017-01-25 | Disposition: A | Discharge status: Home / Self Care | Payer: Medicare Other | Source: Ambulatory Visit | Attending: Internal Medicine | Admitting: Internal Medicine

## 2017-01-25 DIAGNOSIS — E2839 Other primary ovarian failure: Secondary | ICD-10-CM

## 2017-01-25 DIAGNOSIS — M810 Age-related osteoporosis without current pathological fracture: Secondary | ICD-10-CM

## 2017-01-25 DIAGNOSIS — M8589 Other specified disorders of bone density and structure, multiple sites: Secondary | ICD-10-CM | POA: Diagnosis not present

## 2017-01-25 DIAGNOSIS — Z780 Asymptomatic menopausal state: Secondary | ICD-10-CM | POA: Diagnosis not present

## 2017-01-31 DIAGNOSIS — G8929 Other chronic pain: Secondary | ICD-10-CM | POA: Diagnosis not present

## 2017-01-31 DIAGNOSIS — M25561 Pain in right knee: Secondary | ICD-10-CM | POA: Diagnosis not present

## 2017-02-03 ENCOUNTER — Encounter (HOSPITAL_COMMUNITY): Payer: Self-pay | Admitting: Emergency Medicine

## 2017-02-03 ENCOUNTER — Emergency Department (HOSPITAL_COMMUNITY)
Admission: EM | Admit: 2017-02-03 | Discharge: 2017-02-03 | Disposition: A | Discharge status: Home / Self Care | Payer: Medicare Other | Attending: Emergency Medicine | Admitting: Emergency Medicine

## 2017-02-03 ENCOUNTER — Emergency Department (HOSPITAL_COMMUNITY): Payer: Medicare Other

## 2017-02-03 DIAGNOSIS — Z79899 Other long term (current) drug therapy: Secondary | ICD-10-CM | POA: Diagnosis not present

## 2017-02-03 DIAGNOSIS — R079 Chest pain, unspecified: Secondary | ICD-10-CM | POA: Diagnosis not present

## 2017-02-03 DIAGNOSIS — R0781 Pleurodynia: Secondary | ICD-10-CM | POA: Insufficient documentation

## 2017-02-03 DIAGNOSIS — M542 Cervicalgia: Secondary | ICD-10-CM | POA: Diagnosis not present

## 2017-02-03 DIAGNOSIS — M62838 Other muscle spasm: Secondary | ICD-10-CM

## 2017-02-03 DIAGNOSIS — R0789 Other chest pain: Secondary | ICD-10-CM | POA: Diagnosis not present

## 2017-02-03 DIAGNOSIS — Z7982 Long term (current) use of aspirin: Secondary | ICD-10-CM | POA: Insufficient documentation

## 2017-02-03 LAB — I-STAT CHEM 8, ED
BUN: 8 mg/dL (ref 6–20)
CHLORIDE: 101 mmol/L (ref 101–111)
Calcium, Ion: 1.18 mmol/L (ref 1.15–1.40)
Creatinine, Ser: 0.5 mg/dL (ref 0.44–1.00)
GLUCOSE: 126 mg/dL — AB (ref 65–99)
HCT: 43 % (ref 36.0–46.0)
Hemoglobin: 14.6 g/dL (ref 12.0–15.0)
POTASSIUM: 3.7 mmol/L (ref 3.5–5.1)
Sodium: 139 mmol/L (ref 135–145)
TCO2: 28 mmol/L (ref 22–32)

## 2017-02-03 LAB — CBC
HCT: 41.2 % (ref 36.0–46.0)
Hemoglobin: 14.2 g/dL (ref 12.0–15.0)
MCH: 31.3 pg (ref 26.0–34.0)
MCHC: 34.5 g/dL (ref 30.0–36.0)
MCV: 90.7 fL (ref 78.0–100.0)
PLATELETS: 295 10*3/uL (ref 150–400)
RBC: 4.54 MIL/uL (ref 3.87–5.11)
RDW: 12.6 % (ref 11.5–15.5)
WBC: 10.9 10*3/uL — AB (ref 4.0–10.5)

## 2017-02-03 LAB — I-STAT TROPONIN, ED: Troponin i, poc: 0.01 ng/mL (ref 0.00–0.08)

## 2017-02-03 LAB — D-DIMER, QUANTITATIVE (NOT AT ARMC): D DIMER QUANT: 0.44 ug{FEU}/mL (ref 0.00–0.50)

## 2017-02-03 MED ORDER — ASPIRIN 81 MG PO CHEW
324.0000 mg | CHEWABLE_TABLET | Freq: Once | ORAL | Status: AC
  Administered 2017-02-03: 324 mg via ORAL
  Filled 2017-02-03: qty 4

## 2017-02-03 MED ORDER — METHOCARBAMOL 500 MG PO TABS: 500.0000 mg | ORAL_TABLET | Freq: Two times a day (BID) | ORAL | 0 refills | Status: DC

## 2017-02-03 MED ORDER — DIAZEPAM 2 MG PO TABS
2.0000 mg | ORAL_TABLET | Freq: Once | ORAL | Status: AC
  Administered 2017-02-03: 2 mg via ORAL
  Filled 2017-02-03: qty 1

## 2017-02-03 MED ORDER — LIDOCAINE 5 % EX PTCH: 1.0000 | MEDICATED_PATCH | CUTANEOUS | 0 refills | Status: DC

## 2017-02-03 MED ORDER — IBUPROFEN 200 MG PO TABS
600.0000 mg | ORAL_TABLET | Freq: Once | ORAL | Status: AC
  Administered 2017-02-03: 600 mg via ORAL
  Filled 2017-02-03: qty 3

## 2017-02-03 MED ORDER — ACETAMINOPHEN ER 650 MG PO TBCR: 650.0000 mg | EXTENDED_RELEASE_TABLET | Freq: Three times a day (TID) | ORAL | 0 refills | Status: DC | PRN

## 2017-02-03 MED ORDER — IBUPROFEN 400 MG PO TABS: 400.0000 mg | ORAL_TABLET | Freq: Four times a day (QID) | ORAL | 0 refills | Status: DC | PRN

## 2017-02-03 NOTE — Discharge Instructions (Signed)
Please return to the ER if you have worsening chest pain, shortness of breath, pain radiating to your jaw, shoulder, or back, sweats or fainting. Otherwise see your primary care doctor as requested. ° °

## 2017-02-03 NOTE — ED Notes (Signed)
ED Provider at bedside. 

## 2017-02-03 NOTE — ED Triage Notes (Signed)
Patient c/o left sided neck pain radiating down left side since yesterday. Reports pain worsens with movement. Ambulatory. Denies chest pain and SOB.

## 2017-02-03 NOTE — ED Notes (Signed)
Patient transported to X-ray 

## 2017-02-03 NOTE — ED Provider Notes (Signed)
[facility]-EMERGENCY DEPT Provider Note   CSN: [id number] Arrival date & time: 02/03/17  1000     History   Chief Complaint Chief Complaint  Patient presents with  . Neck Pain    HPI [name] [name] is a 71 y.o. female.  HPI Pt comes in with cc of neck pain and chest pain. Pt reports 5 days of chest pain. Chest pain is constant, and below her L breast. Pain is worse with certain position and when she takes a deep breat her pain is worse. Patient has no pain with urination, blood in the urine, or frequent urination. Pt has no cough, dib.  Pt also c/o neck pain, that started 2 days ago. The neck pain is in the paraspinal region on the L side and moves towards the scapular region. The pain in the neck is worse with movement. No trauma.   Past Medical History:  Diagnosis Date  . Acute bronchiolitis due to other infectious organisms   . Acute bronchiolitis due to respiratory syncytial virus (RSV)   . Arthritis    OA AND PAIN RIGHT KNEE  . Degeneration of intervertebral disc, site unspecified   . Disorder of bone and cartilage, unspecified   . Disturbance of skin sensation   . Encounter for long-term (current) use of other medications   . GERD (gastroesophageal reflux disease)    PT STATES SEVERE REFLUX IF SHE DOES NOT TAKE HER OMEPRAZOLE  . Headache(784.0)    WHEN REFLUX SEVERE  . Hypothyroidism   . Long term (current) use of anticoagulants   . Osteoarthrosis, unspecified whether generalized or localized, lower leg   . Osteoporosis, unspecified   . Other and unspecified hyperlipidemia   . Other atopic dermatitis and related conditions   . Other malaise and fatigue   . Other specified pre-operative examination 11/07/2011  . Thoracic or lumbosacral neuritis or radiculitis, unspecified   . Unspecified vitamin D deficiency   . Vegetarian diet     Patient Active Problem List   Diagnosis Date Noted  . Estrogen deficiency 12/14/2016  . Anemia 12/14/2016  . Vaginal dysplasia  09/20/2016  . Muscle spasm of left lower extremity 05/11/2016  . Swelling of joint of left knee 05/11/2016  . Dysplasia of cervix, low grade (CIN 1) 05/11/2016  . High risk medication use 05/11/2016  . Thyroid activity decreased 11/19/2014  . Pain, joint, multiple sites 11/19/2014  . Numbness and tingling of left leg 04/06/2014  . Routine general medical examination at a health care facility 09/29/2013  . Osteoporosis, post-menopausal 04/21/2013  . Cough variant asthma 03/17/2013  . Psoriasis 03/17/2013  . Persistent dry cough 03/17/2013  . Osteoarthritis of left knee 03/03/2013  . Psoriasis-like skin disease 03/03/2013  . Eczema 01/07/2013  . Need for prophylactic vaccination and inoculation against influenza 01/07/2013  . Prediabetes 12/17/2012  . Cough 12/17/2012  . Reactive airway disease with wheezing 12/03/2012  . URI, acute 12/03/2012  . Hypothyroidism 10/29/2012  . Unspecified vitamin D deficiency 10/29/2012  . Hyperlipidemia LDL goal <130 10/29/2012  . GERD (gastroesophageal reflux disease) 10/29/2012  . Osteoporosis 10/29/2012  . Myalgia and myositis 10/29/2012    Past Surgical History:  Procedure Laterality Date  . CERVICAL BIOPSY  W/ LOOP ELECTRODE EXCISION    . EYE SURGERY  2016   Left eye, Cataract Removal  . TEETH EXTRACTIONS--NO OTHER SURGERY    . TOTAL KNEE ARTHROPLASTY  11/28/2011   Procedure: TOTAL KNEE ARTHROPLASTY;  Surgeon: Jacki Cones, MD;  Location: [facility];  Service: Orthopedics;  Laterality: Right;  . TUBAL LIGATION      OB History    Gravida Para Term Preterm AB Living   SAB TAB Ectopic Multiple Live Births           3       Home Medications    Prior to Admission medications   Medication Sig Start Date End Date Taking? Authorizing Provider  acetaminophen (TYLENOL 8 HOUR) 650 MG CR tablet Take 1 tablet (650 mg total) by mouth every 8 (eight) hours as needed for pain. 02/03/17   Derwood Kaplan, MD  aspirin 81 MG tablet  Take 81 mg by mouth daily.    [provider]  calcium carbonate (OS-CAL) 600 MG TABS Take 600 mg by mouth 2 (two) times daily with a meal. Take one tablet twice a day for calcium supplement    [provider]  ferrous sulfate 325 (65 FE) MG tablet Take 1 tablet (325 mg total) by mouth daily with breakfast. 12/14/16   Kirt Boys, DO  gabapentin (NEURONTIN) 300 MG capsule Take 1 capsule (300 mg total) by mouth daily. 12/14/16   Kirt Boys, DO  ibuprofen (ADVIL,MOTRIN) 400 MG tablet Take 1 tablet (400 mg total) by mouth every 6 (six) hours as needed. 02/03/17   Derwood Kaplan, MD  levothyroxine (SYNTHROID, LEVOTHROID) 25 MCG tablet Take 1.5 tablets (37.5 mcg total) by mouth daily before breakfast. 12/14/16   Kirt Boys, DO  lidocaine (LIDODERM) 5 % Place 1 patch onto the skin daily. Remove & Discard patch within 12 hours or as directed by MD 02/03/17   Derwood Kaplan, MD  methocarbamol (ROBAXIN) 500 MG tablet Take 1 tablet (500 mg total) by mouth 2 (two) times daily. 02/03/17   Derwood Kaplan, MD  NONFORMULARY OR COMPOUNDED ITEM Fluorouracil 5 % cream (5 FU) place 2 grams vaginally at bedtime once per week for 2 weeks. Patient will need vaginal applicators. Dispense 4 grams 0RF. 11/29/16   Romualdo Bolk, MD  omeprazole (PRILOSEC) 20 MG capsule TAKE ONE CAPSULE BY MOUTH ONCE DAILY TO REDUCE  STOMACH ACID 12/14/16   Kirt Boys, DO  simvastatin (ZOCOR) 20 MG tablet TAKE ONE TABLET BY MOUTH ONCE DAILY FOR CHOLESTEROL 12/14/16   Kirt Boys, DO    Family History Family History  Problem Relation Age of Onset  . Alport syndrome Brother   . Breast cancer Neg Hx     Social History Social History  Substance Use Topics  . Smoking status: Never Smoker  . Smokeless tobacco: Never Used  . Alcohol use No     Allergies   Other and Prednisolone   Review of Systems Review of Systems  Constitutional: Negative for activity change.  Respiratory: Negative for cough  and shortness of breath.   Cardiovascular: Positive for chest pain.  Genitourinary: Negative for dysuria.  Musculoskeletal: Positive for neck pain.  Allergic/Immunologic: Negative for immunocompromised state.  Hematological: Does not bruise/bleed easily.  All other systems reviewed and are negative.    Physical Exam Updated Vital Signs BP (!) 148/85 (BP Location: Right Arm)   Pulse 79   Temp 97.9 F (36.6 C) (Oral)   Resp (!) 21   SpO2 99%   Physical Exam  Constitutional: She is oriented to person, place, and time. She appears well-developed.  HENT:  Head: Normocephalic and atraumatic.  Eyes: EOM are normal.  Neck: Normal range of motion. Neck supple.  Cardiovascular:  Normal rate.   No murmur heard. Pulmonary/Chest: Effort normal. No respiratory distress.  Abdominal: Bowel sounds are normal.  Musculoskeletal:  Pt has lower cervical spine tenderness. She has paraspinal tenderness on the L side and there is palpable spasms, upon manipulation of those spasm the pain is worse.  Neurological: She is alert and oriented to person, place, and time.  Skin: Skin is warm and dry.  Nursing note and vitals reviewed.    ED Treatments / Results  Labs (all labs ordered are listed, but only abnormal results are displayed) Labs Reviewed  CBC - Abnormal; Notable for the following:       Result Value   WBC 10.9 (*)    All other components within normal limits  I-STAT CHEM 8, ED - Abnormal; Notable for the following:    Glucose, Bld 126 (*)    All other components within normal limits  D-DIMER, QUANTITATIVE (NOT AT [facility])  I-STAT TROPONIN, ED    EKG  EKG Interpretation  Date/Time:  Sunday February 03 2017 17:40:21 EDT Ventricular Rate:  82 PR Interval:    QRS Duration: 86 QT Interval:  352 QTC Calculation: 412 R Axis:   33 Text Interpretation:  Sinus rhythm No old tracing to compare No acute changes Confirmed by Derwood Kaplan [phone]) on 02/03/2017 5:52:00 PM        Radiology Dg Chest 2 View  Result Date: 02/03/2017 CLINICAL DATA:  Chest pain EXAM: CHEST  2 VIEW COMPARISON:  None. FINDINGS: The heart size and mediastinal contours are within normal limits. Both lungs are clear. The visualized skeletal structures are unremarkable. IMPRESSION: No active cardiopulmonary disease. Electronically Signed   By: Deatra Robinson M.D.   On: 02/03/2017 18:30   Dg Cervical Spine 2-3 Views  Result Date: 02/03/2017 CLINICAL DATA:  Left-sided neck pain. EXAM: CERVICAL SPINE - 2-3 VIEW COMPARISON:  None. FINDINGS: There is no evidence of cervical spine fracture or prevertebral soft tissue swelling. Alignment is normal. No other significant bone abnormalities are identified. IMPRESSION: Negative cervical spine radiographs. Electronically Signed   By: Signa Kell M.D.   On: 02/03/2017 18:29    Procedures Procedures (including critical care time)  Medications Ordered in ED Medications  aspirin chewable tablet 324 mg (324 mg Oral Given 02/03/17 1735)  diazepam (VALIUM) tablet 2 mg (2 mg Oral Given 02/03/17 1740)  ibuprofen (ADVIL,MOTRIN) tablet 600 mg (600 mg Oral Given 02/03/17 1734)     Initial Impression / Assessment and Plan / ED Course  I have reviewed the triage vital signs and the nursing notes.  Pertinent labs & imaging results that were available during my care of the patient were reviewed by me and considered in my medical decision making (see chart for details).  Clinical Course as of Feb 04 1840  Wynelle Link Feb 03, 2017  6045 Results from the ER workup discussed with the patient face to face and all questions answered to the best of my ability.  Pt has no UTI like symptoms. We dont need UA. Also, pt has been in the waiting room for 7 hours, I dont think serial trops are needed.  Strict ER return precautions have been discussed, and patient is agreeing with the plan and is comfortable with the workup done and the recommendations from the ER.   [AN]     Clinical Course User Index [AN] Derwood Kaplan, MD   Pt comes in with cc of chest pain, L sided x 5 days, constant. Pain is constant, not exertional,  in fact pain is better when she is working and worse when she is laying on the L side. Pt does have some pleuritic component to the pain and we ordered dimer, which is neg. No uti like symptoms. No flank pain or abd pain - which is reassuring.  Final Clinical Impressions(s) / ED Diagnoses   Final diagnoses:  Muscle spasms of neck  Pleuritic chest pain  Chest wall pain    New Prescriptions New Prescriptions   ACETAMINOPHEN (TYLENOL 8 HOUR) 650 MG CR TABLET    Take 1 tablet (650 mg total) by mouth every 8 (eight) hours as needed for pain.   IBUPROFEN (ADVIL,MOTRIN) 400 MG TABLET    Take 1 tablet (400 mg total) by mouth every 6 (six) hours as needed.   LIDOCAINE (LIDODERM) 5 %    Place 1 patch onto the skin daily. Remove & Discard patch within 12 hours or as directed by MD   METHOCARBAMOL (ROBAXIN) 500 MG TABLET    Take 1 tablet (500 mg total) by mouth 2 (two) times daily.     Derwood Kaplan, MD 02/03/17 [phone]

## 2017-02-03 NOTE — ED Notes (Signed)
Patient educated about not driving or performing other critical tasks (such as operating heavy machinery, caring for infant/toddler/child) due to sedative nature of medications received while in the ED.  Pt/caregiver verbalized understanding.

## 2017-02-04 ENCOUNTER — Other Ambulatory Visit (HOSPITAL_COMMUNITY): Payer: Self-pay | Admitting: Orthopedic Surgery

## 2017-02-04 DIAGNOSIS — M25561 Pain in right knee: Principal | ICD-10-CM

## 2017-02-04 DIAGNOSIS — G8929 Other chronic pain: Secondary | ICD-10-CM

## 2017-02-04 MED ORDER — TIZANIDINE HCL 4 MG PO TABS: 4.0000 mg | ORAL_TABLET | Freq: Three times a day (TID) | ORAL | 0 refills | Status: DC | PRN

## 2017-02-05 ENCOUNTER — Other Ambulatory Visit: Payer: Self-pay | Admitting: Internal Medicine

## 2017-02-07 ENCOUNTER — Ambulatory Visit (INDEPENDENT_AMBULATORY_CARE_PROVIDER_SITE_OTHER): Payer: Medicare Other | Admitting: Obstetrics and Gynecology

## 2017-02-07 ENCOUNTER — Other Ambulatory Visit (HOSPITAL_COMMUNITY)
Admission: RE | Admit: 2017-02-07 | Discharge: 2017-02-07 | Disposition: A | Discharge status: Home / Self Care | Payer: Medicare Other | Source: Ambulatory Visit | Attending: Obstetrics and Gynecology | Admitting: Obstetrics and Gynecology

## 2017-02-07 ENCOUNTER — Encounter: Payer: Self-pay | Admitting: Obstetrics and Gynecology

## 2017-02-07 VITALS — BP 104/64 | HR 88 | Resp 16 | Wt 131.0 lb

## 2017-02-07 DIAGNOSIS — Z87411 Personal history of vaginal dysplasia: Secondary | ICD-10-CM | POA: Insufficient documentation

## 2017-02-07 DIAGNOSIS — Z124 Encounter for screening for malignant neoplasm of cervix: Secondary | ICD-10-CM | POA: Insufficient documentation

## 2017-02-07 DIAGNOSIS — Z87410 Personal history of cervical dysplasia: Secondary | ICD-10-CM | POA: Insufficient documentation

## 2017-02-07 DIAGNOSIS — Z1272 Encounter for screening for malignant neoplasm of vagina: Secondary | ICD-10-CM | POA: Insufficient documentation

## 2017-02-07 NOTE — Progress Notes (Signed)
GYNECOLOGY  VISIT   HPI: 71 y.o.   Married  [place]  female   [phone] with No LMP recorded. Patient is postmenopausal.   here for follow up vaginal  Dysplasia, s/p treatment with 5 FU.  She and her husband are going to travel for the next 6 months in [place], possibly [place].   GYNECOLOGIC HISTORY: No LMP recorded. Patient is postmenopausal. Contraception:postmenopause  Menopausal hormone therapy: none         OB History    Gravida Para Term Preterm AB Living   SAB TAB Ectopic Multiple Live Births           3         Patient Active Problem List   Diagnosis Date Noted  . Estrogen deficiency 12/14/2016  . Anemia 12/14/2016  . Vaginal dysplasia 09/20/2016  . Muscle spasm of left lower extremity 05/11/2016  . Swelling of joint of left knee 05/11/2016  . Dysplasia of cervix, low grade (CIN 1) 05/11/2016  . High risk medication use 05/11/2016  . Thyroid activity decreased 11/19/2014  . Pain, joint, multiple sites 11/19/2014  . Numbness and tingling of left leg 04/06/2014  . Routine general medical examination at a health care facility 09/29/2013  . Osteoporosis, post-menopausal 04/21/2013  . Cough variant asthma 03/17/2013  . Psoriasis 03/17/2013  . Persistent dry cough 03/17/2013  . Osteoarthritis of left knee 03/03/2013  . Psoriasis-like skin disease 03/03/2013  . Eczema 01/07/2013  . Need for prophylactic vaccination and inoculation against influenza 01/07/2013  . Prediabetes 12/17/2012  . Cough 12/17/2012  . Reactive airway disease with wheezing 12/03/2012  . URI, acute 12/03/2012  . Hypothyroidism 10/29/2012  . Unspecified vitamin D deficiency 10/29/2012  . Hyperlipidemia LDL goal <130 10/29/2012  . GERD (gastroesophageal reflux disease) 10/29/2012  . Osteoporosis 10/29/2012  . Myalgia and myositis 10/29/2012    Past Medical History:  Diagnosis Date  . Acute bronchiolitis due to other infectious organisms   . Acute bronchiolitis due to  respiratory syncytial virus (RSV)   . Arthritis    OA AND PAIN RIGHT KNEE  . Degeneration of intervertebral disc, site unspecified   . Disorder of bone and cartilage, unspecified   . Disturbance of skin sensation   . Encounter for long-term (current) use of other medications   . GERD (gastroesophageal reflux disease)    PT STATES SEVERE REFLUX IF SHE DOES NOT TAKE HER OMEPRAZOLE  . Headache(784.0)    WHEN REFLUX SEVERE  . Hypothyroidism   . Long term (current) use of anticoagulants   . Osteoarthrosis, unspecified whether generalized or localized, lower leg   . Osteoporosis, unspecified   . Other and unspecified hyperlipidemia   . Other atopic dermatitis and related conditions   . Other malaise and fatigue   . Other specified pre-operative examination 11/07/2011  . Thoracic or lumbosacral neuritis or radiculitis, unspecified   . Unspecified vitamin D deficiency   . Vegetarian diet     Past Surgical History:  Procedure Laterality Date  . CERVICAL BIOPSY  W/ LOOP ELECTRODE EXCISION    . EYE SURGERY  2016   Left eye, Cataract Removal  . TEETH EXTRACTIONS--NO OTHER SURGERY    . TOTAL KNEE ARTHROPLASTY  11/28/2011   Procedure: TOTAL KNEE ARTHROPLASTY;  Surgeon: Jacki Cones, MD;  Location: [facility];  Service: Orthopedics;  Laterality: Right;  . TUBAL LIGATION      Current Outpatient Prescriptions  Medication Sig Dispense  Refill  . aspirin 81 MG tablet Take 81 mg by mouth daily.    . calcium carbonate (OS-CAL) 600 MG TABS Take 600 mg by mouth 2 (two) times daily with a meal. Take one tablet twice a day for calcium supplement    . ferrous sulfate 325 (65 FE) MG tablet Take 1 tablet (325 mg total) by mouth daily with breakfast. 90 tablet 2  . gabapentin (NEURONTIN) 300 MG capsule Take 1 capsule (300 mg total) by mouth daily. 90 capsule 2  . ibuprofen (ADVIL,MOTRIN) 400 MG tablet Take 1 tablet (400 mg total) by mouth every 6 (six) hours as needed. 30 tablet 0  . levothyroxine  (SYNTHROID, LEVOTHROID) 25 MCG tablet Take 1.5 tablets (37.5 mcg total) by mouth daily before breakfast. 135 tablet 2  . levothyroxine (SYNTHROID, LEVOTHROID) 25 MCG tablet TAKE 1 & 1/2 (ONE & ONE-HALF) TABLETS BY MOUTH  DAILY BEFORE BREAKFAST 135 tablet 1  . methocarbamol (ROBAXIN) 500 MG tablet Take 1 tablet (500 mg total) by mouth 2 (two) times daily. 20 tablet 0  . NONFORMULARY OR COMPOUNDED ITEM Fluorouracil 5 % cream (5 FU) place 2 grams vaginally at bedtime once per week for 2 weeks. Patient will need vaginal applicators. Dispense 4 grams 0RF. 1 each 0  . omeprazole (PRILOSEC) 20 MG capsule TAKE ONE CAPSULE BY MOUTH ONCE DAILY TO REDUCE  STOMACH ACID 90 capsule 2  . simvastatin (ZOCOR) 20 MG tablet TAKE ONE TABLET BY MOUTH ONCE DAILY FOR CHOLESTEROL 90 tablet 2  . tiZANidine (ZANAFLEX) 4 MG tablet Take 1 tablet (4 mg total) by mouth every 8 (eight) hours as needed for muscle spasms. 20 tablet 0   No current facility-administered medications for this visit.      ALLERGIES: Other and Prednisolone  Family History  Problem Relation Age of Onset  . Alport syndrome Brother   . Breast cancer Neg Hx     Social History   Social History  . Marital status: Married    Spouse name: N/A  . Number of children: N/A  . Years of education: N/A   Occupational History  . Not on file.   Social History Main Topics  . Smoking status: Never Smoker  . Smokeless tobacco: Never Used  . Alcohol use No  . Drug use: No  . Sexual activity: Yes    Birth control/ protection: Post-menopausal   Other Topics Concern  . Not on file   Social History Narrative  . No narrative on file    Review of Systems  Constitutional: Negative.   HENT: Negative.   Eyes: Negative.   Respiratory: Negative.   Cardiovascular: Negative.   Gastrointestinal: Negative.   Genitourinary: Negative.   Musculoskeletal: Negative.   Skin: Negative.   Neurological: Negative.   Endo/Heme/Allergies: Negative.    Psychiatric/Behavioral: Negative.     PHYSICAL EXAMINATION:    BP 104/64 (BP Location: Right Arm, Patient Position: Sitting, Cuff Size: Normal)   Pulse 88   Resp 16   Wt 131 lb (59.4 kg)   BMI 28.35 kg/m     General appearance: alert, cooperative and appears stated age  Pelvic: External genitalia:  no lesions              Urethra:  normal appearing urethra with no masses, tenderness or lesions              Bartholins and Skenes: normal                 Vagina:  atrophic appearing vagina with normal color and discharge, no lesions              Cervix: flush with the vagina   Chaperone was present for exam.  ASSESSMENT H/O cervical dysplasia H/O vaginal dysplasia S/P leep and treatment with 5 FU   PLAN Pap of cervix and vagina   An After Visit Summary was printed and given to the patient.

## 2017-02-08 ENCOUNTER — Encounter (HOSPITAL_COMMUNITY): Payer: Medicare Other | Attending: Orthopedic Surgery

## 2017-02-08 ENCOUNTER — Encounter (HOSPITAL_COMMUNITY): Payer: Medicare Other

## 2017-02-08 ENCOUNTER — Encounter (HOSPITAL_COMMUNITY): Payer: Self-pay

## 2017-02-12 LAB — CYTOLOGY - PAP
DIAGNOSIS: NEGATIVE
HPV (WINDOPATH): NOT DETECTED

## 2017-08-05 DIAGNOSIS — H2511 Age-related nuclear cataract, right eye: Secondary | ICD-10-CM | POA: Diagnosis not present

## 2017-08-05 DIAGNOSIS — H25011 Cortical age-related cataract, right eye: Secondary | ICD-10-CM | POA: Diagnosis not present

## 2017-08-05 DIAGNOSIS — H524 Presbyopia: Secondary | ICD-10-CM | POA: Diagnosis not present

## 2017-08-05 DIAGNOSIS — H35342 Macular cyst, hole, or pseudohole, left eye: Secondary | ICD-10-CM | POA: Diagnosis not present

## 2017-08-08 ENCOUNTER — Ambulatory Visit (INDEPENDENT_AMBULATORY_CARE_PROVIDER_SITE_OTHER): Payer: Medicare Other | Admitting: *Deleted

## 2017-08-08 DIAGNOSIS — M810 Age-related osteoporosis without current pathological fracture: Secondary | ICD-10-CM | POA: Diagnosis not present

## 2017-08-08 MED ORDER — DENOSUMAB 60 MG/ML ~~LOC~~ SOLN
60.0000 mg | Freq: Once | SUBCUTANEOUS | Status: AC
  Administered 2017-08-08: 60 mg via SUBCUTANEOUS

## 2017-08-13 NOTE — Addendum Note (Signed)
Addended by: Maurice SmallBEATTY, Vitalia Stough C on: 08/13/2017 04:49 PM   Modules accepted: Orders

## 2017-08-14 ENCOUNTER — Other Ambulatory Visit: Payer: Medicare Other

## 2017-08-14 DIAGNOSIS — E034 Atrophy of thyroid (acquired): Secondary | ICD-10-CM | POA: Diagnosis not present

## 2017-08-14 DIAGNOSIS — D649 Anemia, unspecified: Secondary | ICD-10-CM | POA: Diagnosis not present

## 2017-08-14 DIAGNOSIS — M62838 Other muscle spasm: Secondary | ICD-10-CM | POA: Diagnosis not present

## 2017-08-14 LAB — LIPID PANEL
CHOL/HDL RATIO: 3.9 (calc) (ref <5.0)
Cholesterol: 213 mg/dL — ABNORMAL HIGH (ref <200)
HDL: 55 mg/dL (ref >50)
LDL CHOLESTEROL (CALC): 133 mg/dL — AB
NON-HDL CHOLESTEROL (CALC): 158 mg/dL — AB (ref <130)
TRIGLYCERIDES: 130 mg/dL (ref <150)

## 2017-08-14 LAB — COMPLETE METABOLIC PANEL WITH GFR
AG RATIO: 1.8 (calc) (ref 1.0–2.5)
ALKALINE PHOSPHATASE (APISO): 69 U/L (ref 33–130)
ALT: 21 U/L (ref 6–29)
AST: 21 U/L (ref 10–35)
Albumin: 4.2 g/dL (ref 3.6–5.1)
BILIRUBIN TOTAL: 0.4 mg/dL (ref 0.2–1.2)
BUN/Creatinine Ratio: 21 (calc) (ref 6–22)
BUN: 11 mg/dL (ref 7–25)
CO2: 30 mmol/L (ref 20–32)
Calcium: 9.8 mg/dL (ref 8.6–10.4)
Chloride: 102 mmol/L (ref 98–110)
Creat: 0.53 mg/dL — ABNORMAL LOW (ref 0.60–0.93)
GFR, Est African American: 108 mL/min/{1.73_m2} (ref >=60)
GFR, Est Non African American: 94 mL/min/{1.73_m2} (ref >=60)
GLOBULIN: 2.4 g/dL (ref 1.9–3.7)
Glucose, Bld: 103 mg/dL — ABNORMAL HIGH (ref 65–99)
POTASSIUM: 4.4 mmol/L (ref 3.5–5.3)
SODIUM: 138 mmol/L (ref 135–146)
Total Protein: 6.6 g/dL (ref 6.1–8.1)

## 2017-08-14 LAB — CBC WITH DIFFERENTIAL/PLATELET
Basophils Absolute: 113 cells/uL (ref 0–200)
Basophils Relative: 1.4 %
Eosinophils Absolute: 122 cells/uL (ref 15–500)
Eosinophils Relative: 1.5 %
HEMATOCRIT: 38.3 % (ref 35.0–45.0)
HEMOGLOBIN: 13 g/dL (ref 11.7–15.5)
LYMPHS ABS: 3086 {cells}/uL (ref 850–3900)
MCH: 30 pg (ref 27.0–33.0)
MCHC: 33.9 g/dL (ref 32.0–36.0)
MCV: 88.5 fL (ref 80.0–100.0)
MPV: 9.9 fL (ref 7.5–12.5)
Monocytes Relative: 9.8 %
NEUTROS PCT: 49.2 %
Neutro Abs: 3985 cells/uL (ref 1500–7800)
Platelets: 284 10*3/uL (ref 140–400)
RBC: 4.33 10*6/uL (ref 3.80–5.10)
RDW: 12.3 % (ref 11.0–15.0)
Total Lymphocyte: 38.1 %
WBC: 8.1 10*3/uL (ref 3.8–10.8)
WBCMIX: 794 {cells}/uL (ref 200–950)

## 2017-08-14 LAB — T4, FREE: Free T4: 1.3 ng/dL (ref 0.8–1.8)

## 2017-08-14 LAB — TSH: TSH: 2.96 mIU/L (ref 0.40–4.50)

## 2017-08-16 ENCOUNTER — Other Ambulatory Visit: Payer: Medicare Other

## 2017-08-27 ENCOUNTER — Encounter: Payer: Self-pay | Admitting: Internal Medicine

## 2017-08-27 ENCOUNTER — Ambulatory Visit (INDEPENDENT_AMBULATORY_CARE_PROVIDER_SITE_OTHER): Payer: Medicare Other | Admitting: Internal Medicine

## 2017-08-27 ENCOUNTER — Ambulatory Visit (INDEPENDENT_AMBULATORY_CARE_PROVIDER_SITE_OTHER): Payer: Medicare Other

## 2017-08-27 VITALS — BP 128/78 | HR 86 | Temp 97.7°F | Ht <= 58 in | Wt 127.0 lb

## 2017-08-27 VITALS — BP 128/78 | HR 86 | Ht <= 58 in | Wt 127.0 lb

## 2017-08-27 DIAGNOSIS — Z79899 Other long term (current) drug therapy: Secondary | ICD-10-CM | POA: Diagnosis not present

## 2017-08-27 DIAGNOSIS — R7303 Prediabetes: Secondary | ICD-10-CM | POA: Diagnosis not present

## 2017-08-27 DIAGNOSIS — K219 Gastro-esophageal reflux disease without esophagitis: Secondary | ICD-10-CM | POA: Diagnosis not present

## 2017-08-27 DIAGNOSIS — M62838 Other muscle spasm: Secondary | ICD-10-CM | POA: Diagnosis not present

## 2017-08-27 DIAGNOSIS — E034 Atrophy of thyroid (acquired): Secondary | ICD-10-CM | POA: Diagnosis not present

## 2017-08-27 DIAGNOSIS — R200 Anesthesia of skin: Secondary | ICD-10-CM | POA: Diagnosis not present

## 2017-08-27 DIAGNOSIS — E785 Hyperlipidemia, unspecified: Secondary | ICD-10-CM

## 2017-08-27 DIAGNOSIS — M25462 Effusion, left knee: Secondary | ICD-10-CM

## 2017-08-27 DIAGNOSIS — G250 Essential tremor: Secondary | ICD-10-CM

## 2017-08-27 DIAGNOSIS — Z0000 Encounter for general adult medical examination without abnormal findings: Secondary | ICD-10-CM

## 2017-08-27 DIAGNOSIS — M810 Age-related osteoporosis without current pathological fracture: Secondary | ICD-10-CM | POA: Diagnosis not present

## 2017-08-27 MED ORDER — IBUPROFEN 400 MG PO TABS: 400.0000 mg | ORAL_TABLET | Freq: Four times a day (QID) | ORAL | 3 refills | Status: DC | PRN

## 2017-08-27 MED ORDER — OMEPRAZOLE 20 MG PO CPDR: DELAYED_RELEASE_CAPSULE | ORAL | 3 refills | Status: DC

## 2017-08-27 MED ORDER — GABAPENTIN 300 MG PO CAPS: 300.0000 mg | ORAL_CAPSULE | Freq: Every day | ORAL | 3 refills | Status: DC

## 2017-08-27 MED ORDER — LEVOTHYROXINE SODIUM 25 MCG PO TABS: ORAL_TABLET | ORAL | 3 refills | Status: DC

## 2017-08-27 MED ORDER — SIMVASTATIN 20 MG PO TABS: ORAL_TABLET | ORAL | 3 refills | Status: DC

## 2017-08-27 MED ORDER — METHOCARBAMOL 500 MG PO TABS: 500.0000 mg | ORAL_TABLET | Freq: Two times a day (BID) | ORAL | 3 refills | Status: DC

## 2017-08-27 NOTE — Progress Notes (Signed)
Subjective:   [name] [name] is a 72 y.o. female who presents for Medicare Annual (Subsequent) preventive examination.  Last AWV- 05/09/2016    Objective:     Vitals: BP 128/78 (BP Location: Left Arm, Patient Position: Sitting)   Pulse 86   Temp 97.7 F (36.5 C) (Oral)   Ht 4\' 10"  (1.473 m)   Wt 127 lb (57.6 kg)   SpO2 98%   BMI 26.54 kg/m   Body mass index is 26.54 kg/m.  Advanced Directives 08/27/2017 02/03/2017 05/11/2016 05/09/2016 02/08/2016 11/02/2015 01/19/2015  Does Patient Have a Medical Advance Directive? No No No No No No No  Does patient want to make changes to medical advance directive? - - No - Patient declined Yes (MAU/Ambulatory/Procedural Areas - Information given) - - -  Would patient like information on creating a medical advance directive? No - Patient declined - - Yes (ED - Information included in AVS) No - patient declined information No - patient declined information Yes - Transport plannerducational materials given    Tobacco Social History   Tobacco Use  Smoking Status Never Smoker  Smokeless Tobacco Never Used     Counseling given: Not Answered   Clinical Intake:  Pre-visit preparation completed: No  Pain : No/denies pain     Nutritional Risks: None Diabetes: No  How often do you need to have someone help you when you read instructions, pamphlets, or other written materials from your doctor or pharmacy?: 1 - Never What is the last grade level you completed in school?: College  Interpreter Needed?: Yes  Information entered by :: Malcolm MetroSara Saunder, RN  Past Medical History:  Diagnosis Date  . Acute bronchiolitis due to other infectious organisms   . Acute bronchiolitis due to respiratory syncytial virus (RSV)   . Arthritis    OA AND PAIN RIGHT KNEE  . Degeneration of intervertebral disc, site unspecified   . Disorder of bone and cartilage, unspecified   . Disturbance of skin sensation   . Encounter for long-term (current) use of other medications   . GERD  (gastroesophageal reflux disease)    PT STATES SEVERE REFLUX IF SHE DOES NOT TAKE HER OMEPRAZOLE  . Headache(784.0)    WHEN REFLUX SEVERE  . Hypothyroidism   . Long term (current) use of anticoagulants   . Osteoarthrosis, unspecified whether generalized or localized, lower leg   . Osteoporosis, unspecified   . Other and unspecified hyperlipidemia   . Other atopic dermatitis and related conditions   . Other malaise and fatigue   . Other specified pre-operative examination 11/07/2011  . Thoracic or lumbosacral neuritis or radiculitis, unspecified   . Unspecified vitamin D deficiency   . Vegetarian diet    Past Surgical History:  Procedure Laterality Date  . CERVICAL BIOPSY  W/ LOOP ELECTRODE EXCISION    . EYE SURGERY  2016   Left eye, Cataract Removal  . TEETH EXTRACTIONS--NO OTHER SURGERY    . TOTAL KNEE ARTHROPLASTY  11/28/2011   Procedure: TOTAL KNEE ARTHROPLASTY;  Surgeon: Jacki Conesonald A Gioffre, MD;  Location: [facility];  Service: Orthopedics;  Laterality: Right;  . TUBAL LIGATION     Family History  Problem Relation Age of Onset  . Alport syndrome Brother   . Breast cancer Neg Hx    Social History   Socioeconomic History  . Marital status: Married    Spouse name: Not on file  . Number of children: Not on file  . Years of education: Not on file  .  Highest education level: Not on file  Occupational History  . Not on file  Social Needs  . Financial resource strain: Not hard at all  . Food insecurity:    Worry: Never true    Inability: Never true  . Transportation needs:    Medical: No    Non-medical: No  Tobacco Use  . Smoking status: Never Smoker  . Smokeless tobacco: Never Used  Substance and Sexual Activity  . Alcohol use: No  . Drug use: No  . Sexual activity: Yes    Birth control/protection: Post-menopausal  Lifestyle  . Physical activity:    Days per week: 3 days    Minutes per session: 30 min  . Stress: Not at all  Relationships  . Social connections:     Talks on phone: More than three times a week    Gets together: More than three times a week    Attends religious service: Never    Active member of club or organization: No    Attends meetings of clubs or organizations: Never    Relationship status: Married  Other Topics Concern  . Not on file  Social History Narrative  . Not on file    Outpatient Encounter Medications as of 08/27/2017  Medication Sig  . aspirin 81 MG tablet Take 81 mg by mouth daily.  . calcium carbonate (OS-CAL) 600 MG TABS Take 600 mg by mouth 2 (two) times daily with a meal. Take one tablet twice a day for calcium supplement  . ferrous sulfate 325 (65 FE) MG tablet Take 1 tablet (325 mg total) by mouth daily with breakfast.  . gabapentin (NEURONTIN) 300 MG capsule Take 1 capsule (300 mg total) by mouth daily.  Marland Kitchen ibuprofen (ADVIL,MOTRIN) 400 MG tablet Take 1 tablet (400 mg total) by mouth every 6 (six) hours as needed.  Marland Kitchen levothyroxine (SYNTHROID, LEVOTHROID) 25 MCG tablet Take 1.5 tablets (37.5 mcg total) by mouth daily before breakfast.  . levothyroxine (SYNTHROID, LEVOTHROID) 25 MCG tablet TAKE 1 & 1/2 (ONE & ONE-HALF) TABLETS BY MOUTH  DAILY BEFORE BREAKFAST  . methocarbamol (ROBAXIN) 500 MG tablet Take 1 tablet (500 mg total) by mouth 2 (two) times daily.  . NONFORMULARY OR COMPOUNDED ITEM Fluorouracil 5 % cream (5 FU) place 2 grams vaginally at bedtime once per week for 2 weeks. Patient will need vaginal applicators. Dispense 4 grams 0RF.  Marland Kitchen omeprazole (PRILOSEC) 20 MG capsule TAKE ONE CAPSULE BY MOUTH ONCE DAILY TO REDUCE  STOMACH ACID  . simvastatin (ZOCOR) 20 MG tablet TAKE ONE TABLET BY MOUTH ONCE DAILY FOR CHOLESTEROL  . tiZANidine (ZANAFLEX) 4 MG tablet Take 1 tablet (4 mg total) by mouth every 8 (eight) hours as needed for muscle spasms.   No facility-administered encounter medications on file as of 08/27/2017.     Activities of Daily Living In your present state of health, do you have any difficulty  performing the following activities: 08/27/2017  Hearing? N  Vision? N  Difficulty concentrating or making decisions? N  Walking or climbing stairs? N  Dressing or bathing? N  Doing errands, shopping? N  Preparing Food and eating ? N  Using the Toilet? N  In the past six months, have you accidently leaked urine? N  Do you have problems with loss of bowel control? N  Managing your Medications? N  Managing your Finances? N  Housekeeping or managing your Housekeeping? N  Some recent data might be hidden    Patient Care Team: Kirt Boys,  DO as PCP - General (Internal Medicine)    Assessment:   This is a routine wellness examination for [name] [name].  Exercise Activities and Dietary recommendations Current Exercise Habits: Home exercise routine, Type of exercise: walking, Time (Minutes): 30, Frequency (Times/Week): 3, Weekly Exercise (Minutes/Week): 90, Intensity: Mild, Exercise limited by: None identified  Goals    None      Fall Risk Fall Risk  08/27/2017 12/14/2016 07/23/2016 05/11/2016 05/09/2016  Falls in the past year? No No No No No   Is the patient's home free of loose throw rugs in walkways, pet beds, electrical cords, etc?   yes      Grab bars in the bathroom? yes      Handrails on the stairs?   yes      Adequate lighting?   yes  Depression Screen PHQ 2/9 Scores 08/27/2017 07/23/2016 05/09/2016 11/02/2015  PHQ - 2 Score 0 0 0 0     Cognitive Function MMSE - Mini Mental State Exam 08/27/2017 05/09/2016 11/19/2014  Orientation to time 4 5 4   Orientation to Place 5 5 5   Registration 3 3 3   Attention/ Calculation 5 5 5   Recall 3 3 3   Language- name 2 objects 2 2 2   Language- repeat 1 1 1   Language- follow 3 step command 3 3 3   Language- read & follow direction 1 1 1   Write a sentence 1 1 1   Copy design 1 1 1   Total score 29 30 29         Immunization History  Administered Date(s) Administered  . Influenza,inj,Quad PF,6+ Mos 01/07/2013, 02/09/2014, 01/19/2015,  02/24/2016  . Influenza-Unspecified 02/20/2012  . Pneumococcal Conjugate-13 09/29/2013  . Pneumococcal Polysaccharide-23 06/28/2010  . Tdap 03/28/2011    Qualifies for Shingles Vaccine? Yes, educated and declined  Screening Tests Health Maintenance  Topic Date Due  . COLONOSCOPY  07/05/1993  . Hepatitis C Screening  05/07/2018 (Originally [birth year])  . INFLUENZA VACCINE  12/05/2017  . MAMMOGRAM  12/17/2017  . TETANUS/TDAP  03/27/2021  . DEXA SCAN  Completed  . PNA vac Low Risk Adult  Completed    Cancer Screenings: Lung: Low Dose CT Chest recommended if Age 43-80 years, 30 pack-year currently smoking OR have quit w/in 15years. Patient does not qualify. Breast:  Up to date on Mammogram? Yes   Up to date of Bone Density/Dexa? Yes Colorectal: stated she had cologuard done last year and will fax [place] the records  Additional Screenings:  Hepatitis C Screening: declined     Plan:  I have personally reviewed and addressed the Medicare Annual Wellness questionnaire and have noted the following in the patient's chart:  A. Medical and social history B. Use of alcohol, tobacco or illicit drugs  C. Current medications and supplements D. Functional ability and status E.  Nutritional status F.  Physical activity G. Advance directives H. List of other physicians I.  Hospitalizations, surgeries, and ER visits in previous 12 months J.  Vitals K. Screenings to include hearing, vision, cognitive, depression L. Referrals and appointments - none  In addition, I have reviewed and discussed with patient certain preventive protocols, quality metrics, and best practice recommendations. A written personalized care plan for preventive services as well as general preventive health recommendations were provided to patient.  See attached scanned questionnaire for additional information.   Signed,   Tyron Russell, RN Nurse Health Advisor  Patient concerns: L hand tremor and will drop things  sometimes

## 2017-08-27 NOTE — Patient Instructions (Addendum)
Continue current medications as ordered  Follow up in 6 mos for thyroid, hyperlipidemia, osteoporosis, GERD. Fasting labs prior to appt  Keeping You Healthy  Get These Tests  Blood Pressure- Have your blood pressure checked by your healthcare provider at least once a year.  Normal blood pressure is 120/80.  Weight- Have your body mass index (BMI) calculated to screen for obesity.  BMI is a measure of body fat based on height and weight.  You can calculate your own BMI at [email or web address]://[email or web address]-[email or web address]  Cholesterol- Have your cholesterol checked every year.  Diabetes- Have your blood sugar checked every year if you have high blood pressure, high cholesterol, a family history of diabetes or if you are overweight.  Pap Test - Have a pap test every 1 to 5 years if you have been sexually active.  If you are older than 65 and recent pap tests have been normal you may not need additional pap tests.  In addition, if you have had a hysterectomy  for benign disease additional pap tests are not necessary.  Mammogram-Yearly mammograms are essential for early detection of breast cancer  Screening for Colon Cancer- Colonoscopy starting at age 74. Screening may begin sooner depending on your family history and other health conditions.  Follow up colonoscopy as directed by your Gastroenterologist.  Screening for Osteoporosis- Screening begins at age 74 with bone density scanning, sooner if you are at higher risk for developing Osteoporosis.  Get these medicines  Calcium with Vitamin D- Your body requires 1200-1500 mg of Calcium a day and [phone] IU of Vitamin D a day.  You can only absorb 500 mg of Calcium at a time therefore Calcium must be taken in 2 or 3 separate doses throughout the day.  Hormones- Hormone therapy has been associated with increased risk for certain cancers and heart disease.  Talk to your healthcare provider about if you need relief from menopausal symptoms.  Aspirin- Ask your  healthcare provider about taking Aspirin to prevent Heart Disease and Stroke.  Get these Immuniztions  Flu shot- Every fall  Pneumonia shot- Once after the age of 74; if you are younger ask your healthcare provider if you need a pneumonia shot.  Tetanus- Every ten years.  Zostavax- Once after the age of 74 to prevent shingles.  Take these steps  Don't smoke- Your healthcare provider can help you quit. For tips on how to quit, ask your healthcare provider or go to www.smokefree.gov or call 1-800 QUIT-NOW.  Be physically active- Exercise 5 days a week for a minimum of 30 minutes.  If you are not already physically active, start slow and gradually work up to 30 minutes of moderate physical activity.  Try walking, dancing, bike riding, swimming, etc.  Eat a healthy diet- Eat a variety of healthy foods such as fruits, vegetables, whole grains, low fat milk, low fat cheeses, yogurt, lean meats, chicken, fish, eggs, dried beans, tofu, etc.  For more information go to www.thenutritionsource.org  Dental visit- Brush and floss teeth twice daily; visit your dentist twice a year.  Eye exam- Visit your Optometrist or Ophthalmologist yearly.  Drink alcohol in moderation- Limit alcohol intake to one drink or less a day.  Never drink and drive.  Depression- Your emotional health is as important as your physical health.  If you're feeling down or losing interest in things you normally enjoy, please talk to your healthcare provider.  Seat Belts- can save your life; always wear one  Smoke/Carbon Monoxide detectors- These detectors  need to be installed on the appropriate level of your home.  Replace batteries at least once a year.  Violence- If anyone is threatening or hurting you, please tell your healthcare provider.  Living Will/ Health care power of attorney- Discuss with your healthcare provider and family.

## 2017-08-27 NOTE — Patient Instructions (Signed)
[name] , Thank you for taking time to come for your Medicare Wellness Visit. I appreciate your ongoing commitment to your health goals. Please review the following plan we discussed and let me know if I can assist you in the future.   Screening recommendations/referrals: Colonoscopy - please fax records of last cologuard Mammogram up to date, due 12/17/2017 Bone Density up to date Recommended yearly ophthalmology/optometry visit for glaucoma screening and checkup Recommended yearly dental visit for hygiene and checkup  Vaccinations: Influenza vaccine due 2019 fall season Pneumococcal vaccine up to date, completed Tdap vaccine up to date, due 03/27/2021 Shingles vaccine due, declined    Advanced directives: Please bring us a copy of your living will and health care power of attorney  Conditions/risks identified: none  Next appointment: [name] RussellSara Saahir Prude, RN 09/03/2018 @ 10am             [name]- 09/03/2018 @ 9:15am   Preventive Care 65 Years and Older, Female Preventive care refers to lifestyle choices and visits with your health care provider that can promote health and wellness. What does preventive care include?  A yearly physical exam. This is also called an annual well check.  Dental exams once or twice a year.  Routine eye exams. Ask your health care provider how often you should have your eyes checked.  Personal lifestyle choices, including:  Daily care of your teeth and gums.  Regular physical activity.  Eating a healthy diet.  Avoiding tobacco and drug use.  Limiting alcohol use.  Practicing safe sex.  Taking low-dose aspirin every day.  Taking vitamin and mineral supplements as recommended by your health care provider. What happens during an annual well check? The services and screenings done by your health care provider during your annual well check will depend on your age, overall health, lifestyle risk factors, and family history of disease. Counseling    Your health care provider may ask you questions about your:  Alcohol use.  Tobacco use.  Drug use.  Emotional well-being.  Home and relationship well-being.  Sexual activity.  Eating habits.  History of falls.  Memory and ability to understand (cognition).  Work and work Astronomerenvironment.  Reproductive health. Screening  You may have the following tests or measurements:  Height, weight, and BMI.  Blood pressure.  Lipid and cholesterol levels. These may be checked every 5 years, or more frequently if you are over 74 years old.  Skin check.  Lung cancer screening. You may have this screening every year starting at age 74 if you have a 30-pack-year history of smoking and currently smoke or have quit within the past 15 years.  Fecal occult blood test (FOBT) of the stool. You may have this test every year starting at age 74.  Flexible sigmoidoscopy or colonoscopy. You may have a sigmoidoscopy every 5 years or a colonoscopy every 10 years starting at age 74.  Hepatitis C blood test.  Hepatitis B blood test.  Sexually transmitted disease (STD) testing.  Diabetes screening. This is done by checking your blood sugar (glucose) after you have not eaten for a while (fasting). You may have this done every 1-3 years.  Bone density scan. This is done to screen for osteoporosis. You may have this done starting at age 74.  Mammogram. This may be done every 1-2 years. Talk to your health care provider about how often you should have regular mammograms. Talk with your health care provider about your test results, treatment options, and if necessary,  the need for more tests. Vaccines  Your health care provider may recommend certain vaccines, such as:  Influenza vaccine. This is recommended every year.  Tetanus, diphtheria, and acellular pertussis (Tdap, Td) vaccine. You may need a Td booster every 10 years.  Zoster vaccine. You may need this after age 55.  Pneumococcal 13-valent  conjugate (PCV13) vaccine. One dose is recommended after age 74.  Pneumococcal polysaccharide (PPSV23) vaccine. One dose is recommended after age 74. Talk to your health care provider about which screenings and vaccines you need and how often you need them. This information is not intended to replace advice given to you by your health care provider. Make sure you discuss any questions you have with your health care provider. Document Released: 05/20/2015 Document Revised: 01/11/2016 Document Reviewed: 02/22/2015 Elsevier Interactive Patient Education  2017 [place] Prevention in the Home Falls can cause injuries. They can happen to people of all ages. There are many things you can do to make your home safe and to help prevent falls. What can I do on the outside of my home?  Regularly fix the edges of walkways and driveways and fix any cracks.  Remove anything that might make you trip as you walk through a door, such as a raised step or threshold.  Trim any bushes or trees on the path to your home.  Use bright outdoor lighting.  Clear any walking paths of anything that might make someone trip, such as rocks or tools.  Regularly check to see if handrails are loose or broken. Make sure that both sides of any steps have handrails.  Any raised decks and porches should have guardrails on the edges.  Have any leaves, snow, or ice cleared regularly.  Use sand or salt on walking paths during winter.  Clean up any spills in your garage right away. This includes oil or grease spills. What can I do in the bathroom?  Use night lights.  Install grab bars by the toilet and in the tub and shower. Do not use towel bars as grab bars.  Use non-skid mats or decals in the tub or shower.  If you need to sit down in the shower, use a plastic, non-slip stool.  Keep the floor dry. Clean up any water that spills on the floor as soon as it happens.  Remove soap buildup in the tub or  shower regularly.  Attach bath mats securely with double-sided non-slip rug tape.  Do not have throw rugs and other things on the floor that can make you trip. What can I do in the bedroom?  Use night lights.  Make sure that you have a light by your bed that is easy to reach.  Do not use any sheets or blankets that are too big for your bed. They should not hang down onto the floor.  Have a firm chair that has side arms. You can use this for support while you get dressed.  Do not have throw rugs and other things on the floor that can make you trip. What can I do in the kitchen?  Clean up any spills right away.  Avoid walking on wet floors.  Keep items that you use a lot in easy-to-reach places.  If you need to reach something above you, use a strong step stool that has a grab bar.  Keep electrical cords out of the way.  Do not use floor polish or wax that makes floors slippery. If you must use  wax, use non-skid floor wax.  Do not have throw rugs and other things on the floor that can make you trip. What can I do with my stairs?  Do not leave any items on the stairs.  Make sure that there are handrails on both sides of the stairs and use them. Fix handrails that are broken or loose. Make sure that handrails are as long as the stairways.  Check any carpeting to make sure that it is firmly attached to the stairs. Fix any carpet that is loose or worn.  Avoid having throw rugs at the top or bottom of the stairs. If you do have throw rugs, attach them to the floor with carpet tape.  Make sure that you have a light switch at the top of the stairs and the bottom of the stairs. If you do not have them, ask someone to add them for you. What else can I do to help prevent falls?  Wear shoes that:  Do not have high heels.  Have rubber bottoms.  Are comfortable and fit you well.  Are closed at the toe. Do not wear sandals.  If you use a stepladder:  Make sure that it is fully  opened. Do not climb a closed stepladder.  Make sure that both sides of the stepladder are locked into place.  Ask someone to hold it for you, if possible.  Clearly mark and make sure that you can see:  Any grab bars or handrails.  First and last steps.  Where the edge of each step is.  Use tools that help you move around (mobility aids) if they are needed. These include:  Canes.  Walkers.  Scooters.  Crutches.  Turn on the lights when you go into a dark area. Replace any light bulbs as soon as they burn out.  Set up your furniture so you have a clear path. Avoid moving your furniture around.  If any of your floors are uneven, fix them.  If there are any pets around you, be aware of where they are.  Review your medicines with your doctor. Some medicines can make you feel dizzy. This can increase your chance of falling. Ask your doctor what other things that you can do to help prevent falls. This information is not intended to replace advice given to you by your health care provider. Make sure you discuss any questions you have with your health care provider. Document Released: 02/17/2009 Document Revised: 09/29/2015 Document Reviewed: 05/28/2014 Elsevier Interactive Patient Education  2017 [employer].
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
## 2017-08-29 ENCOUNTER — Other Ambulatory Visit: Payer: Medicare Other

## 2017-08-30 ENCOUNTER — Encounter: Payer: Self-pay | Admitting: Internal Medicine

## 2017-09-11 ENCOUNTER — Ambulatory Visit: Payer: Self-pay | Admitting: Internal Medicine

## 2017-11-11 ENCOUNTER — Other Ambulatory Visit: Payer: Self-pay | Admitting: Internal Medicine

## 2017-11-11 DIAGNOSIS — Z1231 Encounter for screening mammogram for malignant neoplasm of breast: Secondary | ICD-10-CM

## 2017-12-19 ENCOUNTER — Ambulatory Visit: Payer: Medicare Other

## 2017-12-25 ENCOUNTER — Encounter: Payer: Self-pay | Admitting: Internal Medicine

## 2018-01-23 ENCOUNTER — Ambulatory Visit (INDEPENDENT_AMBULATORY_CARE_PROVIDER_SITE_OTHER): Payer: Medicare Other | Admitting: Family

## 2018-01-23 ENCOUNTER — Encounter: Payer: Self-pay | Admitting: Family

## 2018-01-23 VITALS — BP 142/90 | HR 81 | Temp 97.5°F | Ht <= 58 in | Wt 130.0 lb

## 2018-01-23 DIAGNOSIS — L409 Psoriasis, unspecified: Secondary | ICD-10-CM

## 2018-01-23 DIAGNOSIS — L600 Ingrowing nail: Secondary | ICD-10-CM | POA: Diagnosis not present

## 2018-01-23 MED ORDER — BETAMETHASONE DIPROPIONATE 0.05 % EX CREA: TOPICAL_CREAM | Freq: Two times a day (BID) | CUTANEOUS | 0 refills | Status: DC

## 2018-01-23 NOTE — Progress Notes (Signed)
Provider: Dinah Ngetich FNP-C  Kirt Boysarter, Monica, DO  Patient Care Team: Kirt Boysarter, Monica, DO as PCP - General (Internal Medicine)  Extended Emergency Contact Information Primary Emergency Contact: [facility]Manubhai Address: [address]          [place], [place] [address] [place] Phone: [phone] Mobile Phone: [phone] Relation: Spouse Secondary Emergency Contact: Hrivnak,Raj Address: [address]          [place], [place] [address] [place] Home Phone: [phone] Mobile Phone: [phone] Relation: Son   Goals of care: Advanced Directive information Advanced Directives 08/27/2017  Does Patient Have a Medical Advance Directive? No  Does patient want to make changes to medical advance directive? -  Would patient like information on creating a medical advance directive? No - Patient declined     Chief Complaint  Patient presents with  . Acute Visit    Rash hands x 1 month and big toe pain left foot x 1 month (pain is worse when touched)     HPI:  Pt is a 72 [facility]. [facility] for evaluation of rash on hands and great toe pain.she is here today with husband and her daughter. She states has rash on the hands on both hands that has worsen in the past one month.Rash has been itchy sometimes.she washes dishes.Patient's daughter states mother had similar rash last year during the winter.she denies any fever,chills or drainage from rash. Left Great toenail side has been painful to touch for one month.No drainage.  Her B/p elevated this visit though states was worried coming to the doctor today.Husband states checks the blood pressure at home and has been within normal range.she denies any headache,dizziness,N/V or visual disturbance.will notify provider if B/p is high at home.    Past Medical History:  Diagnosis Date  . Acute bronchiolitis due to other infectious organisms   . Acute bronchiolitis due to  respiratory syncytial virus (RSV)   . Arthritis    OA AND PAIN RIGHT KNEE  . Degeneration of intervertebral disc, site unspecified   . Disorder of bone and cartilage, unspecified   . Disturbance of skin sensation   . Encounter for long-term (current) use of other medications   . GERD (gastroesophageal reflux disease)    PT STATES SEVERE REFLUX IF SHE DOES NOT TAKE HER OMEPRAZOLE  . Headache(784.0)    WHEN REFLUX SEVERE  . Hypothyroidism   . Long term (current) use of anticoagulants   . Osteoarthrosis, unspecified whether generalized or localized, lower leg   . Osteoporosis, unspecified   . Other and unspecified hyperlipidemia   . Other atopic dermatitis and related conditions   . Other malaise and fatigue   . Other specified pre-operative examination 11/07/2011  . Thoracic or lumbosacral neuritis or radiculitis, unspecified   . Unspecified vitamin D deficiency   . Vegetarian diet    Past Surgical History:  Procedure Laterality Date  . CERVICAL BIOPSY  W/ LOOP ELECTRODE EXCISION    . EYE SURGERY  2016   Left eye, Cataract Removal  . TEETH EXTRACTIONS--NO OTHER SURGERY    . TOTAL KNEE ARTHROPLASTY  11/28/2011   Procedure: TOTAL KNEE ARTHROPLASTY;  Surgeon: Jacki Conesonald A Gioffre, MD;  Location: [facility];  Service: Orthopedics;  Laterality: Right;  . TUBAL LIGATION      Allergies  Allergen Reactions  . Other Itching and Swelling    Patient had an allergy to an eye drop, she  does not know the name of the drop.  . Prednisolone Swelling    Outpatient Encounter Medications as of 01/23/2018  Medication Sig  . aspirin 81 MG tablet Take 81 mg by mouth daily.  . calcium carbonate (OS-CAL) 600 MG TABS Take 600 mg by mouth 2 (two) times daily with a meal. Take one tablet twice a day for calcium supplement  . ferrous sulfate 325 (65 FE) MG tablet Take 1 tablet (325 mg total) by mouth daily with breakfast.  . levothyroxine (SYNTHROID, LEVOTHROID) 25 MCG tablet TAKE 1 & 1/2 (ONE & ONE-HALF)  TABLETS BY MOUTH  DAILY BEFORE BREAKFAST  . omeprazole (PRILOSEC) 20 MG capsule TAKE ONE CAPSULE BY MOUTH ONCE DAILY TO REDUCE  STOMACH ACID  . simvastatin (ZOCOR) 20 MG tablet TAKE ONE TABLET BY MOUTH ONCE DAILY FOR CHOLESTEROL  . betamethasone dipropionate (DIPROLENE) 0.05 % cream Apply topically 2 (two) times daily for 14 days. Apply to affected areas on finger web  . [DISCONTINUED] gabapentin (NEURONTIN) 300 MG capsule Take 1 capsule (300 mg total) by mouth daily. (Patient not taking: Reported on 01/23/2018)  . [DISCONTINUED] ibuprofen (ADVIL,MOTRIN) 400 MG tablet Take 1 tablet (400 mg total) by mouth every 6 (six) hours as needed.  . [DISCONTINUED] methocarbamol (ROBAXIN) 500 MG tablet Take 1 tablet (500 mg total) by mouth 2 (two) times daily. For muscle spasms  . [DISCONTINUED] NONFORMULARY OR COMPOUNDED ITEM Fluorouracil 5 % cream (5 FU) place 2 grams vaginally at bedtime once per week for 2 weeks. Patient will need vaginal applicators. Dispense 4 grams 0RF.   No facility-administered encounter medications on file as of 01/23/2018.     Review of Systems  Constitutional: Negative for appetite change, chills, fatigue and fever.  HENT: Negative for congestion, rhinorrhea, sinus pressure, sinus pain, sneezing, sore throat and trouble swallowing.   Eyes: Negative for discharge, redness and itching.  Respiratory: Negative for cough, chest tightness, shortness of breath and wheezing.   Cardiovascular: Negative for chest pain, palpitations and leg swelling.  Gastrointestinal: Negative for abdominal distention, abdominal pain, constipation, diarrhea, nausea and vomiting.  Musculoskeletal: Negative for arthralgias and gait problem.  Skin: Negative for color change, pallor and wound.       Rash on both hands   Neurological: Negative for dizziness, light-headedness and headaches.  Psychiatric/Behavioral: Negative for agitation and sleep disturbance. The patient is not nervous/anxious.      Immunization History  Administered Date(s) Administered  . Influenza,inj,Quad PF,6+ Mos 01/07/2013, 02/09/2014, 01/19/2015, 02/24/2016  . Influenza-Unspecified 02/20/2012  . Pneumococcal Conjugate-13 09/29/2013  . Pneumococcal Polysaccharide-23 06/28/2010  . Tdap 03/28/2011   Pertinent  Health Maintenance Due  Topic Date Due  . COLONOSCOPY  07/05/1993  . INFLUENZA VACCINE  12/05/2017  . MAMMOGRAM  12/17/2017  . DEXA SCAN  Completed  . PNA vac Low Risk Adult  Completed   Fall Risk  01/23/2018 08/27/2017 12/14/2016 07/23/2016 05/11/2016  Falls in the past year? No No No No No    Vitals:   01/23/18 1409  BP: (!) 142/90  Pulse: 81  Temp: (!) 97.5 F (36.4 C)  TempSrc: Oral  SpO2: 97%  Weight: 130 lb (59 kg)  Height: 4\' 10"  (1.473 m)   Body mass index is 27.17 kg/m. Physical Exam  Constitutional: She is oriented to person, place, and time. She appears well-developed and well-nourished. No distress.  HENT:  Head: Normocephalic.  Right Ear: External ear normal.  Left Ear: External ear normal.  Mouth/Throat: Oropharynx is clear and moist. No  oropharyngeal exudate.  Eyes: Pupils are equal, round, and reactive to light. Conjunctivae and EOM are normal. Right eye exhibits no discharge. Left eye exhibits no discharge. No scleral icterus.  Neck: Normal range of motion. No JVD present.  Cardiovascular: Normal rate, regular rhythm, normal heart sounds and intact distal pulses. Exam reveals no gallop and no friction rub.  No murmur heard. Pulmonary/Chest: Effort normal and breath sounds normal. No respiratory distress. She has no wheezes. She has no rales.  Abdominal: Soft. Bowel sounds are normal. She exhibits no distension and no mass. There is no tenderness. There is no rebound and no guarding.  Musculoskeletal: Normal range of motion. She exhibits no edema or tenderness.  Lymphadenopathy:    She has no cervical adenopathy.  Neurological: She is oriented to person, place, and  time. No cranial nerve deficit.  Skin: Skin is warm and dry. No erythema. No pallor.  1. Left index,middle and ring finger web and right middle and ring finger dry,rough texture plaque with red base.  2. Left great toenail ingrown tender to touch.no redness or drainage noted.   Psychiatric: She has a normal mood and affect. Her behavior is normal. Thought content normal.  Vitals reviewed.   Labs reviewed: Recent Labs    02/03/17 1741 08/14/17 0849  NA 139 138  K 3.7 4.4  CL 101 102  CO2  --  30  GLUCOSE 126* 103*  BUN 8 11  CREATININE 0.50 0.53*  CALCIUM  --  9.8   Recent Labs    08/14/17 0849  AST 21  ALT 21  BILITOT 0.4  PROT 6.6   Recent Labs    02/03/17 1727 02/03/17 1741 08/14/17 0849  WBC 10.9*  --  8.1  NEUTROABS  --   --  3,985  HGB 14.2 14.6 13.0  HCT 41.2 43.0 38.3  MCV 90.7  --  88.5  PLT 295  --  284   Lab Results  Component Value Date   TSH 2.96 08/14/2017   Lab Results  Component Value Date   HGBA1C 5.6 12/11/2016   Lab Results  Component Value Date   CHOL 213 (H) 08/14/2017   HDL 55 08/14/2017   LDLCALC 133 (H) 08/14/2017   TRIG 130 08/14/2017   CHOLHDL 3.9 08/14/2017    Significant Diagnostic Results in last 30 days:  No results found.  Assessment/Plan 1. Ingrown nail of great toe of left foot  Left great toenail ingrown tender to touch.no redness or drainage noted.  - Ambulatory referral to Podiatry  2. Psoriasis (a type of skin inflammation) Afebrile.Left index,middle and ring finger web and right middle and ring finger dry,rough texture plaque with red base.symptoms consistent with psoriasis has had similar symptoms in the past.Betamethason 0.05% cream apply to affected areas on hands twice daily x 14 days.Notify provider's office if rash worsen.Has upcoming appointment on 02/05/2018.Re-evaluate rash.   Family/ staff Communication: Reviewed plan of care with patient,Husband and patient's daughter.  Labs/tests ordered: None    Dinah C Ngetich, NP

## 2018-01-23 NOTE — Patient Instructions (Addendum)
1. Betamethason 0.005% cream apply to affected areas on fingers twice daily  2. Follow up on 02/05/2018 appointment as scheduled.    Psoriasis Psoriasis is a long-term (chronic) skin condition. It causes raised, red patches (plaques) on your skin that look silvery. The red patches may show up anywhere on your body. They can be any size or shape. Psoriasis can come and go. It can range from mild to very bad. It cannot be passed from one person to another (not contagious). There is no cure for this condition, but it can be helped with treatment. Follow these instructions at home: Skin Care  Apply moisturizers to your skin as needed. Only use those that your doctor has said are okay.  Apply cool compresses to the affected areas.  Do not scratch your skin. Lifestyle   Do not use tobacco products. This includes cigarettes, chewing tobacco, and e-cigarettes. If you need help quitting, ask your doctor.  Drink little or no alcohol.  Try to lower your stress. Meditation or yoga may help.  Get sun as told by your doctor. Do not get sunburned.  Think about joining a psoriasis support group. Medicines  Take or use over-the-counter and prescription medicines only as told by your doctor.  If you were prescribed an antibiotic, take or use it as told by your doctor. Do not stop taking the antibiotic even if your condition starts to get better. General instructions  Keep a journal. Use this to help track what triggers an outbreak. Try to avoid any triggers.  See a counselor or social worker if you feel very sad, upset, or hopeless about your condition and these feelings affect your work or relationships.  Keep all follow-up visits as told by your doctor. This is important. Contact a doctor if:  Your pain gets worse.  You have more redness or warmth in the affected areas.  You have new pain or stiffness in your joints.  Your pain or stiffness in your joints gets worse.  Your nails start  to break easily.  Your nails pull away from the nail bed easily.  You have a fever.  You feel very sad (depressed). This information is not intended to replace advice given to you by your health care provider. Make sure you discuss any questions you have with your health care provider. Document Released: 05/31/2004 Document Revised: 09/29/2015 Document Reviewed: 09/08/2014 Elsevier Interactive Patient Education  2018 [employer] Ingrown Toenail An ingrown toenail occurs when the corner or sides of your toenail grow into the surrounding skin. The big toe is most commonly affected, but it can happen to any of your toes. If your ingrown toenail is not treated, you will be at risk for infection. What are the causes? This condition may be caused by:  Wearing shoes that are too small or tight.  Injury or trauma, such as stubbing your toe or having your toe stepped on.  Improper cutting or care of your toenails.  Being born with (congenital) nail or foot abnormalities, such as having a nail that is too big for your toe.  What increases the risk? Risk factors for an ingrown toenail include:  Age. Your nails tend to thicken as you get older, so ingrown nails are more common in older people.  Diabetes.  Cutting your toenails incorrectly.  Blood circulation problems.  What are the signs or symptoms? Symptoms may include:  Pain, soreness, or tenderness.  Redness.  Swelling.  Hardening of the skin surrounding the toe.  Your ingrown toenail may be infected if there is fluid, pus, or drainage. How is this diagnosed? An ingrown toenail may be diagnosed by medical history and physical exam. If your toenail is infected, your health care provider may test a sample of the drainage. How is this treated? Treatment depends on the severity of your ingrown toenail. Some ingrown toenails may be treated at home. More severe or infected ingrown toenails may require surgery to remove all or  part of the nail. Infected ingrown toenails may also be treated with antibiotic medicines. Follow these instructions at home:  If you were prescribed an antibiotic medicine, finish all of it even if you start to feel better.  Soak your foot in warm soapy water for 20 minutes, 3 times per day or as directed by your health care provider.  Carefully lift the edge of the nail away from the sore skin by wedging a small piece of cotton under the corner of the nail. This may help with the pain. Be careful not to cause more injury to the area.  Wear shoes that fit well. If your ingrown toenail is causing you pain, try wearing sandals, if possible.  Trim your toenails regularly and carefully. Do not cut them in a curved shape. Cut your toenails straight across. This prevents injury to the skin at the corners of the toenail.  Keep your feet clean and dry.  If you are having trouble walking and are given crutches by your health care provider, use them as directed.  Do not pick at your toenail or try to remove it yourself.  Take medicines only as directed by your health care provider.  Keep all follow-up visits as directed by your health care provider. This is important. Contact a health care provider if:  Your symptoms do not improve with treatment. Get help right away if:  You have red streaks that start at your foot and go up your leg.  You have a fever.  You have increased redness, swelling, or pain.  You have fluid, blood, or pus coming from your toenail. This information is not intended to replace advice given to you by your health care provider. Make sure you discuss any questions you have with your health care provider. Document Released: 04/20/2000 Document Revised: 09/23/2015 Document Reviewed: 03/17/2014 Elsevier Interactive Patient Education  [employer].

## 2018-01-30 ENCOUNTER — Ambulatory Visit: Payer: Medicare Other | Admitting: Podiatry

## 2018-02-05 ENCOUNTER — Encounter: Payer: Self-pay | Admitting: Nurse Practitioner

## 2018-02-05 ENCOUNTER — Ambulatory Visit (INDEPENDENT_AMBULATORY_CARE_PROVIDER_SITE_OTHER): Payer: Medicare Other | Admitting: Nurse Practitioner

## 2018-02-05 VITALS — BP 136/78 | HR 73 | Temp 98.0°F | Ht <= 58 in | Wt 128.0 lb

## 2018-02-05 DIAGNOSIS — L409 Psoriasis, unspecified: Secondary | ICD-10-CM

## 2018-02-05 DIAGNOSIS — L600 Ingrowing nail: Secondary | ICD-10-CM

## 2018-02-05 MED ORDER — BETAMETHASONE DIPROPIONATE 0.05 % EX CREA: TOPICAL_CREAM | Freq: Two times a day (BID) | CUTANEOUS | 0 refills | Status: AC

## 2018-02-05 NOTE — Patient Instructions (Signed)
Refill provided on cream for rash- to notify if symptoms do not improve or worsen may need dermatology referral  To soak left foot in warm water with epsom salt ~20 mins 2-3 times daily  To keep follow up with podiatrist

## 2018-02-05 NOTE — Progress Notes (Signed)
Careteam: Patient Care Team: [name] Boys, DO as PCP - General (Internal Medicine)  Advanced Directive information Does Patient Have a Medical Advance Directive?: No  Allergies  Allergen Reactions  . Other Itching and Swelling    Patient had an allergy to an eye drop, she does not know the name of the drop.  . Prednisolone Swelling    Chief Complaint  Patient presents with  . Follow-up    pt is being seen for a follow up due to rash on both hands. Pt states that rash has gotten somewhat better but not completely gone.      HPI: Patient is a 72 y.o. female seen in the office today due to follow up rash. Pt was seen on 01/23/18 and treated with betamethasone cream twice daily with improvement but not resolved. Continues to be itchy at times. no pain or swelling or heat. Would like refill on cream.  Never had this issue before.   Ingrown toe nail to left great toe with some improvement- has appt with podiatrist. Still with some pain to lateral toe and nail. No drainage or heat.  Review of Systems:  Review of Systems  Constitutional: Negative for chills and fever.  Eyes: Positive for pain.  Musculoskeletal:       Painful area to left great toe.   Skin: Positive for itching and rash.    Past Medical History:  Diagnosis Date  . Acute bronchiolitis due to other infectious organisms   . Acute bronchiolitis due to respiratory syncytial virus (RSV)   . Arthritis    OA AND PAIN RIGHT KNEE  . Degeneration of intervertebral disc, site unspecified   . Disorder of bone and cartilage, unspecified   . Disturbance of skin sensation   . Encounter for long-term (current) use of other medications   . GERD (gastroesophageal reflux disease)    PT STATES SEVERE REFLUX IF SHE DOES NOT TAKE HER OMEPRAZOLE  . Headache(784.0)    WHEN REFLUX SEVERE  . Hypothyroidism   . Long term (current) use of anticoagulants   . Osteoarthrosis, unspecified whether generalized or localized, lower leg     . Osteoporosis, unspecified   . Other and unspecified hyperlipidemia   . Other atopic dermatitis and related conditions   . Other malaise and fatigue   . Other specified pre-operative examination 11/07/2011  . Thoracic or lumbosacral neuritis or radiculitis, unspecified   . Unspecified vitamin D deficiency   . Vegetarian diet    Past Surgical History:  Procedure Laterality Date  . CERVICAL BIOPSY  W/ LOOP ELECTRODE EXCISION    . EYE SURGERY  2016   Left eye, Cataract Removal  . TEETH EXTRACTIONS--NO OTHER SURGERY    . TOTAL KNEE ARTHROPLASTY  11/28/2011   Procedure: TOTAL KNEE ARTHROPLASTY;  Surgeon: Jacki Cones, MD;  Location: [facility];  Service: Orthopedics;  Laterality: Right;  . TUBAL LIGATION     Social History:   reports that she has never smoked. She has never used smokeless tobacco. She reports that she does not drink alcohol or use drugs.  Family History  Problem Relation Age of Onset  . Alport syndrome Brother   . Breast cancer Neg Hx     Medications: Patient's Medications  New Prescriptions   No medications on file  Previous Medications   ASPIRIN 81 MG TABLET    Take 81 mg by mouth daily.   BETAMETHASONE DIPROPIONATE (DIPROLENE) 0.05 % CREAM    Apply topically 2 (two) times  daily for 14 days. Apply to affected areas on finger web   CALCIUM CARBONATE (OS-CAL) 600 MG TABS    Take 600 mg by mouth 2 (two) times daily with a meal. Take one tablet twice a day for calcium supplement   FERROUS SULFATE 325 (65 FE) MG TABLET    Take 1 tablet (325 mg total) by mouth daily with breakfast.   LEVOTHYROXINE (SYNTHROID, LEVOTHROID) 25 MCG TABLET    TAKE 1 & 1/2 (ONE & ONE-HALF) TABLETS BY MOUTH  DAILY BEFORE BREAKFAST   OMEPRAZOLE (PRILOSEC) 20 MG CAPSULE    TAKE ONE CAPSULE BY MOUTH ONCE DAILY TO REDUCE  STOMACH ACID   SIMVASTATIN (ZOCOR) 20 MG TABLET    TAKE ONE TABLET BY MOUTH ONCE DAILY FOR CHOLESTEROL  Modified Medications   No medications on file  Discontinued  Medications   No medications on file     Physical Exam:  Vitals:   02/05/18 1131  BP: 136/78  Pulse: 73  Temp: 98 F (36.7 C)  TempSrc: Oral  SpO2: 97%  Weight: 128 lb (58.1 kg)  Height: 4\' 10"  (1.473 m)   Body mass index is 26.75 kg/m.  Physical Exam  Constitutional: She appears well-developed and well-nourished.  Eyes: Pupils are equal, round, and reactive to light.  Cardiovascular: Normal rate, regular rhythm and normal heart sounds.  Pulmonary/Chest: Effort normal and breath sounds normal.  Skin: Skin is warm and dry. Rash (small faded raised area to ring finger) noted.  Left Great toe tender around toe nail and to nail bed, no redness, heat or drainage.     Labs reviewed: Basic Metabolic Panel: Recent Labs    08/14/17 0849  NA 138  K 4.4  CL 102  CO2 30  GLUCOSE 103*  BUN 11  CREATININE 0.53*  CALCIUM 9.8  TSH 2.96   Liver Function Tests: Recent Labs    08/14/17 0849  AST 21  ALT 21  BILITOT 0.4  PROT 6.6   No results for input(s): LIPASE, AMYLASE in the last 8760 hours. No results for input(s): AMMONIA in the last 8760 hours. CBC: Recent Labs    08/14/17 0849  WBC 8.1  NEUTROABS 3,985  HGB 13.0  HCT 38.3  MCV 88.5  PLT 284   Lipid Panel: Recent Labs    08/14/17 0849  CHOL 213*  HDL 55  LDLCALC 133*  TRIG 130  CHOLHDL 3.9   TSH: Recent Labs    08/14/17 0849  TSH 2.96   A1C: Lab Results  Component Value Date   HGBA1C 5.6 12/11/2016     Assessment/Plan 1. Psoriasis (a type of skin inflammation) Improved, refill provided, if worsen may need dermatology referral.  - betamethasone dipropionate (DIPROLENE) 0.05 % cream; Apply topically 2 (two) times daily for 14 days. Apply to affected areas on finger web  Dispense: 15 g; Refill: 0  2. Ingrown nail of great toe of left foot No active infection. To use epsom salt soaks 2-3 times daily  Has podiatry appt scheduled.  Next appt:  Janene Harvey. Biagio Borg  [facility] [phone]

## 2018-02-10 ENCOUNTER — Ambulatory Visit: Payer: Medicare Other

## 2018-02-10 ENCOUNTER — Ambulatory Visit (INDEPENDENT_AMBULATORY_CARE_PROVIDER_SITE_OTHER): Payer: Medicare Other

## 2018-02-10 DIAGNOSIS — Z23 Encounter for immunization: Secondary | ICD-10-CM | POA: Diagnosis not present

## 2018-02-10 DIAGNOSIS — M810 Age-related osteoporosis without current pathological fracture: Secondary | ICD-10-CM

## 2018-02-10 MED ORDER — DENOSUMAB 60 MG/ML ~~LOC~~ SOSY
60.0000 mg | PREFILLED_SYRINGE | Freq: Once | SUBCUTANEOUS | Status: AC
  Administered 2018-02-10: 60 mg via SUBCUTANEOUS

## 2018-02-14 DIAGNOSIS — H2511 Age-related nuclear cataract, right eye: Secondary | ICD-10-CM | POA: Diagnosis not present

## 2018-02-14 DIAGNOSIS — Z961 Presence of intraocular lens: Secondary | ICD-10-CM | POA: Insufficient documentation

## 2018-02-14 DIAGNOSIS — H02831 Dermatochalasis of right upper eyelid: Secondary | ICD-10-CM | POA: Insufficient documentation

## 2018-02-14 DIAGNOSIS — H35342 Macular cyst, hole, or pseudohole, left eye: Secondary | ICD-10-CM | POA: Diagnosis not present

## 2018-02-14 DIAGNOSIS — H25011 Cortical age-related cataract, right eye: Secondary | ICD-10-CM | POA: Diagnosis not present

## 2018-02-14 DIAGNOSIS — H26492 Other secondary cataract, left eye: Secondary | ICD-10-CM | POA: Diagnosis not present

## 2018-02-14 DIAGNOSIS — H02834 Dermatochalasis of left upper eyelid: Secondary | ICD-10-CM | POA: Insufficient documentation

## 2018-02-14 DIAGNOSIS — H43811 Vitreous degeneration, right eye: Secondary | ICD-10-CM | POA: Diagnosis not present

## 2018-02-18 ENCOUNTER — Telehealth: Payer: Self-pay | Admitting: *Deleted

## 2018-02-18 NOTE — Telephone Encounter (Signed)
Spoke with patient's son Elby Showers, okay per ROI. Advised patient is overdue for repeat pap smear. Elby Showers will speak with the patient and return call to schedule an appointment.

## 2018-02-18 NOTE — Telephone Encounter (Signed)
Patient in for 06 recall since 07/2017 Needs 6 month pap appt scheduled.

## 2018-02-21 ENCOUNTER — Other Ambulatory Visit: Payer: Medicare Other

## 2018-02-21 DIAGNOSIS — E034 Atrophy of thyroid (acquired): Secondary | ICD-10-CM

## 2018-02-21 DIAGNOSIS — Z79899 Other long term (current) drug therapy: Secondary | ICD-10-CM | POA: Diagnosis not present

## 2018-02-21 DIAGNOSIS — E785 Hyperlipidemia, unspecified: Secondary | ICD-10-CM

## 2018-02-21 LAB — LIPID PANEL
CHOLESTEROL: 206 mg/dL — AB (ref <200)
HDL: 55 mg/dL (ref >50)
LDL Cholesterol (Calc): 126 mg/dL (calc) — ABNORMAL HIGH
Non-HDL Cholesterol (Calc): 151 mg/dL (calc) — ABNORMAL HIGH (ref <130)
Total CHOL/HDL Ratio: 3.7 (calc) (ref <5.0)
Triglycerides: 136 mg/dL (ref <150)

## 2018-02-21 LAB — BASIC METABOLIC PANEL WITH GFR
BUN: 9 mg/dL (ref 7–25)
CALCIUM: 9.4 mg/dL (ref 8.6–10.4)
CO2: 26 mmol/L (ref 20–32)
Chloride: 104 mmol/L (ref 98–110)
Creat: 0.61 mg/dL (ref 0.60–0.93)
GFR, Est African American: 104 mL/min/{1.73_m2} (ref >=60)
GFR, Est Non African American: 89 mL/min/{1.73_m2} (ref >=60)
Glucose, Bld: 109 mg/dL — ABNORMAL HIGH (ref 65–99)
POTASSIUM: 4.4 mmol/L (ref 3.5–5.3)
Sodium: 139 mmol/L (ref 135–146)

## 2018-02-21 LAB — ALT: ALT: 26 U/L (ref 6–29)

## 2018-02-21 LAB — TSH: TSH: 6.17 m[IU]/L — AB (ref 0.40–4.50)

## 2018-02-25 ENCOUNTER — Ambulatory Visit: Payer: Medicare Other | Admitting: Podiatry

## 2018-02-26 ENCOUNTER — Ambulatory Visit: Payer: Medicare Other

## 2018-02-26 ENCOUNTER — Other Ambulatory Visit: Payer: Self-pay

## 2018-02-26 ENCOUNTER — Encounter: Payer: Self-pay | Admitting: Internal Medicine

## 2018-02-26 ENCOUNTER — Ambulatory Visit (INDEPENDENT_AMBULATORY_CARE_PROVIDER_SITE_OTHER): Payer: Medicare Other | Admitting: Internal Medicine

## 2018-02-26 VITALS — BP 132/84 | HR 78 | Temp 98.1°F | Ht <= 58 in | Wt 130.0 lb

## 2018-02-26 DIAGNOSIS — E785 Hyperlipidemia, unspecified: Secondary | ICD-10-CM

## 2018-02-26 DIAGNOSIS — R7303 Prediabetes: Secondary | ICD-10-CM | POA: Diagnosis not present

## 2018-02-26 DIAGNOSIS — D649 Anemia, unspecified: Secondary | ICD-10-CM

## 2018-02-26 DIAGNOSIS — L309 Dermatitis, unspecified: Secondary | ICD-10-CM | POA: Diagnosis not present

## 2018-02-26 DIAGNOSIS — E034 Atrophy of thyroid (acquired): Secondary | ICD-10-CM

## 2018-02-26 MED ORDER — FERROUS SULFATE 325 (65 FE) MG PO TABS: 325.0000 mg | ORAL_TABLET | Freq: Every day | ORAL | 1 refills | Status: DC

## 2018-02-26 MED ORDER — LEVOTHYROXINE SODIUM 50 MCG PO TABS: 50.0000 ug | ORAL_TABLET | Freq: Every day | ORAL | 1 refills | Status: DC

## 2018-02-26 NOTE — Telephone Encounter (Signed)
Left message to call Carmesha Morocco at [phone]. 

## 2018-02-26 NOTE — Patient Instructions (Signed)
INCREASE LEVOTHYROXINE DAILY FOR THYROID  PLEASE SCHEDULE NONFASTING LAB IN 6 WEEKS  Continue other medications as ordered  Follow up in 6 mos for CPE/ECG. Fasting labs prior to appt
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
## 2018-02-28 ENCOUNTER — Encounter: Payer: Self-pay | Admitting: Obstetrics and Gynecology

## 2018-02-28 ENCOUNTER — Other Ambulatory Visit (HOSPITAL_COMMUNITY)
Admission: RE | Admit: 2018-02-28 | Discharge: 2018-02-28 | Disposition: A | Discharge status: Home / Self Care | Payer: Medicare Other | Source: Ambulatory Visit | Attending: Obstetrics and Gynecology | Admitting: Obstetrics and Gynecology

## 2018-02-28 ENCOUNTER — Ambulatory Visit (INDEPENDENT_AMBULATORY_CARE_PROVIDER_SITE_OTHER): Payer: Medicare Other | Admitting: Obstetrics and Gynecology

## 2018-02-28 VITALS — BP 142/78 | HR 88 | Ht <= 58 in | Wt 130.0 lb

## 2018-02-28 DIAGNOSIS — Z1272 Encounter for screening for malignant neoplasm of vagina: Secondary | ICD-10-CM | POA: Insufficient documentation

## 2018-02-28 DIAGNOSIS — Z01419 Encounter for gynecological examination (general) (routine) without abnormal findings: Secondary | ICD-10-CM | POA: Diagnosis not present

## 2018-02-28 DIAGNOSIS — Z87410 Personal history of cervical dysplasia: Secondary | ICD-10-CM | POA: Insufficient documentation

## 2018-02-28 DIAGNOSIS — Z124 Encounter for screening for malignant neoplasm of cervix: Secondary | ICD-10-CM

## 2018-02-28 DIAGNOSIS — Z87411 Personal history of vaginal dysplasia: Secondary | ICD-10-CM

## 2018-02-28 NOTE — Patient Instructions (Addendum)
EXERCISE AND DIET:  We recommended that you start or continue a regular exercise program for good health. Regular exercise means any activity that makes your heart beat faster and makes you sweat.  We recommend exercising at least 30 minutes per day at least 3 days a week, preferably 4 or 5.  We also recommend a diet low in fat and sugar.  Inactivity, poor dietary choices and obesity can cause diabetes, heart attack, stroke, and kidney damage, among others.    ALCOHOL AND SMOKING:  Women should limit their alcohol intake to no more than 7 drinks/beers/glasses of wine (combined, not each!) per week. Moderation of alcohol intake to this level decreases your risk of breast cancer and liver damage. And of course, no recreational drugs are part of a healthy lifestyle.  And absolutely no smoking or even second hand smoke. Most people know smoking can cause heart and lung diseases, but did you know it also contributes to weakening of your bones? Aging of your skin?  Yellowing of your teeth and nails?  CALCIUM AND VITAMIN D:  Adequate intake of calcium and Vitamin D are recommended.  The recommendations for exact amounts of these supplements seem to change often, but generally speaking 1,200 mg of calcium (either carbonate or citrate) and 800 units of Vitamin D per day seems prudent. Certain women may benefit from higher intake of Vitamin D.  If you are among these women, your doctor will have told you during your visit.    PAP SMEARS:  Pap smears, to check for cervical cancer or precancers,  have traditionally been done yearly, although recent scientific advances have shown that most women can have pap smears less often.  However, every woman still should have a physical exam from her gynecologist every year. It will include a breast check, inspection of the vulva and vagina to check for abnormal growths or skin changes, a visual exam of the cervix, and then an exam to evaluate the size and shape of the uterus and  ovaries.  And after 74 years of age, a rectal exam is indicated to check for rectal cancers. We will also provide age appropriate advice regarding health maintenance, like when you should have certain vaccines, screening for sexually transmitted diseases, bone density testing, colonoscopy, mammograms, etc.   MAMMOGRAMS:  All women over 40 years old should have a yearly mammogram. Many facilities now offer a "3D" mammogram, which may cost around $50 extra out of pocket. If possible,  we recommend you accept the option to have the 3D mammogram performed.  It both reduces the number of women who will be called back for extra views which then turn out to be normal, and it is better than the routine mammogram at detecting truly abnormal areas.    COLONOSCOPY:  Colonoscopy to screen for colon cancer is recommended for all women at age 50.  We know, you hate the idea of the prep.  We agree, BUT, having colon cancer and not knowing it is worse!!  Colon cancer so often starts as a polyp that can be seen and removed at colonscopy, which can quite literally save your life!  And if your first colonoscopy is normal and you have no family history of colon cancer, most women don't have to have it again for 10 years.  Once every ten years, you can do something that may end up saving your life, right?  We will be happy to help you get it scheduled when you are ready.    Be sure to check your insurance coverage so you understand how much it will cost.  It may be covered as a preventative service at no cost, but you should check your particular policy.      Breast Self-Awareness Breast self-awareness means being familiar with how your breasts look and feel. It involves checking your breasts regularly and reporting any changes to your health care provider. Practicing breast self-awareness is important. A change in your breasts can be a sign of a serious medical problem. Being familiar with how your breasts look and feel allows  you to find any problems early, when treatment is more likely to be successful. All women should practice breast self-awareness, including women who have had breast implants. How to do a breast self-exam One way to learn what is normal for your breasts and whether your breasts are changing is to do a breast self-exam. To do a breast self-exam: Look for Changes  1. Remove all the clothing above your waist. 2. Stand in front of a mirror in a room with good lighting. 3. Put your hands on your hips. 4. Push your hands firmly downward. 5. Compare your breasts in the mirror. Look for differences between them (asymmetry), such as: ? Differences in shape. ? Differences in size. ? Puckers, dips, and bumps in one breast and not the other. 6. Look at each breast for changes in your skin, such as: ? Redness. ? Scaly areas. 7. Look for changes in your nipples, such as: ? Discharge. ? Bleeding. ? Dimpling. ? Redness. ? A change in position. Feel for Changes  Carefully feel your breasts for lumps and changes. It is best to do this while lying on your back on the floor and again while sitting or standing in the shower or tub with soapy water on your skin. Feel each breast in the following way:  Place the arm on the side of the breast you are examining above your head.  Feel your breast with the other hand.  Start in the nipple area and make  inch (2 cm) overlapping circles to feel your breast. Use the pads of your three middle fingers to do this. Apply light pressure, then medium pressure, then firm pressure. The light pressure will allow you to feel the tissue closest to the skin. The medium pressure will allow you to feel the tissue that is a little deeper. The firm pressure will allow you to feel the tissue close to the ribs.  Continue the overlapping circles, moving downward over the breast until you feel your ribs below your breast.  Move one finger-width toward the center of the body.  Continue to use the  inch (2 cm) overlapping circles to feel your breast as you move slowly up toward your collarbone.  Continue the up and down exam using all three pressures until you reach your armpit.  Write Down What You Find  Write down what is normal for each breast and any changes that you find. Keep a written record with breast changes or normal findings for each breast. By writing this information down, you do not need to depend only on memory for size, tenderness, or location. Write down where you are in your menstrual cycle, if you are still menstruating. If you are having trouble noticing differences in your breasts, do not get discouraged. With time you will become more familiar with the variations in your breasts and more comfortable with the exam. How often should I examine my breasts? Examine   your breasts every month. If you are breastfeeding, the best time to examine your breasts is after a feeding or after using a breast pump. If you menstruate, the best time to examine your breasts is 5-7 days after your period is over. During your period, your breasts are lumpier, and it may be more difficult to notice changes. When should I see my health care provider? See your health care provider if you notice:  A change in shape or size of your breasts or nipples.  A change in the skin of your breast or nipples, such as a reddened or scaly area.  Unusual discharge from your nipples.  A lump or thick area that was not there before.  Pain in your breasts.  Anything that concerns you.  This information is not intended to replace advice given to you by your health care provider. Make sure you discuss any questions you have with your health care provider. Document Released: 04/23/2005 Document Revised: 09/29/2015 Document Reviewed: 03/13/2015 Elsevier Interactive Patient Education  2018 Elsevier Inc.  

## 2018-02-28 NOTE — Progress Notes (Signed)
72 y.o. G49P3003 Married Asian Not Hispanic or Latino female here for annual exam.  The patient has a h/o CIN I, s/p LEEP, then VAIN II, s/p FU. Last pap in 10/18 negative with negative hpv.  No bleeding, not sexually active (husband had prostate surgery).    No LMP recorded. Patient is postmenopausal.          Sexually active: Yes.    The current method of family planning is none.    Exercising: Yes.    Home exercise routine includes work. Smoker:  no  Health Maintenance: Pap:  October 2018, negative,negative hpv History of abnormal Pap:  Yes, history of leep and colpo MMG:  12/17/2016 BI-RADS CATEGORY  1: Negative BMD:   01/25/2017, osteopenia (followed by primary MD), on prolia.  Colonoscopy: never, she is doing IFOB with primary.  TDaP:  2012 Gardasil: never   reports that she has never smoked. She has never used smokeless tobacco. She reports that she does not drink alcohol or use drugs. 2 sons, one daughter, 69 grandson's (youngest in college)  Past Medical History:  Diagnosis Date  . Acute bronchiolitis due to other infectious organisms   . Acute bronchiolitis due to respiratory syncytial virus (RSV)   . Arthritis    OA AND PAIN RIGHT KNEE  . Degeneration of intervertebral disc, site unspecified   . Disorder of bone and cartilage, unspecified   . Disturbance of skin sensation   . Encounter for long-term (current) use of other medications   . GERD (gastroesophageal reflux disease)    PT STATES SEVERE REFLUX IF SHE DOES NOT TAKE HER OMEPRAZOLE  . Headache(784.0)    WHEN REFLUX SEVERE  . Hypothyroidism   . Long term (current) use of anticoagulants   . Osteoarthrosis, unspecified whether generalized or localized, lower leg   . Osteoporosis, unspecified   . Other and unspecified hyperlipidemia   . Other atopic dermatitis and related conditions   . Other malaise and fatigue   . Other specified pre-operative examination 11/07/2011  . Thoracic or lumbosacral neuritis or  radiculitis, unspecified   . Unspecified vitamin D deficiency   . Vegetarian diet     Past Surgical History:  Procedure Laterality Date  . CERVICAL BIOPSY  W/ LOOP ELECTRODE EXCISION    . EYE SURGERY  2016   Left eye, Cataract Removal  . TEETH EXTRACTIONS--NO OTHER SURGERY    . TOTAL KNEE ARTHROPLASTY  11/28/2011   Procedure: TOTAL KNEE ARTHROPLASTY;  Surgeon: Jacki Cones, MD;  Location: [facility];  Service: Orthopedics;  Laterality: Right;  . TUBAL LIGATION      Current Outpatient Medications  Medication Sig Dispense Refill  . aspirin 81 MG tablet Take 81 mg by mouth daily.    . calcium carbonate (OS-CAL) 600 MG TABS Take 600 mg by mouth 2 (two) times daily with a meal. Take one tablet twice a day for calcium supplement    . ferrous sulfate 325 (65 FE) MG tablet Take 1 tablet (325 mg total) by mouth daily with breakfast. 90 tablet 1  . levothyroxine (SYNTHROID, LEVOTHROID) 50 MCG tablet Take 1 tablet (50 mcg total) by mouth daily. 90 tablet 1  . omeprazole (PRILOSEC) 20 MG capsule TAKE ONE CAPSULE BY MOUTH ONCE DAILY TO REDUCE  STOMACH ACID 90 capsule 3  . simvastatin (ZOCOR) 20 MG tablet TAKE ONE TABLET BY MOUTH ONCE DAILY FOR CHOLESTEROL 90 tablet 3   No current facility-administered medications for this visit.     Family History  Problem Relation Age of Onset  . Alport syndrome Brother   . Breast cancer Neg Hx     Review of Systems  Constitutional: Negative.   HENT: Negative.   Eyes: Negative.   Respiratory: Negative.   Cardiovascular: Negative.   Gastrointestinal: Negative.   Endocrine: Negative.   Genitourinary: Negative.   Musculoskeletal: Negative.   Skin: Negative.   Allergic/Immunologic: Negative.   Neurological: Negative.   Hematological: Negative.   Psychiatric/Behavioral: Negative.   All other systems reviewed and are negative.   Exam:   BP (!) 142/78   Pulse 88   Ht 4\' 9"  (1.448 m)   Wt 130 lb (59 kg)   SpO2 98%   BMI 28.13 kg/m   Weight  change: @WEIGHTCHANGE @ Height:   Height: 4\' 9"  (144.8 cm)  Ht Readings from Last 3 Encounters:  02/28/18 4\' 9"  (1.448 m)  02/26/18 4\' 10"  (1.473 m)  02/05/18 4\' 10"  (1.473 m)    General appearance: alert, cooperative and appears stated age Head: Normocephalic, without obvious abnormality, atraumatic Neck: no adenopathy, supple, symmetrical, trachea midline and thyroid normal to inspection and palpation Lungs: clear to auscultation bilaterally Cardiovascular: regular rate and rhythm Breasts: normal appearance, no masses or tenderness Abdomen: soft, non-tender; non distended,  no masses,  no organomegaly Extremities: extremities normal, atraumatic, no cyanosis or edema Skin: Skin color, texture, turgor normal. No rashes or lesions Lymph nodes: Cervical, supraclavicular, and axillary nodes normal. No abnormal inguinal nodes palpated Neurologic: Grossly normal   Pelvic: External genitalia:  no lesions              Urethra:  normal appearing urethra with no masses, tenderness or lesions              Bartholins and Skenes: normal                 Vagina: normal appearing vagina with normal color and discharge, no lesions              Cervix: flush with vagina               Bimanual Exam:  Uterus:  normal size, contour, position, consistency, mobility, non-tender              Adnexa: no mass, fullness, tenderness               Rectovaginal: Confirms               Anus:  normal sphincter tone, no lesions  Chaperone was present for exam.  A:  Well Woman with normal exam  H/O CIN I, s/p LEEP  H/O VAIN II, s/p 5FU    P:   Pap from cervix and vagina with hpv  Discussed breast self exam  Discussed calcium and vit D intake  Labs with primary  Recent IFOB with primary  Mammogram is scheduled

## 2018-03-05 LAB — CYTOLOGY - PAP
Diagnosis: NEGATIVE
HPV (WINDOPATH): NOT DETECTED

## 2018-03-13 NOTE — Telephone Encounter (Signed)
Patient was seen on 02/28/2018. Encounter closed.

## 2018-04-01 ENCOUNTER — Ambulatory Visit
Admission: RE | Admit: 2018-04-01 | Discharge: 2018-04-01 | Disposition: A | Discharge status: Home / Self Care | Payer: Medicare Other | Source: Ambulatory Visit | Attending: Internal Medicine | Admitting: Internal Medicine

## 2018-04-01 DIAGNOSIS — Z1231 Encounter for screening mammogram for malignant neoplasm of breast: Secondary | ICD-10-CM

## 2018-04-09 ENCOUNTER — Other Ambulatory Visit: Payer: Medicare Other

## 2018-04-09 DIAGNOSIS — E034 Atrophy of thyroid (acquired): Secondary | ICD-10-CM | POA: Diagnosis not present

## 2018-04-10 LAB — TSH: TSH: 2.32 m[IU]/L (ref 0.40–4.50)

## 2018-08-04 ENCOUNTER — Other Ambulatory Visit: Payer: Self-pay | Admitting: *Deleted

## 2018-08-04 MED ORDER — DENOSUMAB 60 MG/ML ~~LOC~~ SOSY: 60.0000 mg | PREFILLED_SYRINGE | SUBCUTANEOUS | 0 refills | Status: DC

## 2018-08-04 NOTE — Telephone Encounter (Signed)
Prolia worked by Lisa. Patient to get Prolia through the Pharmacy due to Insurance Coverage and out of pocket pay. Faxed Rx to pharmacy. Patient is aware.  

## 2018-08-11 ENCOUNTER — Other Ambulatory Visit: Payer: Self-pay | Admitting: Nurse Practitioner

## 2018-08-11 DIAGNOSIS — E785 Hyperlipidemia, unspecified: Secondary | ICD-10-CM

## 2018-08-11 DIAGNOSIS — Z79899 Other long term (current) drug therapy: Secondary | ICD-10-CM

## 2018-08-11 DIAGNOSIS — E034 Atrophy of thyroid (acquired): Secondary | ICD-10-CM

## 2018-08-11 DIAGNOSIS — D649 Anemia, unspecified: Secondary | ICD-10-CM

## 2018-08-11 DIAGNOSIS — R7303 Prediabetes: Secondary | ICD-10-CM

## 2018-08-11 DIAGNOSIS — E2839 Other primary ovarian failure: Secondary | ICD-10-CM

## 2018-08-13 ENCOUNTER — Other Ambulatory Visit: Payer: Self-pay

## 2018-08-13 ENCOUNTER — Other Ambulatory Visit: Payer: Medicare Other

## 2018-08-13 ENCOUNTER — Ambulatory Visit (INDEPENDENT_AMBULATORY_CARE_PROVIDER_SITE_OTHER): Payer: Medicare Other | Admitting: *Deleted

## 2018-08-13 ENCOUNTER — Ambulatory Visit: Payer: Medicare Other

## 2018-08-13 DIAGNOSIS — M810 Age-related osteoporosis without current pathological fracture: Secondary | ICD-10-CM

## 2018-08-13 MED ORDER — DENOSUMAB 60 MG/ML ~~LOC~~ SOSY
60.0000 mg | PREFILLED_SYRINGE | Freq: Once | SUBCUTANEOUS | Status: AC
  Administered 2018-08-13: 60 mg via SUBCUTANEOUS

## 2018-08-18 ENCOUNTER — Other Ambulatory Visit: Payer: Medicare Other

## 2018-08-28 ENCOUNTER — Encounter: Payer: Medicare Other | Admitting: Nurse Practitioner

## 2018-08-28 ENCOUNTER — Encounter: Payer: Self-pay | Admitting: Nurse Practitioner

## 2018-08-28 ENCOUNTER — Ambulatory Visit: Payer: Medicare Other | Admitting: Nurse Practitioner

## 2018-08-31 ENCOUNTER — Encounter: Payer: Self-pay | Admitting: Nurse Practitioner

## 2018-09-03 ENCOUNTER — Ambulatory Visit: Payer: Self-pay

## 2018-09-08 ENCOUNTER — Other Ambulatory Visit: Payer: Medicare Other

## 2018-09-08 ENCOUNTER — Telehealth: Payer: Self-pay

## 2018-09-08 ENCOUNTER — Other Ambulatory Visit: Payer: Self-pay

## 2018-09-08 DIAGNOSIS — R7303 Prediabetes: Secondary | ICD-10-CM | POA: Diagnosis not present

## 2018-09-08 DIAGNOSIS — E034 Atrophy of thyroid (acquired): Secondary | ICD-10-CM | POA: Diagnosis not present

## 2018-09-08 DIAGNOSIS — E785 Hyperlipidemia, unspecified: Secondary | ICD-10-CM | POA: Diagnosis not present

## 2018-09-08 DIAGNOSIS — K219 Gastro-esophageal reflux disease without esophagitis: Secondary | ICD-10-CM

## 2018-09-08 DIAGNOSIS — D649 Anemia, unspecified: Secondary | ICD-10-CM | POA: Diagnosis not present

## 2018-09-08 MED ORDER — SIMVASTATIN 20 MG PO TABS: ORAL_TABLET | ORAL | 1 refills | Status: DC

## 2018-09-08 MED ORDER — OMEPRAZOLE 20 MG PO CPDR: DELAYED_RELEASE_CAPSULE | ORAL | 1 refills | Status: DC

## 2018-09-08 MED ORDER — LEVOTHYROXINE SODIUM 50 MCG PO TABS: 50.0000 ug | ORAL_TABLET | Freq: Every day | ORAL | 1 refills | Status: DC

## 2018-09-08 NOTE — Telephone Encounter (Signed)
Patient was in office for labs and stopped by the clinical area requesting refills on all medications.  RX's sent as requested

## 2018-09-09 LAB — CBC WITH DIFFERENTIAL/PLATELET
Absolute Monocytes: 730 cells/uL (ref 200–950)
Basophils Absolute: 122 cells/uL (ref 0–200)
Basophils Relative: 1.6 %
Eosinophils Absolute: 152 cells/uL (ref 15–500)
Eosinophils Relative: 2 %
HCT: 36.7 % (ref 35.0–45.0)
Hemoglobin: 12.4 g/dL (ref 11.7–15.5)
Lymphs Abs: 2698 cells/uL (ref 850–3900)
MCH: 31.2 pg (ref 27.0–33.0)
MCHC: 33.8 g/dL (ref 32.0–36.0)
MCV: 92.2 fL (ref 80.0–100.0)
MPV: 9.9 fL (ref 7.5–12.5)
Monocytes Relative: 9.6 %
Neutro Abs: 3899 cells/uL (ref 1500–7800)
Neutrophils Relative %: 51.3 %
Platelets: 281 10*3/uL (ref 140–400)
RBC: 3.98 10*6/uL (ref 3.80–5.10)
RDW: 12.3 % (ref 11.0–15.0)
Total Lymphocyte: 35.5 %
WBC: 7.6 10*3/uL (ref 3.8–10.8)

## 2018-09-09 LAB — COMPLETE METABOLIC PANEL WITH GFR
AG Ratio: 1.6 (calc) (ref 1.0–2.5)
ALT: 24 U/L (ref 6–29)
AST: 22 U/L (ref 10–35)
Albumin: 4.2 g/dL (ref 3.6–5.1)
Alkaline phosphatase (APISO): 60 U/L (ref 37–153)
BUN/Creatinine Ratio: 15 (calc) (ref 6–22)
BUN: 8 mg/dL (ref 7–25)
CO2: 26 mmol/L (ref 20–32)
Calcium: 9 mg/dL (ref 8.6–10.4)
Chloride: 106 mmol/L (ref 98–110)
Creat: 0.54 mg/dL — ABNORMAL LOW (ref 0.60–0.93)
GFR, Est African American: 107 mL/min/{1.73_m2} (ref >=60)
GFR, Est Non African American: 92 mL/min/{1.73_m2} (ref >=60)
Globulin: 2.7 g/dL (calc) (ref 1.9–3.7)
Glucose, Bld: 106 mg/dL — ABNORMAL HIGH (ref 65–99)
Potassium: 4.3 mmol/L (ref 3.5–5.3)
Sodium: 138 mmol/L (ref 135–146)
Total Bilirubin: 0.5 mg/dL (ref 0.2–1.2)
Total Protein: 6.9 g/dL (ref 6.1–8.1)

## 2018-09-09 LAB — T4, FREE: Free T4: 1.1 ng/dL (ref 0.8–1.8)

## 2018-09-09 LAB — URINALYSIS, ROUTINE W REFLEX MICROSCOPIC
Bilirubin Urine: NEGATIVE
Glucose, UA: NEGATIVE
Hgb urine dipstick: NEGATIVE
Ketones, ur: NEGATIVE
Leukocytes,Ua: NEGATIVE
Nitrite: NEGATIVE
Protein, ur: NEGATIVE
Specific Gravity, Urine: 1.006 (ref 1.001–1.03)
pH: 6.5 (ref 5.0–8.0)

## 2018-09-09 LAB — FERRITIN: Ferritin: 116 ng/mL (ref 16–288)

## 2018-09-09 LAB — HEMOGLOBIN A1C
Hgb A1c MFr Bld: 5.9 % of total Hgb — ABNORMAL HIGH (ref <5.7)
Mean Plasma Glucose: 123 (calc)
eAG (mmol/L): 6.8 (calc)

## 2018-09-09 LAB — LIPID PANEL
Cholesterol: 212 mg/dL — ABNORMAL HIGH (ref <200)
HDL: 54 mg/dL (ref >=50)
LDL Cholesterol (Calc): 134 mg/dL (calc) — ABNORMAL HIGH
Non-HDL Cholesterol (Calc): 158 mg/dL (calc) — ABNORMAL HIGH (ref <130)
Total CHOL/HDL Ratio: 3.9 (calc) (ref <5.0)
Triglycerides: 127 mg/dL (ref <150)

## 2018-09-09 LAB — TSH: TSH: 6.51 mIU/L — ABNORMAL HIGH (ref 0.40–4.50)

## 2018-09-09 LAB — IRON: Iron: 78 ug/dL (ref 45–160)

## 2018-09-15 ENCOUNTER — Encounter: Payer: Medicare Other | Admitting: Nurse Practitioner

## 2018-09-15 ENCOUNTER — Other Ambulatory Visit: Payer: Self-pay

## 2018-09-16 ENCOUNTER — Ambulatory Visit (INDEPENDENT_AMBULATORY_CARE_PROVIDER_SITE_OTHER): Payer: Medicare Other | Admitting: Nurse Practitioner

## 2018-09-16 ENCOUNTER — Encounter: Payer: Self-pay | Admitting: Nurse Practitioner

## 2018-09-16 ENCOUNTER — Other Ambulatory Visit: Payer: Self-pay

## 2018-09-16 DIAGNOSIS — Z0000 Encounter for general adult medical examination without abnormal findings: Secondary | ICD-10-CM

## 2018-09-16 NOTE — Progress Notes (Signed)
   This service is provided via telemedicine  No vital signs collected/recorded due to the encounter was a telemedicine visit.   Location of patient (ex: home, work):  Home  Patient consents to a telephone visit: Yes  Location of the provider (ex: office, home):  [facility], Office   Name of any referring provider: N/A  Names of all persons participating in the telemedicine service and their role in the encounter:  S.Chrae B/CMA, Abbey Chatters, NP, son (Hetal) and Patient   Time spent on call:  12 min with medical assistant

## 2018-09-16 NOTE — Progress Notes (Signed)
Subjective:   [name] [name] is a 73 y.o. female who presents for Medicare Annual (Subsequent) preventive examination.  Review of Systems:   Cardiac Risk Factors include: advanced age (>58men, >78 women);dyslipidemia     Objective:     Vitals: There were no vitals taken for this visit.  There is no height or weight on file to calculate BMI.  Advanced Directives 09/16/2018 02/05/2018 08/27/2017 02/03/2017 05/11/2016 05/09/2016 02/08/2016  Does Patient Have a Medical Advance Directive? No No No No No No No  Does patient want to make changes to medical advance directive? - - - - No - Patient declined Yes (MAU/Ambulatory/Procedural Areas - Information given) -  Would patient like information on creating a medical advance directive? No - Guardian declined - No - Patient declined - - Yes (ED - Information included in AVS) No - patient declined information    Tobacco Social History   Tobacco Use  Smoking Status Never Smoker  Smokeless Tobacco Never Used     Counseling given: Not Answered   Clinical Intake:  Pre-visit preparation completed: Yes  Pain : No/denies pain     BMI - recorded: 28.13 Nutritional Status: BMI 25 -29 Overweight Diabetes: No  How often do you need to have someone help you when you read instructions, pamphlets, or other written materials from your doctor or pharmacy?: 3 - Sometimes(language barriers) What is the last grade level you completed in school?: college  Interpreter Needed?: Yes Patient Declined Interpreter : Yes Interpretation services provided by: Family member interpreted visit Patient signed [place] waiver: No     Past Medical History:  Diagnosis Date  . Acute bronchiolitis due to other infectious organisms   . Acute bronchiolitis due to respiratory syncytial virus (RSV)   . Arthritis    OA AND PAIN RIGHT KNEE  . Degeneration of intervertebral disc, site unspecified   . Disorder of bone and cartilage, unspecified   . Disturbance of  skin sensation   . Encounter for long-term (current) use of other medications   . GERD (gastroesophageal reflux disease)    PT STATES SEVERE REFLUX IF SHE DOES NOT TAKE HER OMEPRAZOLE  . Headache(784.0)    WHEN REFLUX SEVERE  . Hypothyroidism   . Long term (current) use of anticoagulants   . Osteoarthrosis, unspecified whether generalized or localized, lower leg   . Osteoporosis, unspecified   . Other and unspecified hyperlipidemia   . Other atopic dermatitis and related conditions   . Other malaise and fatigue   . Other specified pre-operative examination 11/07/2011  . Thoracic or lumbosacral neuritis or radiculitis, unspecified   . Unspecified vitamin D deficiency   . Vegetarian diet    Past Surgical History:  Procedure Laterality Date  . CERVICAL BIOPSY  W/ LOOP ELECTRODE EXCISION    . EYE SURGERY  2016   Left eye, Cataract Removal  . TEETH EXTRACTIONS--NO OTHER SURGERY    . TOTAL KNEE ARTHROPLASTY  11/28/2011   Procedure: TOTAL KNEE ARTHROPLASTY;  Surgeon: Jacki Cones, MD;  Location: [facility];  Service: Orthopedics;  Laterality: Right;  . TUBAL LIGATION     Family History  Problem Relation Age of Onset  . Alport syndrome Brother   . Breast cancer Neg Hx    Social History   Socioeconomic History  . Marital status: Married    Spouse name: Not on file  . Number of children: Not on file  . Years of education: Not on file  . Highest education level:  Not on file  Occupational History  . Not on file  Social Needs  . Financial resource strain: Not hard at all  . Food insecurity:    Worry: Never true    Inability: Never true  . Transportation needs:    Medical: No    Non-medical: No  Tobacco Use  . Smoking status: Never Smoker  . Smokeless tobacco: Never Used  Substance and Sexual Activity  . Alcohol use: No  . Drug use: No  . Sexual activity: Yes    Birth control/protection: Post-menopausal  Lifestyle  . Physical activity:    Days per week: 3 days     Minutes per session: 30 min  . Stress: Not at all  Relationships  . Social connections:    Talks on phone: More than three times a week    Gets together: More than three times a week    Attends religious service: Never    Active member of club or organization: No    Attends meetings of clubs or organizations: Never    Relationship status: Married  Other Topics Concern  . Not on file  Social History Narrative  . Not on file    Outpatient Encounter Medications as of 09/16/2018  Medication Sig  . aspirin 81 MG tablet Take 81 mg by mouth daily.  . calcium carbonate (OS-CAL) 600 MG TABS Take 600 mg by mouth 2 (two) times daily with a meal.   . denosumab (PROLIA) 60 MG/ML SOSY injection Inject 60 mg into the skin every 6 (six) months.  . ferrous sulfate 325 (65 FE) MG tablet Take 1 tablet (325 mg total) by mouth daily with breakfast.  . levothyroxine (SYNTHROID) 50 MCG tablet Take 1 tablet (50 mcg total) by mouth daily.  Marland Kitchen omeprazole (PRILOSEC) 20 MG capsule TAKE ONE CAPSULE BY MOUTH ONCE DAILY TO REDUCE  STOMACH ACID  . simvastatin (ZOCOR) 20 MG tablet TAKE ONE TABLET BY MOUTH ONCE DAILY FOR CHOLESTEROL   No facility-administered encounter medications on file as of 09/16/2018.     Activities of Daily Living In your present state of health, do you have any difficulty performing the following activities: 09/16/2018  Hearing? N  Vision? N  Difficulty concentrating or making decisions? N  Walking or climbing stairs? N  Dressing or bathing? N  Doing errands, shopping? N  Preparing Food and eating ? N  Using the Toilet? N  In the past six months, have you accidently leaked urine? N  Do you have problems with loss of bowel control? N  Managing your Medications? N  Managing your Finances? N  Housekeeping or managing your Housekeeping? N  Some recent data might be hidden    Patient Care Team: [name], NP as PCP - General (Geriatric Medicine)    Assessment:   This is a  routine wellness examination for [name] [name].  Exercise Activities and Dietary recommendations Current Exercise Habits: Home exercise routine, Type of exercise: walking, Time (Minutes): 55, Frequency (Times/Week): 6, Weekly Exercise (Minutes/Week): 330, Intensity: Mild, Exercise limited by: None identified  Goals    . <enter goal here>     Starting 05/09/2016, I will maintain my current lifestyle.        Fall Risk Fall Risk  09/16/2018 02/26/2018 02/05/2018 01/23/2018 08/27/2017  Falls in the past year? - No No No No  Number falls in past yr: 0 - - - -  Injury with Fall? 0 - - - -   Is the patient's home free  of loose throw rugs in walkways, pet beds, electrical cords, etc?   yes      Grab bars in the bathroom? yes      Handrails on the stairs?   yes      Adequate lighting?   yes  Timed Get Up and Go performed: na  Depression Screen PHQ 2/9 Scores 09/16/2018 08/27/2017 07/23/2016 05/09/2016  PHQ - 2 Score 0 0 0 0     Cognitive Function MMSE - Mini Mental State Exam 08/27/2017 05/09/2016 11/19/2014  Orientation to time 4 5 4   Orientation to Place 5 5 5   Registration 3 3 3   Attention/ Calculation 5 5 5   Recall 3 3 3   Language- name 2 objects 2 2 2   Language- repeat 1 1 1   Language- follow 3 step command 3 3 3   Language- read & follow direction 1 1 1   Write a sentence 1 1 1   Copy design 1 1 1   Total score 29 30 29      6CIT Screen 09/16/2018  What Year? 0 points  What month? 0 points  What time? 0 points  Count back from 20 0 points  Months in reverse 0 points  Repeat phrase 0 points  Total Score 0    Immunization History  Administered Date(s) Administered  . Influenza, High Dose Seasonal PF 02/18/2017, 02/10/2018  . Influenza,inj,Quad PF,6+ Mos 01/07/2013, 02/09/2014, 01/19/2015, 02/24/2016  . Influenza-Unspecified 02/20/2012  . Pneumococcal Conjugate-13 09/29/2013  . Pneumococcal Polysaccharide-23 06/28/2010  . Tdap 03/28/2011    Qualifies for Shingles Vaccine? Unsure if  she has had chicken pox.   Screening Tests Health Maintenance  Topic Date Due  . Fecal DNA (Cologuard)  07/05/1993  . Hepatitis C Screening  09/16/2019 (Originally 06/03/1945)  . INFLUENZA VACCINE  12/06/2018  . MAMMOGRAM  04/02/2019  . TETANUS/TDAP  03/27/2021  . DEXA SCAN  Completed  . PNA vac Low Risk Adult  Completed    Cancer Screenings: Lung: Low Dose CT Chest recommended if Age 27-80 years, 30 pack-year currently smoking OR have quit w/in 15years. Patient does not qualify. Breast:  Up to date on Mammogram? Yes   Up to date of Bone Density/Dexa? Yes Colorectal: none, son states they will do cologuard.   Additional Screenings: : Hepatitis C Screening: declined.      Plan:      I have personally reviewed and noted the following in the patient's chart:   . Medical and social history . Use of alcohol, tobacco or illicit drugs  . Current medications and supplements . Functional ability and status . Nutritional status . Physical activity . Advanced directives . List of other physicians . Hospitalizations, surgeries, and ER visits in previous 12 months . Vitals . Screenings to include cognitive, depression, and falls . Referrals and appointments  In addition, I have reviewed and discussed with patient certain preventive protocols, quality metrics, and best practice recommendations. A written personalized care plan for preventive services as well as general preventive health recommendations were provided to patient.     Sharon SellerJessica K Ena Demary, NP  09/16/2018

## 2018-09-16 NOTE — Patient Instructions (Addendum)
[name] [name] , Thank you for taking time to come for your Medicare Wellness Visit. I appreciate your ongoing commitment to your health goals. Please review the following plan we discussed and let me know if I can assist you in the future.   Screening recommendations/referrals: Colonoscopy: please complete cologuard when it comes to your house per instructions  Mammogram up to date Bone Density up to date, due 01/2019 Recommended yearly ophthalmology/optometry visit for glaucoma screening and checkup Recommended yearly dental visit for hygiene and checkup  Vaccinations: Influenza vaccine due 12/2018 Pneumococcal vaccine up to date Tdap vaccine up to date Shingles vaccine -will look into, she has not had chicken pox    Advanced directives: declines.  Conditions/risks identified: none.  Next appointment: 1 year    Preventive Care 12 Years and Older, Female Preventive care refers to lifestyle choices and visits with your health care provider that can promote health and wellness. What does preventive care include?  A yearly physical exam. This is also called an annual well check.  Dental exams once or twice a year.  Routine eye exams. Ask your health care provider how often you should have your eyes checked.  Personal lifestyle choices, including:  Daily care of your teeth and gums.  Regular physical activity.  Eating a healthy diet.  Avoiding tobacco and drug use.  Limiting alcohol use.  Practicing safe sex.  Taking low-dose aspirin every day.  Taking vitamin and mineral supplements as recommended by your health care provider. What happens during an annual well check? The services and screenings done by your health care provider during your annual well check will depend on your age, overall health, lifestyle risk factors, and family history of disease. Counseling  Your health care provider may ask you questions about your:  Alcohol use.  Tobacco use.  Drug use.   Emotional well-being.  Home and relationship well-being.  Sexual activity.  Eating habits.  History of falls.  Memory and ability to understand (cognition).  Work and work Astronomer.  Reproductive health. Screening  You may have the following tests or measurements:  Height, weight, and BMI.  Blood pressure.  Lipid and cholesterol levels. These may be checked every 5 years, or more frequently if you are over 19 years old.  Skin check.  Lung cancer screening. You may have this screening every year starting at age 49 if you have a 30-pack-year history of smoking and currently smoke or have quit within the past 15 years.  Fecal occult blood test (FOBT) of the stool. You may have this test every year starting at age 47.  Flexible sigmoidoscopy or colonoscopy. You may have a sigmoidoscopy every 5 years or a colonoscopy every 10 years starting at age 23.  Hepatitis C blood test.  Hepatitis B blood test.  Sexually transmitted disease (STD) testing.  Diabetes screening. This is done by checking your blood sugar (glucose) after you have not eaten for a while (fasting). You may have this done every 1-3 years.  Bone density scan. This is done to screen for osteoporosis. You may have this done starting at age 58.  Mammogram. This may be done every 1-2 years. Talk to your health care provider about how often you should have regular mammograms. Talk with your health care provider about your test results, treatment options, and if necessary, the need for more tests. Vaccines  Your health care provider may recommend certain vaccines, such as:  Influenza vaccine. This is recommended every year.  Tetanus, diphtheria,  and acellular pertussis (Tdap, Td) vaccine. You may need a Td booster every 10 years.  Zoster vaccine. You may need this after age 6.  Pneumococcal 13-valent conjugate (PCV13) vaccine. One dose is recommended after age 45.  Pneumococcal polysaccharide (PPSV23)  vaccine. One dose is recommended after age 22. Talk to your health care provider about which screenings and vaccines you need and how often you need them. This information is not intended to replace advice given to you by your health care provider. Make sure you discuss any questions you have with your health care provider. Document Released: 05/20/2015 Document Revised: 01/11/2016 Document Reviewed: 02/22/2015 Elsevier Interactive Patient Education  2017 [place] Prevention in the Home Falls can cause injuries. They can happen to people of all ages. There are many things you can do to make your home safe and to help prevent falls. What can I do on the outside of my home?  Regularly fix the edges of walkways and driveways and fix any cracks.  Remove anything that might make you trip as you walk through a door, such as a raised step or threshold.  Trim any bushes or trees on the path to your home.  Use bright outdoor lighting.  Clear any walking paths of anything that might make someone trip, such as rocks or tools.  Regularly check to see if handrails are loose or broken. Make sure that both sides of any steps have handrails.  Any raised decks and porches should have guardrails on the edges.  Have any leaves, snow, or ice cleared regularly.  Use sand or salt on walking paths during winter.  Clean up any spills in your garage right away. This includes oil or grease spills. What can I do in the bathroom?  Use night lights.  Install grab bars by the toilet and in the tub and shower. Do not use towel bars as grab bars.  Use non-skid mats or decals in the tub or shower.  If you need to sit down in the shower, use a plastic, non-slip stool.  Keep the floor dry. Clean up any water that spills on the floor as soon as it happens.  Remove soap buildup in the tub or shower regularly.  Attach bath mats securely with double-sided non-slip rug tape.  Do not have throw rugs  and other things on the floor that can make you trip. What can I do in the bedroom?  Use night lights.  Make sure that you have a light by your bed that is easy to reach.  Do not use any sheets or blankets that are too big for your bed. They should not hang down onto the floor.  Have a firm chair that has side arms. You can use this for support while you get dressed.  Do not have throw rugs and other things on the floor that can make you trip. What can I do in the kitchen?  Clean up any spills right away.  Avoid walking on wet floors.  Keep items that you use a lot in easy-to-reach places.  If you need to reach something above you, use a strong step stool that has a grab bar.  Keep electrical cords out of the way.  Do not use floor polish or wax that makes floors slippery. If you must use wax, use non-skid floor wax.  Do not have throw rugs and other things on the floor that can make you trip. What can I do with my  stairs?  Do not leave any items on the stairs.  Make sure that there are handrails on both sides of the stairs and use them. Fix handrails that are broken or loose. Make sure that handrails are as long as the stairways.  Check any carpeting to make sure that it is firmly attached to the stairs. Fix any carpet that is loose or worn.  Avoid having throw rugs at the top or bottom of the stairs. If you do have throw rugs, attach them to the floor with carpet tape.  Make sure that you have a light switch at the top of the stairs and the bottom of the stairs. If you do not have them, ask someone to add them for you. What else can I do to help prevent falls?  Wear shoes that:  Do not have high heels.  Have rubber bottoms.  Are comfortable and fit you well.  Are closed at the toe. Do not wear sandals.  If you use a stepladder:  Make sure that it is fully opened. Do not climb a closed stepladder.  Make sure that both sides of the stepladder are locked into  place.  Ask someone to hold it for you, if possible.  Clearly mark and make sure that you can see:  Any grab bars or handrails.  First and last steps.  Where the edge of each step is.  Use tools that help you move around (mobility aids) if they are needed. These include:  Canes.  Walkers.  Scooters.  Crutches.  Turn on the lights when you go into a dark area. Replace any light bulbs as soon as they burn out.  Set up your furniture so you have a clear path. Avoid moving your furniture around.  If any of your floors are uneven, fix them.  If there are any pets around you, be aware of where they are.  Review your medicines with your doctor. Some medicines can make you feel dizzy. This can increase your chance of falling. Ask your doctor what other things that you can do to help prevent falls. This information is not intended to replace advice given to you by your health care provider. Make sure you discuss any questions you have with your health care provider. Document Released: 02/17/2009 Document Revised: 09/29/2015 Document Reviewed: 05/28/2014 Elsevier Interactive Patient Education  2017 [employer].

## 2018-09-19 ENCOUNTER — Encounter: Payer: Self-pay | Admitting: Nurse Practitioner

## 2018-09-19 ENCOUNTER — Encounter: Payer: Medicare Other | Admitting: Nurse Practitioner

## 2018-10-15 DIAGNOSIS — Z1211 Encounter for screening for malignant neoplasm of colon: Secondary | ICD-10-CM | POA: Diagnosis not present

## 2018-10-15 LAB — COLOGUARD: Cologuard: NEGATIVE

## 2018-10-22 ENCOUNTER — Encounter: Payer: Self-pay | Admitting: *Deleted

## 2018-10-23 DIAGNOSIS — H524 Presbyopia: Secondary | ICD-10-CM | POA: Diagnosis not present

## 2018-10-23 DIAGNOSIS — H25011 Cortical age-related cataract, right eye: Secondary | ICD-10-CM | POA: Diagnosis not present

## 2018-10-23 DIAGNOSIS — H5213 Myopia, bilateral: Secondary | ICD-10-CM | POA: Diagnosis not present

## 2018-10-23 DIAGNOSIS — H35342 Macular cyst, hole, or pseudohole, left eye: Secondary | ICD-10-CM | POA: Diagnosis not present

## 2018-10-23 DIAGNOSIS — H2511 Age-related nuclear cataract, right eye: Secondary | ICD-10-CM | POA: Diagnosis not present

## 2018-10-29 ENCOUNTER — Other Ambulatory Visit: Payer: Self-pay | Admitting: Nurse Practitioner

## 2018-10-29 DIAGNOSIS — L409 Psoriasis, unspecified: Secondary | ICD-10-CM

## 2018-10-29 NOTE — Telephone Encounter (Signed)
Requested medication not on list

## 2018-10-31 ENCOUNTER — Other Ambulatory Visit: Payer: Self-pay

## 2018-10-31 ENCOUNTER — Encounter: Payer: Self-pay | Admitting: Nurse Practitioner

## 2018-10-31 ENCOUNTER — Ambulatory Visit (INDEPENDENT_AMBULATORY_CARE_PROVIDER_SITE_OTHER): Payer: Medicare Other | Admitting: Nurse Practitioner

## 2018-10-31 VITALS — BP 140/80 | HR 73 | Temp 98.3°F | Ht <= 58 in | Wt 129.0 lb

## 2018-10-31 DIAGNOSIS — L2481 Irritant contact dermatitis due to metals: Secondary | ICD-10-CM

## 2018-10-31 DIAGNOSIS — D649 Anemia, unspecified: Secondary | ICD-10-CM

## 2018-10-31 DIAGNOSIS — E034 Atrophy of thyroid (acquired): Secondary | ICD-10-CM

## 2018-10-31 DIAGNOSIS — R7303 Prediabetes: Secondary | ICD-10-CM

## 2018-10-31 DIAGNOSIS — K219 Gastro-esophageal reflux disease without esophagitis: Secondary | ICD-10-CM | POA: Diagnosis not present

## 2018-10-31 DIAGNOSIS — E785 Hyperlipidemia, unspecified: Secondary | ICD-10-CM | POA: Diagnosis not present

## 2018-10-31 DIAGNOSIS — M810 Age-related osteoporosis without current pathological fracture: Secondary | ICD-10-CM | POA: Diagnosis not present

## 2018-10-31 MED ORDER — ZOSTER VAC RECOMB ADJUVANTED 50 MCG/0.5ML IM SUSR: 0.5000 mL | Freq: Once | INTRAMUSCULAR | 1 refills | Status: AC

## 2018-10-31 MED ORDER — FERROUS SULFATE 325 (65 FE) MG PO TABS: 325.0000 mg | ORAL_TABLET | Freq: Every day | ORAL | 1 refills | Status: DC

## 2018-10-31 MED ORDER — SIMVASTATIN 20 MG PO TABS: ORAL_TABLET | ORAL | 1 refills | Status: DC

## 2018-10-31 MED ORDER — OMEPRAZOLE 20 MG PO CPDR: DELAYED_RELEASE_CAPSULE | ORAL | 1 refills | Status: DC

## 2018-10-31 MED ORDER — LEVOTHYROXINE SODIUM 50 MCG PO TABS: 50.0000 ug | ORAL_TABLET | Freq: Every day | ORAL | 1 refills | Status: DC

## 2018-10-31 NOTE — Patient Instructions (Signed)
To use cream such as eucerin or cereva (cream without smell) can use hydrocortisone cream to itchy area as needed on finger. Avoid putting on ring when having symptoms.  Get These Tests  Blood Pressure- Have your blood pressure checked by your healthcare provider at least once a year.  Normal blood pressure is 120/80. Goal for most <140/90.  Weight- Have your body mass index (BMI) calculated to screen for obesity.  BMI is a measure of body fat based on height and weight.  You can calculate your own BMI at [employer]  Cholesterol- Have your cholesterol checked every year.  Diabetes- Have your blood sugar checked every year if you have high blood pressure, high cholesterol, a family history of diabetes or if you are overweight.  Pap Test - Have a pap test every 1 to 5 years if you have been sexually active.  If you are older than 65 and recent pap tests have been normal you may not need additional pap tests.  In addition, if you have had a hysterectomy  for benign disease additional pap tests are not necessary.  Mammogram-Yearly mammograms are essential for early detection of breast cancer  Screening for Colon Cancer- Colonoscopy starting at age 75. Screening may begin sooner depending on your family history and other health conditions.  Follow up colonoscopy as directed by your Gastroenterologist.  Screening for Osteoporosis- Screening begins at age 68 with bone density scanning, sooner if you are at higher risk for developing Osteoporosis.   Get these medicines  Calcium with Vitamin D- Your body requires 1200-1500 mg of Calcium a day and [phone] IU of Vitamin D a day.  You can only absorb 500 mg of Calcium at a time therefore Calcium must be taken in 2 or 3 separate doses throughout the day.  Hormones- Hormone therapy has been associated with increased risk for certain cancers and heart disease.  Talk to your healthcare provider about if you need relief from menopausal  symptoms.  Aspirin- Ask your healthcare provider about taking Aspirin to prevent Heart Disease and Stroke.   Get these Immuniztions  Flu shot- Every fall  Pneumonia shot- Once after the age of 13; if you are younger ask your healthcare provider if you need a pneumonia shot.  Tetanus- Every ten years.  Shingrix- Once after the age of 51 to prevent shingles.   Take these steps  Don't smoke- Your healthcare provider can help you quit. For tips on how to quit, ask your healthcare provider or go to www.smokefree.gov or call 1-800 QUIT-NOW.  Be physically active- Exercise 5 days a week for a minimum of 30 minutes.  If you are not already physically active, start slow and gradually work up to 30 minutes of moderate physical activity.  Try walking, dancing, bike riding, swimming, etc.  Eat a healthy diet- Eat a variety of healthy foods such as fruits, vegetables, whole grains, low fat milk, low fat cheeses, yogurt, lean meats, chicken, fish, eggs, dried beans, tofu, etc.  For more information go to www.thenutritionsource.org  Dental visit- Brush and floss teeth twice daily; visit your dentist twice a year.  Eye exam- Visit your Optometrist or Ophthalmologist yearly.  Drink alcohol in moderation- Limit alcohol intake to one drink or less a day.  Never drink and drive.  Depression- Your emotional health is as important as your physical health.  If you're feeling down or losing interest in things you normally enjoy, please talk to your healthcare provider.  Seat Belts- can save  your life; always wear one  Smoke/Carbon Monoxide detectors- These detectors need to be installed on the appropriate level of your home.  Replace batteries at least once a year.  Violence- If anyone is threatening or hurting you, please tell your healthcare provider.  Living Will/ Health care power of attorney- Discuss with your healthcare provider and family.

## 2018-10-31 NOTE — Progress Notes (Signed)
Provider: Sharon SellerEubanks, Spyros Winch K, NP  Patient Care Team: Sharon SellerEubanks, Torria Fromer K, NP as PCP - General (Geriatric Medicine)  Extended Emergency Contact Information Primary Emergency Contact: [facility],[place]ub[place] Address: [address]          [place], [place] [address] [place] Home Phone: [phone] Mobile Phone: [phone] Relation: Spouse Secondary Emergency Contact: Wiens,Raj Address: [address]          [place], [place] [address] [place] Home Phone: [phone] Mobile Phone: [phone] Relation: Son Allergies  Allergen Reactions  . Other Itching and Swelling    Patient had an allergy to an eye drop, she does not know the name of the drop.  . Prednisolone Swelling   Code Status: FULL Goals of Care: Advanced Directive information Advanced Directives 09/16/2018  Does Patient Have a Medical Advance Directive? No  Does patient want to make changes to medical advance directive? -  Would patient like information on creating a medical advance directive? No - Guardian declined     Chief Complaint  Patient presents with  . Annual Exam    Yearly physical   . Hand Problem    Examine left hand   . URI    Patient spitting up mucous x 2 days, no resolved. FYI  . Immunizations    Shingrix rx sent to pharmacy   . Medication Refill    Refill all medications @ [facility] x 90 days   . Best Practice Recommendations    Discuss need for Hep C screening     HPI: Patient is a 73 y.o. female seen in today for an annual wellness exam.    Diet? none  Exercise? Walks 30 -45 daily   Dentition: following with dentist as recommended   Ophthalmology appt: yearly  Routine specialist: ophthalmologist, GYN- yearly   GERD - controlled on omeprazole.   Cervical CIN I - following with GYN- no recurrence. 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   Hypothyroidism -  TSH slightly  elevated on levothyroxine 50 mcg daily . No CP, palpitations, fatigue, SOB, change in bowel habits. TSH 6.51  Hyperlipidemia - borderline  on simvastatin. No myalgias. LDL 134. She takes ASA daily. Has changed diet.   hx right knee OA s/p TKR 2013- plans to follow up with Ortho regard this, pain to left knee at this time..   Osteoporosis - on prolia injection q346mos. Taking cal and vit d. Last DXA 01/2017 T score -2.2 in L spine and -1.7 in femur  Hx anemia - stable on ferrous sulfate. Hgb 12.4  Hyperglycemia- A1c 5.9, she has cut back on sugar and sweets. And has increase physical activity   [facility] 10/31/2018 09/16/2018 08/27/2017 07/23/2016 05/09/2016  Decreased Interest 0 0 0 0 0  Down, Depressed, Hopeless 0 0 0 0 0  PHQ - 2 Score 0 0 0 0 0    Fall Risk  10/31/2018 09/16/2018 02/26/2018 02/05/2018 01/23/2018  Falls in the past year? 0 - No No No  Number falls in past yr: 0 0 - - -  Injury with Fall? 0 0 - - -   MMSE - Mini Mental State Exam 08/27/2017 05/09/2016 11/19/2014  Orientation to time 4 5 4   Orientation to Place 5 5 5   Registration 3 3 3   Attention/ Calculation 5 5 5   Recall 3 3 3   Language- name 2 objects 2 2  2  Language- repeat Language- follow 3 step command Language- read & follow direction Write a sentence Copy design Total score Health Maintenance  Topic Date Due  . Hepatitis C Screening  09/16/2019 (Originally 28-Apr-1946)  . INFLUENZA VACCINE  12/06/2018  . MAMMOGRAM  04/02/2019  . TETANUS/TDAP  03/27/2021  . Fecal DNA (Cologuard)  10/14/2021  . DEXA SCAN  Completed  . PNA vac Low Risk Adult  Completed    Past Medical History:  Diagnosis Date  . Acute bronchiolitis due to other infectious organisms   . Acute bronchiolitis due to respiratory syncytial virus (RSV)   . Arthritis    OA AND PAIN RIGHT KNEE  . Degeneration of intervertebral disc, site unspecified   . Disorder of bone and  cartilage, unspecified   . Disturbance of skin sensation   . Encounter for long-term (current) use of other medications   . GERD (gastroesophageal reflux disease)    PT STATES SEVERE REFLUX IF SHE DOES NOT TAKE HER OMEPRAZOLE  . Headache(784.0)    WHEN REFLUX SEVERE  . Hypothyroidism   . Long term (current) use of anticoagulants   . Osteoarthrosis, unspecified whether generalized or localized, lower leg   . Osteoporosis, unspecified   . Other and unspecified hyperlipidemia   . Other atopic dermatitis and related conditions   . Other malaise and fatigue   . Other specified pre-operative examination 11/07/2011  . Thoracic or lumbosacral neuritis or radiculitis, unspecified   . Unspecified vitamin D deficiency   . Vegetarian diet     Past Surgical History:  Procedure Laterality Date  . CERVICAL BIOPSY  W/ LOOP ELECTRODE EXCISION    . EYE SURGERY  2016   Left eye, Cataract Removal  . TEETH EXTRACTIONS--NO OTHER SURGERY    . TOTAL KNEE ARTHROPLASTY  11/28/2011   Procedure: TOTAL KNEE ARTHROPLASTY;  Surgeon: Jacki Cones, MD;  Location: [facility];  Service: Orthopedics;  Laterality: Right;  . TUBAL LIGATION      Social History   Socioeconomic History  . Marital status: Married    Spouse name: Not on file  . Number of children: Not on file  . Years of education: Not on file  . Highest education level: Not on file  Occupational History  . Not on file  Social Needs  . Financial resource strain: Not hard at all  . Food insecurity    Worry: Never true    Inability: Never true  . Transportation needs    Medical: No    Non-medical: No  Tobacco Use  . Smoking status: Never Smoker  . Smokeless tobacco: Never Used  Substance and Sexual Activity  . Alcohol use: No  . Drug use: No  . Sexual activity: Yes    Birth control/protection: Post-menopausal  Lifestyle  . Physical activity    Days per week: 3 days    Minutes per session: 30 min  . Stress: Not at all  Relationships   . Social connections    Talks on phone: More than three times a week    Gets together: More than three times a week    Attends religious service: Never    Active member of club or organization: No    Attends meetings of clubs or organizations: Never    Relationship status: Married  Other Topics Concern  . Not  on file  Social History Narrative  . Not on file    Family History  Problem Relation Age of Onset  . Alport syndrome Brother   . Breast cancer Neg Hx     Review of Systems:  Review of Systems  Constitutional: Negative for chills, fever, malaise/fatigue and weight loss.  HENT: Negative for congestion and tinnitus.   Respiratory: Negative for cough, sputum production and shortness of breath.   Cardiovascular: Negative for chest pain, palpitations and leg swelling.  Gastrointestinal: Negative for abdominal pain, constipation, diarrhea and heartburn.  Genitourinary: Negative for dysuria, frequency and urgency.  Musculoskeletal: Negative for back pain, falls, joint pain and myalgias.  Skin: Negative.   Neurological: Negative for dizziness and headaches.  Psychiatric/Behavioral: Negative for depression and memory loss. The patient does not have insomnia.      Allergies as of 10/31/2018      Reactions   Other Itching, Swelling   Patient had an allergy to an eye drop, she does not know the name of the drop.   Prednisolone Swelling      Medication List       Accurate as of October 31, 2018 10:17 AM. If you have any questions, ask your nurse or doctor.        aspirin 81 MG tablet Take 81 mg by mouth daily.   calcium carbonate 600 MG Tabs tablet Commonly known as: OS-CAL Take 600 mg by mouth 2 (two) times daily with a meal.   denosumab 60 MG/ML Sosy injection Commonly known as: PROLIA Inject 60 mg into the skin every 6 (six) months.   ferrous sulfate 325 (65 FE) MG tablet Take 1 tablet (325 mg total) by mouth daily with breakfast.   levothyroxine 50 MCG tablet  Commonly known as: SYNTHROID Take 1 tablet (50 mcg total) by mouth daily.   omeprazole 20 MG capsule Commonly known as: PRILOSEC TAKE ONE CAPSULE BY MOUTH ONCE DAILY TO REDUCE  STOMACH ACID   simvastatin 20 MG tablet Commonly known as: ZOCOR TAKE ONE TABLET BY MOUTH ONCE DAILY FOR CHOLESTEROL   Zoster Vaccine Adjuvanted injection Commonly known as: SHINGRIX Inject 0.5 mLs into the muscle once.         Physical Exam: Vitals:   10/31/18 0939  BP: 140/80  Pulse: 73  Temp: 98.3 F (36.8 C)  TempSrc: Oral  SpO2: 96%  Weight: 129 lb (58.5 kg)  Height: 4\' 9"  (1.448 m)   Body mass index is 27.92 kg/m. Wt Readings from Last 3 Encounters:  10/31/18 129 lb (58.5 kg)  02/28/18 130 lb (59 kg)  02/26/18 130 lb (59 kg)    Physical Exam Constitutional:      Appearance: Normal appearance. She is well-developed.  HENT:     Mouth/Throat:     Pharynx: No oropharyngeal exudate.  Eyes:     General: No scleral icterus.    Pupils: Pupils are equal, round, and reactive to light.  Neck:     Musculoskeletal: Neck supple.     Thyroid: No thyromegaly.     Vascular: No carotid bruit.     Trachea: No tracheal deviation.  Cardiovascular:     Rate and Rhythm: Normal rate and regular rhythm.     Heart sounds: Normal heart sounds. No murmur. No friction rub. No gallop.   Pulmonary:     Effort: Pulmonary effort is normal. No respiratory distress.     Breath sounds: Normal breath sounds. No stridor. No wheezing or rales.  Abdominal:  General: Bowel sounds are normal. There is no distension.     Palpations: Abdomen is soft. Abdomen is not rigid. There is no hepatomegaly or mass.     Tenderness: There is no abdominal tenderness. There is no guarding or rebound.     Hernia: No hernia is present.  Lymphadenopathy:     Cervical: No cervical adenopathy.  Skin:    General: Skin is warm and dry.     Findings: Rash (faint eczematous rash on ring on fingers L hand) present.  Neurological:      Mental Status: She is alert and oriented to person, place, and time.     Gait: Gait normal.     Deep Tendon Reflexes: Reflexes are normal and symmetric.  Psychiatric:        Behavior: Behavior normal.        Thought Content: Thought content normal.        Judgment: Judgment normal.     Labs reviewed: Basic Metabolic Panel: Recent Labs    02/21/18 0830 04/09/18 0839 09/08/18 0841  NA 139  --  138  K 4.4  --  4.3  CL 104  --  106  CO2 26  --  26  GLUCOSE 109*  --  106*  BUN 9  --  8  CREATININE 0.61  --  0.54*  CALCIUM 9.4  --  9.0  TSH 6.17* 2.32 6.51*   Liver Function Tests: Recent Labs    02/21/18 0830 09/08/18 0841  AST  --  22  ALT 26 24  BILITOT  --  0.5  PROT  --  6.9   No results for input(s): LIPASE, AMYLASE in the last 8760 hours. No results for input(s): AMMONIA in the last 8760 hours. CBC: Recent Labs    09/08/18 0841  WBC 7.6  NEUTROABS 3,899  HGB 12.4  HCT 36.7  MCV 92.2  PLT 281   Lipid Panel: Recent Labs    02/21/18 0830 09/08/18 0841  CHOL 206* 212*  HDL 55 54  LDLCALC 126* 134*  TRIG 136 127  CHOLHDL 3.7 3.9   Lab Results  Component Value Date   HGBA1C 5.9 (H) 09/08/2018    Procedures: No results found.  Assessment/Plan 1. Anemia, unspecified type -stable, continues on iron supplement - ferrous sulfate 325 (65 FE) MG tablet; Take 1 tablet (325 mg total) by mouth daily with breakfast.  Dispense: 90 tablet; Refill: 1  2. Gastroesophageal reflux disease, esophagitis presence not specified Stable continues on omeprazole and dietary modifications.  - omeprazole (PRILOSEC) 20 MG capsule; TAKE ONE CAPSULE BY MOUTH ONCE DAILY TO REDUCE  STOMACH ACID  Dispense: 90 capsule; Refill: 1  3. Hyperlipidemia LDL goal <100 -LDL not at goal. Discussed increase in simvastatin but pt would like to do dietary modifications first.  Will follow up lipids prior to next visit. - simvastatin (ZOCOR) 20 MG tablet; TAKE ONE TABLET BY MOUTH ONCE  DAILY FOR CHOLESTEROL  Dispense: 90 tablet; Refill: 1  4. Hypothyroidism due to acquired atrophy of thyroid -TSH slightly elevated, will continue synthroid 50 mcg and follow up TSH prior to next OV - levothyroxine (SYNTHROID) 50 MCG tablet; Take 1 tablet (50 mcg total) by mouth daily.  Dispense: 90 tablet; Refill: 1  5. Age-related osteoporosis without current pathological fracture Continues on prolia every 6 months with cal and vit d and weight bearing activity  6. Prediabetes -A1c worse, has increased physical activity and working on cutting back on sweet and carbohydrates.  7. Irritant contact dermatitis due to metals -around ring finger, she has removed ring. To use hydrocortisone cream as needed of itching and to use Eucerin cream twice daily   Next appt: 6 months with lab work prior.  Carlos American. [place], [facility] [phone]

## 2018-12-02 ENCOUNTER — Telehealth: Payer: Self-pay | Admitting: *Deleted

## 2018-12-02 MED ORDER — ZOSTER VAC RECOMB ADJUVANTED 50 MCG/0.5ML IM SUSR: 0.5000 mL | Freq: Once | INTRAMUSCULAR | 0 refills | Status: AC

## 2018-12-02 NOTE — Telephone Encounter (Signed)
Patient husband called and wanted to know if you would order a Shingles Vaccination for patient to get at the pharmacy. Stated that the insurance nurse recommended. Please Advise.

## 2018-12-02 NOTE — Telephone Encounter (Signed)
Okay to send order to pharmacy  

## 2018-12-02 NOTE — Telephone Encounter (Signed)
Rx sent to pharmacy   

## 2018-12-22 DIAGNOSIS — L02213 Cutaneous abscess of chest wall: Secondary | ICD-10-CM | POA: Diagnosis not present

## 2018-12-25 ENCOUNTER — Emergency Department (HOSPITAL_COMMUNITY)
Admission: EM | Admit: 2018-12-25 | Discharge: 2018-12-25 | Disposition: A | Discharge status: Home / Self Care | Payer: Medicare Other | Attending: Emergency Medicine | Admitting: Emergency Medicine

## 2018-12-25 ENCOUNTER — Encounter (HOSPITAL_COMMUNITY): Payer: Self-pay | Admitting: Emergency Medicine

## 2018-12-25 ENCOUNTER — Other Ambulatory Visit: Payer: Self-pay

## 2018-12-25 DIAGNOSIS — E039 Hypothyroidism, unspecified: Secondary | ICD-10-CM | POA: Diagnosis not present

## 2018-12-25 DIAGNOSIS — L02413 Cutaneous abscess of right upper limb: Secondary | ICD-10-CM | POA: Insufficient documentation

## 2018-12-25 DIAGNOSIS — Z4800 Encounter for change or removal of nonsurgical wound dressing: Secondary | ICD-10-CM | POA: Diagnosis not present

## 2018-12-25 DIAGNOSIS — Z96651 Presence of right artificial knee joint: Secondary | ICD-10-CM | POA: Diagnosis not present

## 2018-12-25 DIAGNOSIS — L02213 Cutaneous abscess of chest wall: Secondary | ICD-10-CM | POA: Diagnosis not present

## 2018-12-25 DIAGNOSIS — Z5189 Encounter for other specified aftercare: Secondary | ICD-10-CM

## 2018-12-25 DIAGNOSIS — Z79899 Other long term (current) drug therapy: Secondary | ICD-10-CM | POA: Diagnosis not present

## 2018-12-25 DIAGNOSIS — Z7982 Long term (current) use of aspirin: Secondary | ICD-10-CM | POA: Diagnosis not present

## 2018-12-25 NOTE — ED Notes (Signed)
Patient requesting her daughter. Patient could not remember the telephone number. Patient's son Merrie Roof is going to call the daughter.  Daughter's phone #- [phone]

## 2018-12-25 NOTE — Discharge Instructions (Addendum)
Return here at once for fever, drainage, or any other problems.  Call your doctor today to schedule a visit for wound check and packing removal

## 2018-12-25 NOTE — ED Triage Notes (Signed)
Pt was seen at [facility] for abscess on right upper chest and was told to go to ED if symptoms arent any better. Tried using Hindi interpretor but wrong dialect, pt speaks little [place].

## 2018-12-25 NOTE — ED Provider Notes (Signed)
[facility] DEPT Provider Note   CSN: [id number] Arrival date & time: 12/25/18  1155     History   Chief Complaint Chief Complaint  Patient presents with  . Wound Check    HPI [name] [name] is a 73 y.o. female.     73 year old female here for a wound check of an abscess that was drained today.  No fever or chills.  No vomiting or diarrhea.  Abscess was packed with iodoform gauze afterwards.  She has been placed on Bactrim.  No other complaints     Past Medical History:  Diagnosis Date  . Acute bronchiolitis due to other infectious organisms   . Acute bronchiolitis due to respiratory syncytial virus (RSV)   . Arthritis    OA AND PAIN RIGHT KNEE  . Degeneration of intervertebral disc, site unspecified   . Disorder of bone and cartilage, unspecified   . Disturbance of skin sensation   . Encounter for long-term (current) use of other medications   . GERD (gastroesophageal reflux disease)    PT STATES SEVERE REFLUX IF SHE DOES NOT TAKE HER OMEPRAZOLE  . Headache(784.0)    WHEN REFLUX SEVERE  . Hypothyroidism   . Long term (current) use of anticoagulants   . Osteoarthrosis, unspecified whether generalized or localized, lower leg   . Osteoporosis, unspecified   . Other and unspecified hyperlipidemia   . Other atopic dermatitis and related conditions   . Other malaise and fatigue   . Other specified pre-operative examination 11/07/2011  . Thoracic or lumbosacral neuritis or radiculitis, unspecified   . Unspecified vitamin D deficiency   . Vegetarian diet     Patient Active Problem List   Diagnosis Date Noted  . Estrogen deficiency 12/14/2016  . Anemia 12/14/2016  . Vaginal dysplasia 09/20/2016  . Muscle spasm of left lower extremity 05/11/2016  . Swelling of joint of left knee 05/11/2016  . Dysplasia of cervix, low grade (CIN 1) 05/11/2016  . High risk medication use 05/11/2016  . Thyroid activity decreased 11/19/2014  . Pain,  joint, multiple sites 11/19/2014  . Numbness and tingling of left leg 04/06/2014  . Routine general medical examination at a health care facility 09/29/2013  . Osteoporosis, post-menopausal 04/21/2013  . Cough variant asthma 03/17/2013  . Psoriasis 03/17/2013  . Persistent dry cough 03/17/2013  . Osteoarthritis of left knee 03/03/2013  . Psoriasis-like skin disease 03/03/2013  . Eczema 01/07/2013  . Need for prophylactic vaccination and inoculation against influenza 01/07/2013  . Prediabetes 12/17/2012  . Cough 12/17/2012  . Reactive airway disease with wheezing 12/03/2012  . URI, acute 12/03/2012  . Hypothyroidism 10/29/2012  . Unspecified vitamin D deficiency 10/29/2012  . Hyperlipidemia LDL goal <130 10/29/2012  . GERD (gastroesophageal reflux disease) 10/29/2012  . Osteoporosis 10/29/2012  . Myalgia and myositis 10/29/2012    Past Surgical History:  Procedure Laterality Date  . CERVICAL BIOPSY  W/ LOOP ELECTRODE EXCISION    . EYE SURGERY  2016   Left eye, Cataract Removal  . TEETH EXTRACTIONS--NO OTHER SURGERY    . TOTAL KNEE ARTHROPLASTY  11/28/2011   Procedure: TOTAL KNEE ARTHROPLASTY;  Surgeon: Tobi Bastos, MD;  Location: [facility];  Service: Orthopedics;  Laterality: Right;  . TUBAL LIGATION       OB History    Gravida  3   Para  3   Term  3   Preterm      AB      Living  3  SAB      TAB      Ectopic      Multiple      Live Births  3            Home Medications    Prior to Admission medications   Medication Sig Start Date End Date Taking? Authorizing Provider  aspirin 81 MG tablet Take 81 mg by mouth daily.    [provider]  calcium carbonate (OS-CAL) 600 MG TABS Take 600 mg by mouth 2 (two) times daily with a meal.     [provider]  denosumab (PROLIA) 60 MG/ML SOSY injection Inject 60 mg into the skin every 6 (six) months. 08/04/18   Sharon SellerEubanks, Jessica K, NP  ferrous sulfate 325 (65 FE) MG tablet Take 1 tablet  (325 mg total) by mouth daily with breakfast. 10/31/18   Sharon SellerEubanks, Jessica K, NP  levothyroxine (SYNTHROID) 50 MCG tablet Take 1 tablet (50 mcg total) by mouth daily. 10/31/18   Sharon SellerEubanks, Jessica K, NP  omeprazole (PRILOSEC) 20 MG capsule TAKE ONE CAPSULE BY MOUTH ONCE DAILY TO REDUCE  STOMACH ACID 10/31/18   Sharon SellerEubanks, Jessica K, NP  simvastatin (ZOCOR) 20 MG tablet TAKE ONE TABLET BY MOUTH ONCE DAILY FOR CHOLESTEROL 10/31/18   Sharon SellerEubanks, Jessica K, NP  Zoster Vaccine Live, PF, (ZOSTAVAX) [id number] UNT/0.65ML injection Inject 0.65 mLs into the skin once.    [provider]    Family History Family History  Problem Relation Age of Onset  . Alport syndrome Brother   . Breast cancer Neg Hx     Social History Social History   Tobacco Use  . Smoking status: Never Smoker  . Smokeless tobacco: Never Used  Substance Use Topics  . Alcohol use: No  . Drug use: No     Allergies   Other and Prednisolone   Review of Systems Review of Systems  All other systems reviewed and are negative.    Physical Exam Updated Vital Signs BP (!) 170/84   Pulse 98   Temp 98.6 F (37 C) (Oral)   Resp 18   SpO2 100%   Physical Exam Vitals signs and nursing note reviewed.  Constitutional:      General: She is not in acute distress.    Appearance: Normal appearance. She is well-developed. She is not toxic-appearing.  HENT:     Head: Normocephalic and atraumatic.  Eyes:     General: Lids are normal.     Conjunctiva/sclera: Conjunctivae normal.     Pupils: Pupils are equal, round, and reactive to light.  Neck:     Musculoskeletal: Normal range of motion and neck supple.     Thyroid: No thyroid mass.     Trachea: No tracheal deviation.  Cardiovascular:     Rate and Rhythm: Normal rate and regular rhythm.     Heart sounds: Normal heart sounds. No murmur. No gallop.   Pulmonary:     Effort: Pulmonary effort is normal. No respiratory distress.     Breath sounds: Normal breath sounds. No stridor.  No decreased breath sounds, wheezing, rhonchi or rales.  Abdominal:     General: Bowel sounds are normal. There is no distension.     Palpations: Abdomen is soft.     Tenderness: There is no abdominal tenderness. There is no rebound.  Musculoskeletal: Normal range of motion.        General: No tenderness.       Arms:  Skin:    General: Skin  is warm and dry.     Findings: No abrasion or rash.  Neurological:     Mental Status: She is alert and oriented to person, place, and time.     GCS: GCS eye subscore is 4. GCS verbal subscore is 5. GCS motor subscore is 6.     Cranial Nerves: No cranial nerve deficit.     Sensory: No sensory deficit.  Psychiatric:        Speech: Speech normal.        Behavior: Behavior normal.      ED Treatments / Results  Labs (all labs ordered are listed, but only abnormal results are displayed) Labs Reviewed - No data to display  EKG None  Radiology No results found.  Procedures Procedures (including critical care time)  Medications Ordered in ED Medications - No data to display   Initial Impression / Assessment and Plan / ED Course  I have reviewed the triage vital signs and the nursing notes.  Pertinent labs & imaging results that were available during my care of the patient were reviewed by me and considered in my medical decision making (see chart for details).        Patient's wound has been drained and packed appropriately.  Return precautions given  Final Clinical Impressions(s) / ED Diagnoses   Final diagnoses:  None    ED Discharge Orders    None       Lorre NickAllen, Alysah Carton, MD 12/25/18 1341

## 2018-12-26 DIAGNOSIS — M1712 Unilateral primary osteoarthritis, left knee: Secondary | ICD-10-CM | POA: Diagnosis not present

## 2018-12-26 DIAGNOSIS — Z96651 Presence of right artificial knee joint: Secondary | ICD-10-CM | POA: Insufficient documentation

## 2018-12-26 DIAGNOSIS — M1990 Unspecified osteoarthritis, unspecified site: Secondary | ICD-10-CM | POA: Insufficient documentation

## 2018-12-26 DIAGNOSIS — Z471 Aftercare following joint replacement surgery: Secondary | ICD-10-CM | POA: Diagnosis not present

## 2018-12-29 ENCOUNTER — Ambulatory Visit (INDEPENDENT_AMBULATORY_CARE_PROVIDER_SITE_OTHER): Payer: Medicare Other | Admitting: Nurse Practitioner

## 2018-12-29 ENCOUNTER — Other Ambulatory Visit: Payer: Self-pay

## 2018-12-29 ENCOUNTER — Other Ambulatory Visit: Payer: Self-pay | Admitting: *Deleted

## 2018-12-29 ENCOUNTER — Encounter: Payer: Self-pay | Admitting: Nurse Practitioner

## 2018-12-29 VITALS — BP 138/80 | HR 85 | Temp 97.7°F | Ht <= 58 in | Wt 129.0 lb

## 2018-12-29 DIAGNOSIS — L02213 Cutaneous abscess of chest wall: Secondary | ICD-10-CM

## 2018-12-29 MED ORDER — SULFAMETHOXAZOLE-TRIMETHOPRIM 800-160 MG PO TABS: 1.0000 | ORAL_TABLET | Freq: Two times a day (BID) | ORAL | 0 refills | Status: DC

## 2018-12-29 NOTE — Telephone Encounter (Signed)
This encounter was created in error - please disregard.

## 2018-12-29 NOTE — Progress Notes (Signed)
Careteam: Patient Care Team: Sharon SellerEubanks, Cheila Wickstrom K, NP as PCP - General (Geriatric Medicine) Romualdo BolkJertson, Jill Evelyn, MD as Consulting Physician (Obstetrics and Gynecology) Alejandro MullingMatthews, Lisa D, RN as [facility] Care Management  Advanced Directive information    Allergies  Allergen Reactions  . Other Itching and Swelling    Patient had an allergy to an eye drop, she does not know the name of the drop.  . Prednisolone Swelling    Chief Complaint  Patient presents with  . Follow-up    ED follow-up from 12/25/2018 for an abcess on chest. Here with daughter,   . Immunizations    Discuss if ok to get flu vaccine, patient is on ABX for infection   . Results    Discuss cologuard from June, patient not informed of results     HPI: Patient is a 73 y.o. female seen in the office today to follow up from abscess on chest wall. Last week she went to urgent care and had and I&D of right chest wall cyst with abscess. She was placed on bactrim and has 3 days left. She went to the ED on 8/20 for dressing change. Overall redness, pain and tenderness has improved. No fever.  Culture results faxed from urgent care- moderate growth of staphylococcus aureus noted, sensitive to to sulfa    Review of Systems:  Review of Systems  Constitutional: Negative for chills, fever and weight loss.  Respiratory: Negative for cough and shortness of breath.   Cardiovascular: Negative for chest pain and leg swelling.  Gastrointestinal: Negative for constipation, diarrhea, nausea and vomiting.  Skin: Negative for itching and rash.    Past Medical History:  Diagnosis Date  . Acute bronchiolitis due to other infectious organisms   . Acute bronchiolitis due to respiratory syncytial virus (RSV)   . Arthritis    OA AND PAIN RIGHT KNEE  . Degeneration of intervertebral disc, site unspecified   . Disorder of bone and cartilage, unspecified   . Disturbance of skin sensation   . Encounter for long-term  (current) use of other medications   . GERD (gastroesophageal reflux disease)    PT STATES SEVERE REFLUX IF SHE DOES NOT TAKE HER OMEPRAZOLE  . Headache(784.0)    WHEN REFLUX SEVERE  . Hypothyroidism   . Long term (current) use of anticoagulants   . Osteoarthrosis, unspecified whether generalized or localized, lower leg   . Osteoporosis, unspecified   . Other and unspecified hyperlipidemia   . Other atopic dermatitis and related conditions   . Other malaise and fatigue   . Other specified pre-operative examination 11/07/2011  . Thoracic or lumbosacral neuritis or radiculitis, unspecified   . Unspecified vitamin D deficiency   . Vegetarian diet    Past Surgical History:  Procedure Laterality Date  . CERVICAL BIOPSY  W/ LOOP ELECTRODE EXCISION    . EYE SURGERY  2016   Left eye, Cataract Removal  . TEETH EXTRACTIONS--NO OTHER SURGERY    . TOTAL KNEE ARTHROPLASTY  11/28/2011   Procedure: TOTAL KNEE ARTHROPLASTY;  Surgeon: Jacki Conesonald A Gioffre, MD;  Location: [facility];  Service: Orthopedics;  Laterality: Right;  . TUBAL LIGATION     Social History:   reports that she has never smoked. She has never used smokeless tobacco. She reports that she does not drink alcohol or use drugs.  Family History  Problem Relation Age of Onset  . Alport syndrome Brother   . Breast cancer Neg Hx     Medications:  Patient's Medications  New Prescriptions   No medications on file  Previous Medications   ASPIRIN 81 MG TABLET    Take 81 mg by mouth daily.   CALCIUM CARBONATE (OS-CAL) 600 MG TABS    Take 600 mg by mouth 2 (two) times daily with a meal.    DENOSUMAB (PROLIA) 60 MG/ML SOSY INJECTION    Inject 60 mg into the skin every 6 (six) months.   FERROUS SULFATE 325 (65 FE) MG TABLET    Take 1 tablet (325 mg total) by mouth daily with breakfast.   LEVOTHYROXINE (SYNTHROID) 50 MCG TABLET    Take 1 tablet (50 mcg total) by mouth daily.   OMEPRAZOLE (PRILOSEC) 20 MG CAPSULE    TAKE ONE CAPSULE BY MOUTH ONCE  DAILY TO REDUCE  STOMACH ACID   SIMVASTATIN (ZOCOR) 20 MG TABLET    TAKE ONE TABLET BY MOUTH ONCE DAILY FOR CHOLESTEROL   SULFAMETHOXAZOLE-TRIMETHOPRIM (BACTRIM DS) 800-160 MG TABLET    Take 1 tablet by mouth 2 (two) times daily.   ZOSTER VACCINE LIVE, PF, (ZOSTAVAX) 9604519400 UNT/0.65ML INJECTION    Inject 0.65 mLs into the skin once.  Modified Medications   No medications on file  Discontinued Medications   IBUPROFEN (ADVIL) 200 MG TABLET    Take 200 mg by mouth as needed.    Physical Exam:  Vitals:   12/29/18 1031  BP: 138/80  Pulse: 85  Temp: 97.7 F (36.5 C)  TempSrc: Oral  SpO2: 97%  Weight: 129 lb (58.5 kg)  Height: 4\' 9"  (1.448 m)   Body mass index is 27.92 kg/m. Wt Readings from Last 3 Encounters:  12/29/18 129 lb (58.5 kg)  10/31/18 129 lb (58.5 kg)  02/28/18 130 lb (59 kg)    Physical Exam Constitutional:      Appearance: Normal appearance. She is well-developed.  Cardiovascular:     Rate and Rhythm: Normal rate and regular rhythm.     Heart sounds: Normal heart sounds. No murmur. No friction rub. No gallop.   Pulmonary:     Effort: Pulmonary effort is normal. No respiratory distress.     Breath sounds: Normal breath sounds. No stridor. No wheezing or rales.  Abdominal:     General: Bowel sounds are normal. There is no distension.     Palpations: Abdomen is soft. Abdomen is not rigid. There is no hepatomegaly or mass.     Tenderness: There is no abdominal tenderness. There is no guarding or rebound.     Hernia: No hernia is present.  Lymphadenopathy:     Cervical: No cervical adenopathy.  Skin:    General: Skin is warm and dry.     Comments: Circumferential abscess with small amount of purulent drainage noted to packing with odor; to right chest.   Neurological:     Mental Status: She is alert and oriented to person, place, and time.     Gait: Gait normal.     Deep Tendon Reflexes: Reflexes are normal and symmetric.  Psychiatric:        Behavior: Behavior  normal.        Thought Content: Thought content normal.        Judgment: Judgment normal.    Labs reviewed: Basic Metabolic Panel: Recent Labs    02/21/18 0830 04/09/18 0839 09/08/18 0841  NA 139  --  138  K 4.4  --  4.3  CL 104  --  106  CO2 26  --  26  GLUCOSE 109*  --  106*  BUN 9  --  8  CREATININE 0.61  --  0.54*  CALCIUM 9.4  --  9.0  TSH 6.17* 2.32 6.51*   Liver Function Tests: Recent Labs    02/21/18 0830 09/08/18 0841  AST  --  22  ALT 26 24  BILITOT  --  0.5  PROT  --  6.9   No results for input(s): LIPASE, AMYLASE in the last 8760 hours. No results for input(s): AMMONIA in the last 8760 hours. CBC: Recent Labs    09/08/18 0841  WBC 7.6  NEUTROABS 3,899  HGB 12.4  HCT 36.7  MCV 92.2  PLT 281   Lipid Panel: Recent Labs    02/21/18 0830 09/08/18 0841  CHOL 206* 212*  HDL 55 54  LDLCALC 126* 134*  TRIG 136 127  CHOLHDL 3.7 3.9   TSH: Recent Labs    02/21/18 0830 04/09/18 0839 09/08/18 0841  TSH 6.17* 2.32 6.51*   A1C: Lab Results  Component Value Date   HGBA1C 5.9 (H) 09/08/2018     Assessment/Plan 1. Cutaneous abscess of chest wall Patient's wound has been cleaned and packed appropriately. -education provided to daughter for dressing changes (to be done every 2 days) will send out home health nursing for dressing changes - Return precautions given.  -will extended antibiotics for 2 weeks total.  - sulfamethoxazole-trimethoprim (BACTRIM DS) 800-160 MG tablet; Take 1 tablet by mouth 2 (two) times daily.  Dispense: 8 tablet; Refill: 0 - Ambulatory referral to [place] for dressing change.   Next appt: 1 week.  Carlos American. [place], [facility] [phone]

## 2018-12-29 NOTE — Patient Outreach (Addendum)
[facility]) Care Management  12/29/2018  CAREY LAFON 01/17/1946 [id number]    Referral Received 12/29/2018 Initial Outreach 12/29/2018  Interpreter # [id number] Davonna Belling used for HINDI   RN spoke with pt today and introduced [facility] services and the purpose for today's call. Identifiers verified as RN discussed her reported issues. Both the pt and her spouse on the line and declined all services when presented. RN verified [facility] involved with dressing changes with the referred [place] agency from the provider's office today. Again pt admante indicating no needs and refused services. RN offered to send [facility] information that was declined and did not authorize for any information to be sent to her at this time. Offered to provide a contact via [facility] if services are needed in the future. Pt declined to be mailed information and did not wish to take the contact number for future services. Made pt aware that her provider (case closure letter) would be notified of her decline for [facility] services. No further calls or inquires will be made at this time.   Raina Mina, RN Care Management Coordinator [place] Office [phone]

## 2018-12-29 NOTE — Patient Instructions (Signed)
Continue bactrim (take with food) for a total of 2 weeks.  To change dressing to wound every 2 days.  Home health nursing consulted for help with dressing changes.   Follow up in office for 1 week.

## 2018-12-31 ENCOUNTER — Telehealth: Payer: Self-pay

## 2018-12-31 NOTE — Telephone Encounter (Signed)
Called patient to follow up and see if they were able to reach [facility]. Spoke with patient's husband and he stated that they were unable to get any answers from [facility]. Told them I would contact [facility] to inquire further.  Called and spoke with [facility]. They informed me that they had spoken with family member and would not be able to assist patient at this time. They did not have enough staffing. Consulted with provider and referral coordinator here at office. Referral coordinator has place a new referral for this patient. Provider wanted patient to come in to office on Friday to have wound looked at and offered to change dressing then.   Called patient to inform them of information with Jessica's recommendations, cancellation of [facility] due to staffing, and new referral placed by referral coordinator. Patient's husband verbalized understanding. Scheduled future appointment for patient. Patient's husband states he will contact his daughter and tell her to contact us regarding dressing. Awaiting phone call from daughter at this time.

## 2018-12-31 NOTE — Telephone Encounter (Signed)
Patient's daughter called and stated they were contacted by [facility] and informed a nurse would come out day and change dressing for patient. She states no one has showed up. Informed daughter referral was placed with [facility] and provided contact number. Informed patient to contact them to see why nurse did not show up. Routing to provider for further review.  Please advise.

## 2018-12-31 NOTE — Telephone Encounter (Signed)
Sounds good, if they are unable to get in touch with anyone to let us know and we can attempt to reach out as well.

## 2019-01-01 NOTE — Telephone Encounter (Signed)
Patient's husband called to confirm tomorrow's appointment with Roseanne Reno. Patient's daughter did not call back. Will follow up with family at tomorrow's appointment.

## 2019-01-01 NOTE — Telephone Encounter (Signed)
Thanks for update, hopefully daughter was able to change the dressing yesterday

## 2019-01-02 ENCOUNTER — Other Ambulatory Visit: Payer: Self-pay

## 2019-01-02 ENCOUNTER — Encounter: Payer: Self-pay | Admitting: Nurse Practitioner

## 2019-01-02 ENCOUNTER — Ambulatory Visit (INDEPENDENT_AMBULATORY_CARE_PROVIDER_SITE_OTHER): Payer: Medicare Other | Admitting: Nurse Practitioner

## 2019-01-02 VITALS — BP 138/74 | HR 73 | Temp 98.1°F | Ht <= 58 in | Wt 129.0 lb

## 2019-01-02 DIAGNOSIS — L02213 Cutaneous abscess of chest wall: Secondary | ICD-10-CM

## 2019-01-02 NOTE — Progress Notes (Signed)
Careteam: Patient Care Team: Sharon SellerEubanks, Merced Hanners K, NP as PCP - General (Geriatric Medicine) Romualdo BolkJertson, Jill Evelyn, MD as Consulting Physician (Obstetrics and Gynecology)  Advanced Directive information    Allergies  Allergen Reactions  . Other Itching and Swelling    Patient had an allergy to an eye drop, she does not know the name of the drop.  . Prednisolone Swelling    Chief Complaint  Patient presents with  . Follow-up    Wound follow-up and dressing change. Accompanied by husband.     HPI: Patient is a 73 y.o. female seen in the office today for dressing change. Home health was unable to come out and do dressing change therefore they requested to come to office for change today.  Daughter was able to change dressing 2 days ago.  She continues on bactrim twice daily for total of 2 weeks- confirmed at pharmacy she has 2 week supply.  No fever, increase redness or drainage.   Review of Systems:  Review of Systems  Constitutional: Negative for chills, fever and weight loss.  Respiratory: Negative for cough and shortness of breath.   Cardiovascular: Negative for chest pain and leg swelling.  Gastrointestinal: Negative for constipation, diarrhea, nausea and vomiting.  Skin: Negative for itching and rash.       Wound to right shoulder    Past Medical History:  Diagnosis Date  . Acute bronchiolitis due to other infectious organisms   . Acute bronchiolitis due to respiratory syncytial virus (RSV)   . Arthritis    OA AND PAIN RIGHT KNEE  . Degeneration of intervertebral disc, site unspecified   . Disorder of bone and cartilage, unspecified   . Disturbance of skin sensation   . Encounter for long-term (current) use of other medications   . GERD (gastroesophageal reflux disease)    PT STATES SEVERE REFLUX IF SHE DOES NOT TAKE HER OMEPRAZOLE  . Headache(784.0)    WHEN REFLUX SEVERE  . Hypothyroidism   . Long term (current) use of anticoagulants   . Osteoarthrosis,  unspecified whether generalized or localized, lower leg   . Osteoporosis, unspecified   . Other and unspecified hyperlipidemia   . Other atopic dermatitis and related conditions   . Other malaise and fatigue   . Other specified pre-operative examination 11/07/2011  . Thoracic or lumbosacral neuritis or radiculitis, unspecified   . Unspecified vitamin D deficiency   . Vegetarian diet    Past Surgical History:  Procedure Laterality Date  . CERVICAL BIOPSY  W/ LOOP ELECTRODE EXCISION    . EYE SURGERY  2016   Left eye, Cataract Removal  . TEETH EXTRACTIONS--NO OTHER SURGERY    . TOTAL KNEE ARTHROPLASTY  11/28/2011   Procedure: TOTAL KNEE ARTHROPLASTY;  Surgeon: Jacki Conesonald A Gioffre, MD;  Location: [facility];  Service: Orthopedics;  Laterality: Right;  . TUBAL LIGATION     Social History:   reports that she has never smoked. She has never used smokeless tobacco. She reports that she does not drink alcohol or use drugs.  Family History  Problem Relation Age of Onset  . Alport syndrome Brother   . Breast cancer Neg Hx     Medications: Patient's Medications  New Prescriptions   No medications on file  Previous Medications   ASPIRIN 81 MG TABLET    Take 81 mg by mouth daily.   CALCIUM CARBONATE (OS-CAL) 600 MG TABS    Take 600 mg by mouth 2 (two) times daily with a meal.  DENOSUMAB (PROLIA) 60 MG/ML SOSY INJECTION    Inject 60 mg into the skin every 6 (six) months.   FERROUS SULFATE 325 (65 FE) MG TABLET    Take 1 tablet (325 mg total) by mouth daily with breakfast.   LEVOTHYROXINE (SYNTHROID) 50 MCG TABLET    Take 1 tablet (50 mcg total) by mouth daily.   OMEPRAZOLE (PRILOSEC) 20 MG CAPSULE    TAKE ONE CAPSULE BY MOUTH ONCE DAILY TO REDUCE  STOMACH ACID   SIMVASTATIN (ZOCOR) 20 MG TABLET    TAKE ONE TABLET BY MOUTH ONCE DAILY FOR CHOLESTEROL   SULFAMETHOXAZOLE-TRIMETHOPRIM (BACTRIM DS) 800-160 MG TABLET    Take 1 tablet by mouth 2 (two) times daily.   ZOSTER VACCINE LIVE, PF, (ZOSTAVAX)  19400 UNT/0.65ML INJECTION    Inject 0.65 mLs into the skin once.  Modified Medications   No medications on file  Discontinued Medications   No medications on file    Physical Exam:  Vitals:   01/02/19 1114  BP: 138/74  Pulse: 73  Temp: 98.1 F (36.7 C)  TempSrc: Oral  SpO2: 98%  Weight: 129 lb (58.5 kg)  Height: 4\' 9"  (1.448 m)   Body mass index is 27.92 kg/m. Wt Readings from Last 3 Encounters:  01/02/19 129 lb (58.5 kg)  12/29/18 129 lb (58.5 kg)  10/31/18 129 lb (58.5 kg)    Physical Exam Constitutional:      Appearance: Normal appearance. She is well-developed.  Abdominal:     Palpations: Abdomen is not rigid.  Lymphadenopathy:     Cervical: No cervical adenopathy.  Skin:    General: Skin is warm and dry.     Comments: Circumferential would with small amount of purulent drainage without odor to right chest.   Neurological:     Mental Status: She is alert and oriented to person, place, and time.     Labs reviewed: Basic Metabolic Panel: Recent Labs    02/21/18 0830 04/09/18 0839 09/08/18 0841  NA 139  --  138  K 4.4  --  4.3  CL 104  --  106  CO2 26  --  26  GLUCOSE 109*  --  106*  BUN 9  --  8  CREATININE 0.61  --  0.54*  CALCIUM 9.4  --  9.0  TSH 6.17* 2.32 6.51*   Liver Function Tests: Recent Labs    02/21/18 0830 09/08/18 0841  AST  --  22  ALT 26 24  BILITOT  --  0.5  PROT  --  6.9   No results for input(s): LIPASE, AMYLASE in the last 8760 hours. No results for input(s): AMMONIA in the last 8760 hours. CBC: Recent Labs    09/08/18 0841  WBC 7.6  NEUTROABS 3,899  HGB 12.4  HCT 36.7  MCV 92.2  PLT 281   Lipid Panel: Recent Labs    02/21/18 0830 09/08/18 0841  CHOL 206* 212*  HDL 55 54  LDLCALC 126* 134*  TRIG 136 127  CHOLHDL 3.7 3.9   TSH: Recent Labs    02/21/18 0830 04/09/18 0839 09/08/18 0841  TSH 6.17* 2.32 6.51*   A1C: Lab Results  Component Value Date   HGBA1C 5.9 (H) 09/08/2018      Assessment/Plan 1. Cutaneous abscess of chest wall Healing well. Dressing changed in office (cleaned and packed appropriately) since home health nursing was unable to be set up due to staffing issues. Pts husband at visit but daughter had been advised on how  to change dressing and pt and husband reports she felt comfortable doing this.  -will have family change dressing daily over the weekend and follow up in office again on 8/31 -to continue bactrim for total of 14 days.   Next appt: 01/05/2019 Carlos American. [place], [facility] [phone]

## 2019-01-02 NOTE — Patient Instructions (Signed)
To change dressing daily (to clean with saline then iodine swab and place packing strip- cover with 4 x 4 gauze) over the weekend and follow up on Monday

## 2019-01-05 ENCOUNTER — Ambulatory Visit (INDEPENDENT_AMBULATORY_CARE_PROVIDER_SITE_OTHER): Payer: Medicare Other | Admitting: Nurse Practitioner

## 2019-01-05 ENCOUNTER — Encounter: Payer: Self-pay | Admitting: Nurse Practitioner

## 2019-01-05 ENCOUNTER — Telehealth: Payer: Self-pay | Admitting: *Deleted

## 2019-01-05 ENCOUNTER — Other Ambulatory Visit: Payer: Self-pay | Admitting: Nurse Practitioner

## 2019-01-05 ENCOUNTER — Other Ambulatory Visit: Payer: Self-pay

## 2019-01-05 VITALS — BP 122/74 | HR 87 | Temp 98.1°F | Ht <= 58 in | Wt 129.0 lb

## 2019-01-05 DIAGNOSIS — L02213 Cutaneous abscess of chest wall: Secondary | ICD-10-CM | POA: Diagnosis not present

## 2019-01-05 DIAGNOSIS — M549 Dorsalgia, unspecified: Secondary | ICD-10-CM | POA: Diagnosis not present

## 2019-01-05 NOTE — Patient Instructions (Signed)
To use meplix border to wound Change every 3 days-- next dressing change on Thursday   Can get supplies at medical supply store such as [facility] [address] [place], [place] [address]  To follow up in 2 weeks for wound check.

## 2019-01-05 NOTE — Progress Notes (Signed)
Careteam: Patient Care Team: Sharon SellerEubanks, Andrewjames Weirauch K, NP as PCP - General (Geriatric Medicine) Romualdo BolkJertson, Jill Evelyn, MD as Consulting Physician (Obstetrics and Gynecology)  Advanced Directive information    Allergies  Allergen Reactions  . Other Itching and Swelling    Patient had an allergy to an eye drop, she does not know the name of the drop.  . Prednisolone Swelling    Chief Complaint  Patient presents with  . Wound Check    4 day wound follow-up. Here with husband   . Shoulder Problem    Right side shoulder pain, onset last night   . Orders    Requesting wound dressing supply order to be sent to local pharmacy to see if covered    HPI: Patient is a 73 y.o. female seen in the office today to have dressing changed, daughter changed dressing over the weekend.  Doing well. Still some tenderness to touch. No increase in heat or drainage. Husband has questions over antibiotics.  States she is having upper back tenderness due to only sleeping on one side. Mild tenderness.   Review of Systems:  Review of Systems  Constitutional: Negative for chills and fever.  Musculoskeletal: Positive for back pain and myalgias. Negative for falls, joint pain and neck pain.       Tenderness to right upper back  Skin: Negative for itching and rash.       Ongoing wound.    Past Medical History:  Diagnosis Date  . Acute bronchiolitis due to other infectious organisms   . Acute bronchiolitis due to respiratory syncytial virus (RSV)   . Arthritis    OA AND PAIN RIGHT KNEE  . Degeneration of intervertebral disc, site unspecified   . Disorder of bone and cartilage, unspecified   . Disturbance of skin sensation   . Encounter for long-term (current) use of other medications   . GERD (gastroesophageal reflux disease)    PT STATES SEVERE REFLUX IF SHE DOES NOT TAKE HER OMEPRAZOLE  . Headache(784.0)    WHEN REFLUX SEVERE  . Hypothyroidism   . Long term (current) use of anticoagulants   .  Osteoarthrosis, unspecified whether generalized or localized, lower leg   . Osteoporosis, unspecified   . Other and unspecified hyperlipidemia   . Other atopic dermatitis and related conditions   . Other malaise and fatigue   . Other specified pre-operative examination 11/07/2011  . Thoracic or lumbosacral neuritis or radiculitis, unspecified   . Unspecified vitamin D deficiency   . Vegetarian diet    Past Surgical History:  Procedure Laterality Date  . CERVICAL BIOPSY  W/ LOOP ELECTRODE EXCISION    . EYE SURGERY  2016   Left eye, Cataract Removal  . TEETH EXTRACTIONS--NO OTHER SURGERY    . TOTAL KNEE ARTHROPLASTY  11/28/2011   Procedure: TOTAL KNEE ARTHROPLASTY;  Surgeon: Jacki Conesonald A Gioffre, MD;  Location: [facility];  Service: Orthopedics;  Laterality: Right;  . TUBAL LIGATION     Social History:   reports that she has never smoked. She has never used smokeless tobacco. She reports that she does not drink alcohol or use drugs.  Family History  Problem Relation Age of Onset  . Alport syndrome Brother   . Breast cancer Neg Hx     Medications: Patient's Medications  New Prescriptions   No medications on file  Previous Medications   ASPIRIN 81 MG TABLET    Take 81 mg by mouth daily.   CALCIUM CARBONATE (OS-CAL) 600 MG  TABS    Take 600 mg by mouth 2 (two) times daily with a meal.    DENOSUMAB (PROLIA) 60 MG/ML SOSY INJECTION    Inject 60 mg into the skin every 6 (six) months.   FERROUS SULFATE 325 (65 FE) MG TABLET    Take 1 tablet (325 mg total) by mouth daily with breakfast.   LEVOTHYROXINE (SYNTHROID) 50 MCG TABLET    Take 1 tablet (50 mcg total) by mouth daily.   OMEPRAZOLE (PRILOSEC) 20 MG CAPSULE    TAKE ONE CAPSULE BY MOUTH ONCE DAILY TO REDUCE  STOMACH ACID   SIMVASTATIN (ZOCOR) 20 MG TABLET    TAKE ONE TABLET BY MOUTH ONCE DAILY FOR CHOLESTEROL   SULFAMETHOXAZOLE-TRIMETHOPRIM (BACTRIM DS) 800-160 MG TABLET    Take 1 tablet by mouth 2 (two) times daily.   ZOSTER VACCINE LIVE,  PF, (ZOSTAVAX) 40981 UNT/0.65ML INJECTION    Inject 0.65 mLs into the skin once.  Modified Medications   No medications on file  Discontinued Medications   No medications on file    Physical Exam:  Vitals:   01/05/19 0859  BP: 122/74  Pulse: 87  Temp: 98.1 F (36.7 C)  TempSrc: Oral  SpO2: 96%  Weight: 129 lb (58.5 kg)  Height: 4\' 9"  (1.448 m)   Body mass index is 27.92 kg/m. Wt Readings from Last 3 Encounters:  01/05/19 129 lb (58.5 kg)  01/02/19 129 lb (58.5 kg)  12/29/18 129 lb (58.5 kg)    Physical Exam Constitutional:      Appearance: Normal appearance. She is well-developed.  Abdominal:     Palpations: Abdomen is not rigid.  Musculoskeletal:     Cervical back: She exhibits tenderness.       Back:  Lymphadenopathy:     Cervical: No cervical adenopathy.  Skin:    General: Skin is warm and dry.     Comments: Circumferential wound 1.5 X 2.5, healing well. No drainage at this time, red base.   Neurological:     Mental Status: She is alert and oriented to person, place, and time.     Labs reviewed: Basic Metabolic Panel: Recent Labs    02/21/18 0830 04/09/18 0839 09/08/18 0841  NA 139  --  138  K 4.4  --  4.3  CL 104  --  106  CO2 26  --  26  GLUCOSE 109*  --  106*  BUN 9  --  8  CREATININE 0.61  --  0.54*  CALCIUM 9.4  --  9.0  TSH 6.17* 2.32 6.51*   Liver Function Tests: Recent Labs    02/21/18 0830 09/08/18 0841  AST  --  22  ALT 26 24  BILITOT  --  0.5  PROT  --  6.9   No results for input(s): LIPASE, AMYLASE in the last 8760 hours. No results for input(s): AMMONIA in the last 8760 hours. CBC: Recent Labs    09/08/18 0841  WBC 7.6  NEUTROABS 3,899  HGB 12.4  HCT 36.7  MCV 92.2  PLT 281   Lipid Panel: Recent Labs    02/21/18 0830 09/08/18 0841  CHOL 206* 212*  HDL 55 54  LDLCALC 126* 134*  TRIG 136 127  CHOLHDL 3.7 3.9   TSH: Recent Labs    02/21/18 0830 04/09/18 0839 09/08/18 0841  TSH 6.17* 2.32 6.51*    A1C: Lab Results  Component Value Date   HGBA1C 5.9 (H) 09/08/2018     Assessment/Plan 1. Cutaneous abscess  of chest wall Healing well. No signs of infection in or around wound currently. Will start using meplix with borders, to keep dressing dry and change ever 3 days. To notify if purulent drainage occurs, redness to wound, heat or wound starts to get bigger. To complete 2 weeks of antibiotics as prescribed.   2. Tenderness of back Due to only sleeping on one side, encouraged to change positions at night, can use heating pad- 2-3 times daily, stretches. To notify if pain worsens or fails to improved.   Next appt: 2 weeks for wound check sooner if needed Hakan Nudelman K. Biagio Borg  [facility] [phone]

## 2019-01-05 NOTE — Telephone Encounter (Signed)
Patient husband called and stated that you had told them that wife needed to take her antibiotic for 2 weeks. Stated that she needs ONE more week sent to the pharmacy. Picked up the medication on 8/24 for #8 but not enough. (called and confirmed with pharmacy, spoke with [place]) Please Advise.

## 2019-01-05 NOTE — Telephone Encounter (Signed)
They already received a 10 day supply from pharmacy by the Urgent Care, we confirmed this at their first appt prior to me sending it #8 tablets to equal a 14 day supply

## 2019-01-06 ENCOUNTER — Encounter: Payer: Self-pay | Admitting: Nurse Practitioner

## 2019-01-06 NOTE — Telephone Encounter (Signed)
Breeding notified.

## 2019-01-21 ENCOUNTER — Other Ambulatory Visit: Payer: Self-pay

## 2019-01-21 ENCOUNTER — Encounter: Payer: Self-pay | Admitting: Nurse Practitioner

## 2019-01-21 ENCOUNTER — Ambulatory Visit (INDEPENDENT_AMBULATORY_CARE_PROVIDER_SITE_OTHER): Payer: Medicare Other | Admitting: Nurse Practitioner

## 2019-01-21 VITALS — BP 124/76 | HR 73 | Temp 97.9°F | Ht <= 58 in | Wt 130.2 lb

## 2019-01-21 DIAGNOSIS — Z23 Encounter for immunization: Secondary | ICD-10-CM

## 2019-01-21 DIAGNOSIS — L309 Dermatitis, unspecified: Secondary | ICD-10-CM

## 2019-01-21 DIAGNOSIS — L02213 Cutaneous abscess of chest wall: Secondary | ICD-10-CM

## 2019-01-21 MED ORDER — BETAMETHASONE DIPROPIONATE 0.05 % EX CREA: TOPICAL_CREAM | Freq: Two times a day (BID) | CUTANEOUS | 0 refills | Status: DC

## 2019-01-21 NOTE — Progress Notes (Signed)
Careteam: Patient Care Team: [name], NP as PCP - General (Geriatric Medicine) Romualdo Bolk, MD as Consulting Physician (Obstetrics and Gynecology)  Advanced Directive information    Allergies  Allergen Reactions  . Other Itching and Swelling    Patient had an allergy to an eye drop, she does not know the name of the drop.  . Prednisolone Swelling    Chief Complaint  Patient presents with  . Follow-up    Followup of wound     HPI: Patient is a 73 y.o. female seen in the office today for follow up on right upper chest wound due to do cutaneous abscess of chest wall. She completed antibiotic. At last visit was instructed to use  mepilex and to change every 3 days. Unable to get a home health nurse due to staffing issues. Wound has completed healed. Reports mild tenderness that continues to improve. Not using mepilex for the last 2 days.   Rash between fingers that she has used betamethasone dipropionate cream for and was effective. No adverse reaction with medication.   Review of Systems:  Review of Systems  Constitutional: Negative for chills, fever and weight loss.  Respiratory: Negative for cough and shortness of breath.   Cardiovascular: Negative for chest pain and leg swelling.  Gastrointestinal: Negative for constipation, diarrhea, nausea and vomiting.  Skin: Positive for itching and rash.       Wound to right shoulder that has healed   Itchy rash to between fingers    Past Medical History:  Diagnosis Date  . Acute bronchiolitis due to other infectious organisms   . Acute bronchiolitis due to respiratory syncytial virus (RSV)   . Arthritis    OA AND PAIN RIGHT KNEE  . Degeneration of intervertebral disc, site unspecified   . Disorder of bone and cartilage, unspecified   . Disturbance of skin sensation   . Encounter for long-term (current) use of other medications   . GERD (gastroesophageal reflux disease)    PT STATES SEVERE REFLUX IF SHE  DOES NOT TAKE HER OMEPRAZOLE  . Headache(784.0)    WHEN REFLUX SEVERE  . Hypothyroidism   . Long term (current) use of anticoagulants   . Osteoarthrosis, unspecified whether generalized or localized, lower leg   . Osteoporosis, unspecified   . Other and unspecified hyperlipidemia   . Other atopic dermatitis and related conditions   . Other malaise and fatigue   . Other specified pre-operative examination 11/07/2011  . Thoracic or lumbosacral neuritis or radiculitis, unspecified   . Unspecified vitamin D deficiency   . Vegetarian diet    Past Surgical History:  Procedure Laterality Date  . CERVICAL BIOPSY  W/ LOOP ELECTRODE EXCISION    . EYE SURGERY  2016   Left eye, Cataract Removal  . TEETH EXTRACTIONS--NO OTHER SURGERY    . TOTAL KNEE ARTHROPLASTY  11/28/2011   Procedure: TOTAL KNEE ARTHROPLASTY;  Surgeon: Jacki Cones, MD;  Location: [facility];  Service: Orthopedics;  Laterality: Right;  . TUBAL LIGATION     Social History:   reports that she has never smoked. She has never used smokeless tobacco. She reports that she does not drink alcohol or use drugs.  Family History  Problem Relation Age of Onset  . Alport syndrome Brother   . Breast cancer Neg Hx     Medications: Patient's Medications  New Prescriptions   No medications on file  Previous Medications   ASPIRIN 81 MG TABLET  Take 81 mg by mouth daily.   CALCIUM CARBONATE (OS-CAL) 600 MG TABS    Take 600 mg by mouth 2 (two) times daily with a meal.    DENOSUMAB (PROLIA) 60 MG/ML SOSY INJECTION    Inject 60 mg into the skin every 6 (six) months.   FERROUS SULFATE 325 (65 FE) MG TABLET    Take 1 tablet (325 mg total) by mouth daily with breakfast.   LEVOTHYROXINE (SYNTHROID) 50 MCG TABLET    Take 1 tablet (50 mcg total) by mouth daily.   OMEPRAZOLE (PRILOSEC) 20 MG CAPSULE    TAKE ONE CAPSULE BY MOUTH ONCE DAILY TO REDUCE  STOMACH ACID   SIMVASTATIN (ZOCOR) 20 MG TABLET    TAKE ONE TABLET BY MOUTH ONCE DAILY FOR  CHOLESTEROL   ZOSTER VACCINE LIVE, PF, (ZOSTAVAX) [id number] UNT/0.65ML INJECTION    Inject 0.65 mLs into the skin once.  Modified Medications   No medications on file  Discontinued Medications   SULFAMETHOXAZOLE-TRIMETHOPRIM (BACTRIM DS) 800-160 MG TABLET    Take 1 tablet by mouth 2 (two) times daily.    Physical Exam:  There were no vitals filed for this visit. There is no height or weight on file to calculate BMI. Wt Readings from Last 3 Encounters:  01/05/19 129 lb (58.5 kg)  01/02/19 129 lb (58.5 kg)  12/29/18 129 lb (58.5 kg)    Physical Exam Constitutional:      Appearance: Normal appearance. She is well-developed.  Abdominal:     Palpations: Abdomen is not rigid.  Lymphadenopathy:     Cervical: No cervical adenopathy.  Skin:    General: Skin is warm and dry.     Findings: Rash (itchy dark rash between fingers ) present.     Comments: Right chest wall wound has closed. No redness, warmth or swelling  Neurological:     Mental Status: She is alert and oriented to person, place, and time.     Labs reviewed: Basic Metabolic Panel: Recent Labs    02/21/18 0830 04/09/18 0839 09/08/18 0841  NA 139  --  138  K 4.4  --  4.3  CL 104  --  106  CO2 26  --  26  GLUCOSE 109*  --  106*  BUN 9  --  8  CREATININE 0.61  --  0.54*  CALCIUM 9.4  --  9.0  TSH 6.17* 2.32 6.51*   Liver Function Tests: Recent Labs    02/21/18 0830 09/08/18 0841  AST  --  22  ALT 26 24  BILITOT  --  0.5  PROT  --  6.9   No results for input(s): LIPASE, AMYLASE in the last 8760 hours. No results for input(s): AMMONIA in the last 8760 hours. CBC: Recent Labs    09/08/18 0841  WBC 7.6  NEUTROABS 3,899  HGB 12.4  HCT 36.7  MCV 92.2  PLT 281   Lipid Panel: Recent Labs    02/21/18 0830 09/08/18 0841  CHOL 206* 212*  HDL 55 54  LDLCALC 126* 134*  TRIG 136 127  CHOLHDL 3.7 3.9   TSH: Recent Labs    02/21/18 0830 04/09/18 0839 09/08/18 0841  TSH 6.17* 2.32 6.51*   A1C:  Lab Results  Component Value Date   HGBA1C 5.9 (H) 09/08/2018     Assessment/Plan 1. Need for influenza vaccination - Flu Vaccine QUAD High Dose(Fluad)  2. Cutaneous abscess of chest wall -has healed at this time. To follow up if area worsens,  opens back up, redness or swelling occurs or if tenderness worsens.  3. Eczema, unspecified type - betamethasone dipropionate (DIPROLENE) 0.05 % cream; Apply topically 2 (two) times daily.  Dispense: 30 g; Refill: 0   Next appt: 04/29/2019- as scheduled  Jessica K. [place], [facility] [phone]

## 2019-02-09 ENCOUNTER — Other Ambulatory Visit: Payer: Self-pay

## 2019-02-09 MED ORDER — DENOSUMAB 60 MG/ML ~~LOC~~ SOSY: 60.0000 mg | PREFILLED_SYRINGE | SUBCUTANEOUS | 0 refills | Status: DC

## 2019-02-16 ENCOUNTER — Ambulatory Visit: Payer: Medicare Other

## 2019-02-16 DIAGNOSIS — H35373 Puckering of macula, bilateral: Secondary | ICD-10-CM | POA: Diagnosis not present

## 2019-02-16 DIAGNOSIS — H43811 Vitreous degeneration, right eye: Secondary | ICD-10-CM | POA: Diagnosis not present

## 2019-02-16 DIAGNOSIS — H31012 Macula scars of posterior pole (postinflammatory) (post-traumatic), left eye: Secondary | ICD-10-CM | POA: Diagnosis not present

## 2019-02-17 ENCOUNTER — Other Ambulatory Visit: Payer: Self-pay

## 2019-02-17 ENCOUNTER — Ambulatory Visit (INDEPENDENT_AMBULATORY_CARE_PROVIDER_SITE_OTHER): Payer: Medicare Other

## 2019-02-17 DIAGNOSIS — M810 Age-related osteoporosis without current pathological fracture: Secondary | ICD-10-CM | POA: Diagnosis not present

## 2019-02-17 MED ORDER — DENOSUMAB 60 MG/ML ~~LOC~~ SOSY
60.0000 mg | PREFILLED_SYRINGE | Freq: Once | SUBCUTANEOUS | Status: AC
  Administered 2019-02-17: 60 mg via SUBCUTANEOUS

## 2019-02-23 ENCOUNTER — Other Ambulatory Visit: Payer: Self-pay | Admitting: Nurse Practitioner

## 2019-02-23 DIAGNOSIS — Z1231 Encounter for screening mammogram for malignant neoplasm of breast: Secondary | ICD-10-CM

## 2019-03-03 NOTE — Progress Notes (Signed)
74 y.o. G37P3003 Married Asian Not Hispanic or Latino female here for annual exam.   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  No vaginal bleeding. No bowel or bladder c/o. Not sexually active, ED.     No LMP recorded. Patient is postmenopausal.          Sexually active: No.  The current method of family planning is post menopausal status.    Exercising: Yes.    walking Smoker:  no  Health Maintenance: Pap:  02/28/2018 WNL NEG HPV, 02/07/2017 WNL NEG HPV History of abnormal Pap:  Yes, history of leep and colpo MMG: 04/01/2018 Birads 1 negative BMD:   01/25/2017, osteopenia (followed by primary MD), on prolia.  Colonoscopy: Cologuard WNL  TDaP:  2012 Gardasil: never   reports that she has never smoked. She has never used smokeless tobacco. She reports that she does not drink alcohol or use drugs. 2 sons, one daughter, 75 grandson's   Past Medical History:  Diagnosis Date  . Acute bronchiolitis due to other infectious organisms   . Acute bronchiolitis due to respiratory syncytial virus (RSV)   . Arthritis    OA AND PAIN RIGHT KNEE  . Degeneration of intervertebral disc, site unspecified   . Disorder of bone and cartilage, unspecified   . Disturbance of skin sensation   . Encounter for long-term (current) use of other medications   . GERD (gastroesophageal reflux disease)    PT STATES SEVERE REFLUX IF SHE DOES NOT TAKE HER OMEPRAZOLE  . Headache(784.0)    WHEN REFLUX SEVERE  . Hypothyroidism   . Long term (current) use of anticoagulants   . Osteoarthrosis, unspecified whether generalized or localized, lower leg   . Osteoporosis, unspecified   . Other and unspecified hyperlipidemia   . Other atopic dermatitis and related conditions   . Other malaise and fatigue   . Other specified pre-operative examination 11/07/2011  . Thoracic or lumbosacral neuritis or radiculitis, unspecified   . Unspecified vitamin D deficiency   . Vegetarian diet     Past Surgical History:   Procedure Laterality Date  . CERVICAL BIOPSY  W/ LOOP ELECTRODE EXCISION    . EYE SURGERY  2016   Left eye, Cataract Removal  . TEETH EXTRACTIONS--NO OTHER SURGERY    . TOTAL KNEE ARTHROPLASTY  11/28/2011   Procedure: TOTAL KNEE ARTHROPLASTY;  Surgeon: Tobi Bastos, MD;  Location: [facility];  Service: Orthopedics;  Laterality: Right;  . TUBAL LIGATION      Current Outpatient Medications  Medication Sig Dispense Refill  . aspirin 81 MG tablet Take 81 mg by mouth daily.    . betamethasone dipropionate (DIPROLENE) 0.05 % cream Apply topically 2 (two) times daily. 30 g 0  . calcium carbonate (OS-CAL) 600 MG TABS Take 600 mg by mouth 2 (two) times daily with a meal.     . denosumab (PROLIA) 60 MG/ML SOSY injection Inject 60 mg into the skin every 6 (six) months. 1 mL 0  . ferrous sulfate 325 (65 FE) MG tablet Take 1 tablet (325 mg total) by mouth daily with breakfast. 90 tablet 1  . levothyroxine (SYNTHROID) 50 MCG tablet Take 1 tablet (50 mcg total) by mouth daily. 90 tablet 1  . omeprazole (PRILOSEC) 20 MG capsule TAKE ONE CAPSULE BY MOUTH ONCE DAILY TO REDUCE  STOMACH ACID 90 capsule 1  . simvastatin (ZOCOR) 20 MG tablet TAKE ONE TABLET BY MOUTH ONCE DAILY FOR CHOLESTEROL 90 tablet 1  . Zoster  Vaccine Live, PF, (ZOSTAVAX) 96222 UNT/0.65ML injection Inject 0.65 mLs into the skin once.     No current facility-administered medications for this visit.     Family History  Problem Relation Age of Onset  . Alport syndrome Brother   . Breast cancer Neg Hx     Review of Systems  Constitutional: Negative.   HENT: Negative.   Eyes: Negative.   Respiratory: Negative.   Cardiovascular: Negative.   Endocrine: Negative.   Genitourinary: Negative.   Musculoskeletal: Negative.   Skin: Negative.   Allergic/Immunologic: Negative.   Neurological: Negative.   Hematological: Negative.   Psychiatric/Behavioral: Negative.     Exam:   BP 118/80 (BP Location: Right Arm, Patient Position:  Sitting, Cuff Size: Normal)   Pulse 80   Temp (!) 97.2 F (36.2 C) (Skin)   Ht 4\' 9"  (1.448 m)   Wt 129 lb 6.4 oz (58.7 kg)   BMI 28.00 kg/m   Weight change: @WEIGHTCHANGE @ Height:   Height: 4\' 9"  (144.8 cm)  Ht Readings from Last 3 Encounters:  03/05/19 4\' 9"  (1.448 m)  01/21/19 4\' 9"  (1.448 m)  01/05/19 4\' 9"  (1.448 m)    General appearance: alert, cooperative and appears stated age Head: Normocephalic, without obvious abnormality, atraumatic Neck: no adenopathy, supple, symmetrical, trachea midline and thyroid normal to inspection and palpation Lungs: clear to auscultation bilaterally Cardiovascular: regular rate and rhythm Breasts: normal appearance, no masses or tenderness Abdomen: soft, non-tender; non distended,  no masses,  no organomegaly Extremities: extremities normal, atraumatic, no cyanosis or edema Skin: Skin color, texture, turgor normal. No rashes or lesions Lymph nodes: Cervical, supraclavicular, and axillary nodes normal. No abnormal inguinal nodes palpated Neurologic: Grossly normal   Pelvic: External genitalia:  no lesions              Urethra:  normal appearing urethra with no masses, tenderness or lesions              Bartholins and Skenes: normal                 Vagina: normal appearing vagina with normal color and discharge, no lesions              Cervix: not seen, scarred above vagina               Bimanual Exam:  Uterus:  no masses or tenderness              Adnexa: no mass, fullness, tenderness               Rectovaginal: Confirms               Anus:  normal sphincter tone, no lesions  Chaperone was present for exam.  A:  Well Woman with normal exam  H/O cervical and vaginal dysplasia. S/P LEEP and 5 FU. Pap normal for the last 2 years  Vaginal atrophy, cervix not seen, scared above vagina  Osteopenia, followed by primary, on Prolia  P:   Pap with hpv  Discussed breast self exam  Discussed calcium and vit D intake  Mammogram scheduled in  12/20  DEXA with primary  Colon cancer screening UTD

## 2019-03-05 ENCOUNTER — Other Ambulatory Visit (HOSPITAL_COMMUNITY)
Admission: RE | Admit: 2019-03-05 | Discharge: 2019-03-05 | Disposition: A | Discharge status: Home / Self Care | Payer: Medicare Other | Source: Ambulatory Visit | Attending: Obstetrics and Gynecology | Admitting: Obstetrics and Gynecology

## 2019-03-05 ENCOUNTER — Other Ambulatory Visit: Payer: Self-pay

## 2019-03-05 ENCOUNTER — Encounter: Payer: Self-pay | Admitting: Obstetrics and Gynecology

## 2019-03-05 ENCOUNTER — Ambulatory Visit (INDEPENDENT_AMBULATORY_CARE_PROVIDER_SITE_OTHER): Payer: Medicare Other | Admitting: Obstetrics and Gynecology

## 2019-03-05 VITALS — BP 118/80 | HR 80 | Temp 97.2°F | Ht <= 58 in | Wt 129.4 lb

## 2019-03-05 DIAGNOSIS — Z87411 Personal history of vaginal dysplasia: Secondary | ICD-10-CM | POA: Insufficient documentation

## 2019-03-05 DIAGNOSIS — Z780 Asymptomatic menopausal state: Secondary | ICD-10-CM | POA: Diagnosis not present

## 2019-03-05 DIAGNOSIS — Z87410 Personal history of cervical dysplasia: Secondary | ICD-10-CM | POA: Insufficient documentation

## 2019-03-05 DIAGNOSIS — Z124 Encounter for screening for malignant neoplasm of cervix: Secondary | ICD-10-CM | POA: Diagnosis present

## 2019-03-05 DIAGNOSIS — Z1151 Encounter for screening for human papillomavirus (HPV): Secondary | ICD-10-CM | POA: Diagnosis not present

## 2019-03-05 DIAGNOSIS — Z1272 Encounter for screening for malignant neoplasm of vagina: Secondary | ICD-10-CM

## 2019-03-05 DIAGNOSIS — N952 Postmenopausal atrophic vaginitis: Secondary | ICD-10-CM

## 2019-03-05 DIAGNOSIS — Z01419 Encounter for gynecological examination (general) (routine) without abnormal findings: Secondary | ICD-10-CM

## 2019-03-05 NOTE — Patient Instructions (Signed)
EXERCISE AND DIET:  We recommended that you start or continue a regular exercise program for good health. Regular exercise means any activity that makes your heart beat faster and makes you sweat.  We recommend exercising at least 30 minutes per day at least 3 days a week, preferably 4 or 5.  We also recommend a diet low in fat and sugar.  Inactivity, poor dietary choices and obesity can cause diabetes, heart attack, stroke, and kidney damage, among others.    ALCOHOL AND SMOKING:  Women should limit their alcohol intake to no more than 7 drinks/beers/glasses of wine (combined, not each!) per week. Moderation of alcohol intake to this level decreases your risk of breast cancer and liver damage. And of course, no recreational drugs are part of a healthy lifestyle.  And absolutely no smoking or even second hand smoke. Most people know smoking can cause heart and lung diseases, but did you know it also contributes to weakening of your bones? Aging of your skin?  Yellowing of your teeth and nails?  CALCIUM AND VITAMIN D:  Adequate intake of calcium and Vitamin D are recommended.  The recommendations for exact amounts of these supplements seem to change often, but generally speaking 1,200 mg of calcium (between diet and supplement) and 800 units of Vitamin D per day seems prudent. Certain women may benefit from higher intake of Vitamin D.  If you are among these women, your doctor will have told you during your visit.    PAP SMEARS:  Pap smears, to check for cervical cancer or precancers,  have traditionally been done yearly, although recent scientific advances have shown that most women can have pap smears less often.  However, every woman still should have a physical exam from her gynecologist every year. It will include a breast check, inspection of the vulva and vagina to check for abnormal growths or skin changes, a visual exam of the cervix, and then an exam to evaluate the size and shape of the uterus and  ovaries.  And after 75 years of age, a rectal exam is indicated to check for rectal cancers. We will also provide age appropriate advice regarding health maintenance, like when you should have certain vaccines, screening for sexually transmitted diseases, bone density testing, colonoscopy, mammograms, etc.   MAMMOGRAMS:  All women over 40 years old should have a yearly mammogram. Many facilities now offer a "3D" mammogram, which may cost around $50 extra out of pocket. If possible,  we recommend you accept the option to have the 3D mammogram performed.  It both reduces the number of women who will be called back for extra views which then turn out to be normal, and it is better than the routine mammogram at detecting truly abnormal areas.    COLON CANCER SCREENING: Now recommend starting at age 45. At this time colonoscopy is not covered for routine screening until 50. There are take home tests that can be done between 45-49.   COLONOSCOPY:  Colonoscopy to screen for colon cancer is recommended for all women at age 50.  We know, you hate the idea of the prep.  We agree, BUT, having colon cancer and not knowing it is worse!!  Colon cancer so often starts as a polyp that can be seen and removed at colonscopy, which can quite literally save your life!  And if your first colonoscopy is normal and you have no family history of colon cancer, most women don't have to have it again for   10 years.  Once every ten years, you can do something that may end up saving your life, right?  We will be happy to help you get it scheduled when you are ready.  Be sure to check your insurance coverage so you understand how much it will cost.  It may be covered as a preventative service at no cost, but you should check your particular policy.      Breast Self-Awareness Breast self-awareness means being familiar with how your breasts look and feel. It involves checking your breasts regularly and reporting any changes to your  health care provider. Practicing breast self-awareness is important. A change in your breasts can be a sign of a serious medical problem. Being familiar with how your breasts look and feel allows you to find any problems early, when treatment is more likely to be successful. All women should practice breast self-awareness, including women who have had breast implants. How to do a breast self-exam One way to learn what is normal for your breasts and whether your breasts are changing is to do a breast self-exam. To do a breast self-exam: Look for Changes  1. Remove all the clothing above your waist. 2. Stand in front of a mirror in a room with good lighting. 3. Put your hands on your hips. 4. Push your hands firmly downward. 5. Compare your breasts in the mirror. Look for differences between them (asymmetry), such as: ? Differences in shape. ? Differences in size. ? Puckers, dips, and bumps in one breast and not the other. 6. Look at each breast for changes in your skin, such as: ? Redness. ? Scaly areas. 7. Look for changes in your nipples, such as: ? Discharge. ? Bleeding. ? Dimpling. ? Redness. ? A change in position. Feel for Changes Carefully feel your breasts for lumps and changes. It is best to do this while lying on your back on the floor and again while sitting or standing in the shower or tub with soapy water on your skin. Feel each breast in the following way:  Place the arm on the side of the breast you are examining above your head.  Feel your breast with the other hand.  Start in the nipple area and make  inch (2 cm) overlapping circles to feel your breast. Use the pads of your three middle fingers to do this. Apply light pressure, then medium pressure, then firm pressure. The light pressure will allow you to feel the tissue closest to the skin. The medium pressure will allow you to feel the tissue that is a little deeper. The firm pressure will allow you to feel the tissue  close to the ribs.  Continue the overlapping circles, moving downward over the breast until you feel your ribs below your breast.  Move one finger-width toward the center of the body. Continue to use the  inch (2 cm) overlapping circles to feel your breast as you move slowly up toward your collarbone.  Continue the up and down exam using all three pressures until you reach your armpit.  Write Down What You Find  Write down what is normal for each breast and any changes that you find. Keep a written record with breast changes or normal findings for each breast. By writing this information down, you do not need to depend only on memory for size, tenderness, or location. Write down where you are in your menstrual cycle, if you are still menstruating. If you are having trouble noticing differences   in your breasts, do not get discouraged. With time you will become more familiar with the variations in your breasts and more comfortable with the exam. How often should I examine my breasts? Examine your breasts every month. If you are breastfeeding, the best time to examine your breasts is after a feeding or after using a breast pump. If you menstruate, the best time to examine your breasts is 5-7 days after your period is over. During your period, your breasts are lumpier, and it may be more difficult to notice changes. When should I see my health care provider? See your health care provider if you notice:  A change in shape or size of your breasts or nipples.  A change in the skin of your breast or nipples, such as a reddened or scaly area.  Unusual discharge from your nipples.  A lump or thick area that was not there before.  Pain in your breasts.  Anything that concerns you.  

## 2019-03-11 LAB — CYTOLOGY - PAP
Comment: NEGATIVE
Diagnosis: REACTIVE
High risk HPV: NEGATIVE

## 2019-04-09 ENCOUNTER — Other Ambulatory Visit: Payer: Self-pay

## 2019-04-09 ENCOUNTER — Ambulatory Visit
Admission: RE | Admit: 2019-04-09 | Discharge: 2019-04-09 | Disposition: A | Discharge status: Home / Self Care | Payer: Medicare Other | Source: Ambulatory Visit | Attending: Nurse Practitioner | Admitting: Nurse Practitioner

## 2019-04-09 DIAGNOSIS — Z1231 Encounter for screening mammogram for malignant neoplasm of breast: Secondary | ICD-10-CM

## 2019-04-29 ENCOUNTER — Other Ambulatory Visit: Payer: Self-pay

## 2019-04-29 ENCOUNTER — Other Ambulatory Visit: Payer: Medicare Other

## 2019-04-29 DIAGNOSIS — E034 Atrophy of thyroid (acquired): Secondary | ICD-10-CM

## 2019-04-29 DIAGNOSIS — E785 Hyperlipidemia, unspecified: Secondary | ICD-10-CM

## 2019-04-29 DIAGNOSIS — D649 Anemia, unspecified: Secondary | ICD-10-CM

## 2019-04-29 DIAGNOSIS — R7303 Prediabetes: Secondary | ICD-10-CM

## 2019-04-30 LAB — COMPLETE METABOLIC PANEL WITH GFR
AG Ratio: 1.6 (calc) (ref 1.0–2.5)
ALT: 26 U/L (ref 6–29)
AST: 24 U/L (ref 10–35)
Albumin: 4.5 g/dL (ref 3.6–5.1)
Alkaline phosphatase (APISO): 55 U/L (ref 37–153)
BUN: 8 mg/dL (ref 7–25)
CO2: 26 mmol/L (ref 20–32)
Calcium: 9.3 mg/dL (ref 8.6–10.4)
Chloride: 103 mmol/L (ref 98–110)
Creat: 0.6 mg/dL (ref 0.60–0.93)
GFR, Est African American: 103 mL/min/{1.73_m2} (ref >=60)
GFR, Est Non African American: 89 mL/min/{1.73_m2} (ref >=60)
Globulin: 2.8 g/dL (calc) (ref 1.9–3.7)
Glucose, Bld: 106 mg/dL — ABNORMAL HIGH (ref 65–99)
Potassium: 4.4 mmol/L (ref 3.5–5.3)
Sodium: 140 mmol/L (ref 135–146)
Total Bilirubin: 0.6 mg/dL (ref 0.2–1.2)
Total Protein: 7.3 g/dL (ref 6.1–8.1)

## 2019-04-30 LAB — HEMOGLOBIN A1C
Hgb A1c MFr Bld: 5.8 % of total Hgb — ABNORMAL HIGH (ref <5.7)
Mean Plasma Glucose: 120 (calc)
eAG (mmol/L): 6.6 (calc)

## 2019-04-30 LAB — LIPID PANEL
Cholesterol: 212 mg/dL — ABNORMAL HIGH (ref <200)
HDL: 55 mg/dL (ref >=50)
LDL Cholesterol (Calc): 130 mg/dL (calc) — ABNORMAL HIGH
Non-HDL Cholesterol (Calc): 157 mg/dL (calc) — ABNORMAL HIGH (ref <130)
Total CHOL/HDL Ratio: 3.9 (calc) (ref <5.0)
Triglycerides: 159 mg/dL — ABNORMAL HIGH (ref <150)

## 2019-04-30 LAB — CBC WITH DIFFERENTIAL/PLATELET
Absolute Monocytes: 892 cells/uL (ref 200–950)
Basophils Absolute: 107 cells/uL (ref 0–200)
Basophils Relative: 1.1 %
Eosinophils Absolute: 126 cells/uL (ref 15–500)
Eosinophils Relative: 1.3 %
HCT: 39.8 % (ref 35.0–45.0)
Hemoglobin: 13.6 g/dL (ref 11.7–15.5)
Lymphs Abs: 2891 cells/uL (ref 850–3900)
MCH: 31.3 pg (ref 27.0–33.0)
MCHC: 34.2 g/dL (ref 32.0–36.0)
MCV: 91.7 fL (ref 80.0–100.0)
MPV: 9.7 fL (ref 7.5–12.5)
Monocytes Relative: 9.2 %
Neutro Abs: 5684 cells/uL (ref 1500–7800)
Neutrophils Relative %: 58.6 %
Platelets: 299 10*3/uL (ref 140–400)
RBC: 4.34 10*6/uL (ref 3.80–5.10)
RDW: 11.9 % (ref 11.0–15.0)
Total Lymphocyte: 29.8 %
WBC: 9.7 10*3/uL (ref 3.8–10.8)

## 2019-04-30 LAB — TSH: TSH: 1.89 mIU/L (ref 0.40–4.50)

## 2019-05-05 ENCOUNTER — Ambulatory Visit: Payer: Medicare Other | Admitting: Nurse Practitioner

## 2019-05-06 ENCOUNTER — Other Ambulatory Visit: Payer: Self-pay

## 2019-05-06 ENCOUNTER — Encounter: Payer: Self-pay | Admitting: Nurse Practitioner

## 2019-05-06 ENCOUNTER — Ambulatory Visit (INDEPENDENT_AMBULATORY_CARE_PROVIDER_SITE_OTHER): Payer: Medicare Other | Admitting: Nurse Practitioner

## 2019-05-06 VITALS — BP 122/72 | HR 83 | Temp 97.3°F | Ht <= 58 in | Wt 129.4 lb

## 2019-05-06 DIAGNOSIS — M810 Age-related osteoporosis without current pathological fracture: Secondary | ICD-10-CM

## 2019-05-06 DIAGNOSIS — E034 Atrophy of thyroid (acquired): Secondary | ICD-10-CM

## 2019-05-06 DIAGNOSIS — D649 Anemia, unspecified: Secondary | ICD-10-CM | POA: Diagnosis not present

## 2019-05-06 DIAGNOSIS — E785 Hyperlipidemia, unspecified: Secondary | ICD-10-CM | POA: Diagnosis not present

## 2019-05-06 DIAGNOSIS — K219 Gastro-esophageal reflux disease without esophagitis: Secondary | ICD-10-CM

## 2019-05-06 DIAGNOSIS — E2839 Other primary ovarian failure: Secondary | ICD-10-CM

## 2019-05-06 DIAGNOSIS — R7303 Prediabetes: Secondary | ICD-10-CM

## 2019-05-06 MED ORDER — CALCIUM CARBONATE 600 MG PO TABS: 600.0000 mg | ORAL_TABLET | Freq: Two times a day (BID) | ORAL | 1 refills | Status: AC

## 2019-05-06 MED ORDER — LEVOTHYROXINE SODIUM 50 MCG PO TABS: 50.0000 ug | ORAL_TABLET | Freq: Every day | ORAL | 1 refills | Status: AC

## 2019-05-06 MED ORDER — SIMVASTATIN 40 MG PO TABS: ORAL_TABLET | ORAL | 1 refills | Status: AC

## 2019-05-06 MED ORDER — OMEPRAZOLE 20 MG PO CPDR: DELAYED_RELEASE_CAPSULE | ORAL | 1 refills | Status: AC

## 2019-05-06 MED ORDER — FERROUS SULFATE 325 (65 FE) MG PO TABS: 325.0000 mg | ORAL_TABLET | Freq: Every day | ORAL | 1 refills | Status: AC

## 2019-05-06 NOTE — Patient Instructions (Addendum)
To INCREASE your simvastatin to 40 mg daily- new prescription was sent for 40 mg   To follow up in 6 months with labs befor evisit

## 2019-05-06 NOTE — Progress Notes (Signed)
Careteam: Patient Care Team: [name], NP as PCP - General (Geriatric Medicine) Romualdo Bolk, MD as Consulting Physician (Obstetrics and Gynecology)  Advanced Directive information    Allergies  Allergen Reactions  . Other Itching and Swelling    Patient had an allergy to an eye drop, she does not know the name of the drop.  . Prednisolone Swelling    Chief Complaint  Patient presents with  . Medical Management of Chronic Issues    6 month follow-up and discuss labs (copy printed and given to patient)   . Immunizations    Discuss need for shingrix vaccine   . Medication Refill    Requesting refill on all medications for 90 day supply and additional refills to [facility]      HPI: Patient is a 73 y.o. female seen in the office today for routine follow up   hypothyroid - last TSH 1.89 on synthroid 50 mcg  Hyperlipidemia- on simvastatin 20 mg - LDL 130, not at goal.    Osteoporosis- continues to get prolia, every 6 months. Continues on cal and vit d  Anemia- hgb stable, continues on iron supplement   Review of Systems:  Review of Systems  Constitutional: Negative for chills, fever and weight loss.  HENT: Negative for tinnitus.   Respiratory: Negative for cough, sputum production and shortness of breath.   Cardiovascular: Negative for chest pain, palpitations and leg swelling.  Gastrointestinal: Negative for abdominal pain, constipation, diarrhea and heartburn.  Genitourinary: Negative for dysuria, frequency and urgency.  Musculoskeletal: Negative for back pain, falls, joint pain and myalgias.  Skin: Negative.   Neurological: Negative for dizziness and headaches.  Psychiatric/Behavioral: Negative for depression and memory loss. The patient does not have insomnia.     Past Medical History:  Diagnosis Date  . Acute bronchiolitis due to other infectious organisms   . Acute bronchiolitis due to respiratory syncytial virus (RSV)   . Arthritis    OA  AND PAIN RIGHT KNEE  . Degeneration of intervertebral disc, site unspecified   . Disorder of bone and cartilage, unspecified   . Disturbance of skin sensation   . Encounter for long-term (current) use of other medications   . GERD (gastroesophageal reflux disease)    PT STATES SEVERE REFLUX IF SHE DOES NOT TAKE HER OMEPRAZOLE  . Headache(784.0)    WHEN REFLUX SEVERE  . Hypothyroidism   . Long term (current) use of anticoagulants   . Osteoarthrosis, unspecified whether generalized or localized, lower leg   . Osteoporosis, unspecified   . Other and unspecified hyperlipidemia   . Other atopic dermatitis and related conditions   . Other malaise and fatigue   . Other specified pre-operative examination 11/07/2011  . Thoracic or lumbosacral neuritis or radiculitis, unspecified   . Unspecified vitamin D deficiency   . Vegetarian diet    Past Surgical History:  Procedure Laterality Date  . CERVICAL BIOPSY  W/ LOOP ELECTRODE EXCISION    . EYE SURGERY  2016   Left eye, Cataract Removal  . TEETH EXTRACTIONS--NO OTHER SURGERY    . TOTAL KNEE ARTHROPLASTY  11/28/2011   Procedure: TOTAL KNEE ARTHROPLASTY;  Surgeon: Jacki Cones, MD;  Location: [facility];  Service: Orthopedics;  Laterality: Right;  . TUBAL LIGATION     Social History:   reports that she has never smoked. She has never used smokeless tobacco. She reports that she does not drink alcohol or use drugs.  Family History  Problem Relation  Age of Onset  . Alport syndrome Brother   . Breast cancer Neg Hx     Medications: Patient's Medications  New Prescriptions   No medications on file  Previous Medications   ASPIRIN 81 MG TABLET    Take 81 mg by mouth daily.   BETAMETHASONE DIPROPIONATE (DIPROLENE) 0.05 % CREAM    Apply topically 2 (two) times daily.   CALCIUM CARBONATE (OS-CAL) 600 MG TABS    Take 600 mg by mouth 2 (two) times daily with a meal.    DENOSUMAB (PROLIA) 60 MG/ML SOSY INJECTION    Inject 60 mg into the skin  every 6 (six) months.   FERROUS SULFATE 325 (65 FE) MG TABLET    Take 1 tablet (325 mg total) by mouth daily with breakfast.   LEVOTHYROXINE (SYNTHROID) 50 MCG TABLET    Take 1 tablet (50 mcg total) by mouth daily.   OMEPRAZOLE (PRILOSEC) 20 MG CAPSULE    TAKE ONE CAPSULE BY MOUTH ONCE DAILY TO REDUCE  STOMACH ACID   SIMVASTATIN (ZOCOR) 20 MG TABLET    TAKE ONE TABLET BY MOUTH ONCE DAILY FOR CHOLESTEROL   ZOSTER VACCINE LIVE, PF, (ZOSTAVAX) 61443 UNT/0.65ML INJECTION    Inject 0.65 mLs into the skin once.  Modified Medications   No medications on file  Discontinued Medications   No medications on file    Physical Exam:  Vitals:   05/06/19 1042  BP: 122/72  Pulse: 83  Temp: (!) 97.3 F (36.3 C)  TempSrc: Temporal  SpO2: 98%  Weight: 129 lb 6.4 oz (58.7 kg)  Height: 4\' 9"  (1.448 m)   Body mass index is 28 kg/m. Wt Readings from Last 3 Encounters:  05/06/19 129 lb 6.4 oz (58.7 kg)  03/05/19 129 lb 6.4 oz (58.7 kg)  01/21/19 130 lb 3.2 oz (59.1 kg)    Physical Exam Constitutional:      General: She is not in acute distress.    Appearance: She is well-developed. She is not diaphoretic.  HENT:     Head: Normocephalic and atraumatic.     Mouth/Throat:     Pharynx: No oropharyngeal exudate.  Eyes:     Conjunctiva/sclera: Conjunctivae normal.     Pupils: Pupils are equal, round, and reactive to light.  Cardiovascular:     Rate and Rhythm: Normal rate and regular rhythm.     Heart sounds: Normal heart sounds.  Pulmonary:     Effort: Pulmonary effort is normal.     Breath sounds: Normal breath sounds.  Abdominal:     General: Bowel sounds are normal.     Palpations: Abdomen is soft.  Musculoskeletal:        General: No tenderness.     Cervical back: Normal range of motion and neck supple.  Skin:    General: Skin is warm and dry.  Neurological:     Mental Status: She is alert and oriented to person, place, and time.     Labs reviewed: Basic Metabolic Panel: Recent  Labs    09/08/18 0841 04/29/19 1008  NA 138 140  K 4.3 4.4  CL 106 103  CO2 26 26  GLUCOSE 106* 106*  BUN 8 8  CREATININE 0.54* 0.60  CALCIUM 9.0 9.3  TSH 6.51* 1.89   Liver Function Tests: Recent Labs    09/08/18 0841 04/29/19 1008  AST 22 24  ALT 24 26  BILITOT 0.5 0.6  PROT 6.9 7.3   No results for input(s): LIPASE, AMYLASE in the  last 8760 hours. No results for input(s): AMMONIA in the last 8760 hours. CBC: Recent Labs    09/08/18 0841 04/29/19 1008  WBC 7.6 9.7  NEUTROABS 3,899 5,684  HGB 12.4 13.6  HCT 36.7 39.8  MCV 92.2 91.7  PLT 281 299   Lipid Panel: Recent Labs    09/08/18 0841 04/29/19 1008  CHOL 212* 212*  HDL 54 55  LDLCALC 134* 130*  TRIG 127 159*  CHOLHDL 3.9 3.9   TSH: Recent Labs    09/08/18 0841 04/29/19 1008  TSH 6.51* 1.89   A1C: Lab Results  Component Value Date   HGBA1C 5.8 (H) 04/29/2019     Assessment/Plan 1. Age-related osteoporosis without current pathological fracture -continues on prolia - DG Bone Density; Future - calcium carbonate (OS-CAL) 600 MG TABS tablet; Take 1 tablet (600 mg total) by mouth 2 (two) times daily with a meal.  Dispense: 90 tablet; Refill: 1  2. Anemia, unspecified type - CBC with Differential/Platelet; Future - ferrous sulfate 325 (65 FE) MG tablet; Take 1 tablet (325 mg total) by mouth daily with breakfast.  Dispense: 90 tablet; Refill: 1  3. Hyperlipidemia LDL goal <100 Not controlled, encouraged dietary changes. Will also increase zocor to 40 mg daily - simvastatin (ZOCOR) 40 MG tablet; TAKE ONE TABLET BY MOUTH ONCE DAILY FOR CHOLESTEROL  Dispense: 90 tablet; Refill: 1 - Lipid panel; Future - COMPLETE METABOLIC PANEL WITH GFR; Future  4. Hypothyroidism due to acquired atrophy of thyroid -at goal, continues on synthroid 50 mcg daily - TSH; Future - levothyroxine (SYNTHROID) 50 MCG tablet; Take 1 tablet (50 mcg total) by mouth daily.  Dispense: 90 tablet; Refill: 1  5.  Prediabetes -A1c stable, continue dietary modification and diabetic diet - COMPLETE METABOLIC PANEL WITH GFR; Future - Hemoglobin A1c; Future  6. Estrogen deficiency - DG Bone Density; Future  7. Gastroesophageal reflux disease Controlled on omeprazole.  - omeprazole (PRILOSEC) 20 MG capsule; TAKE ONE CAPSULE BY MOUTH ONCE DAILY TO REDUCE  STOMACH ACID  Dispense: 90 capsule; Refill: 1  Next appt: 6 months for routine follow up.  Janene HarveyJessica K. Biagio BorgEubanks, AGNP  [facility] [phone]

## 2019-05-09 ENCOUNTER — Other Ambulatory Visit: Payer: Self-pay | Admitting: Nurse Practitioner

## 2019-05-09 DIAGNOSIS — L309 Dermatitis, unspecified: Secondary | ICD-10-CM

## 2019-05-11 NOTE — Telephone Encounter (Signed)
Please advise if this is a long term medication and if additional refills to be given

## 2019-07-27 ENCOUNTER — Other Ambulatory Visit: Payer: Self-pay | Admitting: Nurse Practitioner

## 2019-08-19 ENCOUNTER — Other Ambulatory Visit: Payer: Self-pay

## 2019-08-19 ENCOUNTER — Ambulatory Visit (INDEPENDENT_AMBULATORY_CARE_PROVIDER_SITE_OTHER): Payer: Medicare Other | Admitting: *Deleted

## 2019-08-19 DIAGNOSIS — M810 Age-related osteoporosis without current pathological fracture: Secondary | ICD-10-CM | POA: Diagnosis not present

## 2019-08-19 MED ORDER — DENOSUMAB 60 MG/ML ~~LOC~~ SOSY
60.0000 mg | PREFILLED_SYRINGE | Freq: Once | SUBCUTANEOUS | Status: AC
  Administered 2019-08-19: 11:00:00 60 mg via SUBCUTANEOUS

## 2019-08-19 NOTE — Progress Notes (Signed)
Patient tolerated injection well. No complaints.  

## 2019-09-05 ENCOUNTER — Other Ambulatory Visit: Payer: Self-pay | Admitting: Nurse Practitioner

## 2019-09-05 DIAGNOSIS — L309 Dermatitis, unspecified: Secondary | ICD-10-CM

## 2019-09-18 ENCOUNTER — Encounter: Payer: Medicare Other | Admitting: Nurse Practitioner

## 2019-10-28 ENCOUNTER — Other Ambulatory Visit: Payer: Self-pay | Admitting: Nurse Practitioner

## 2019-10-28 DIAGNOSIS — L309 Dermatitis, unspecified: Secondary | ICD-10-CM

## 2019-11-02 ENCOUNTER — Other Ambulatory Visit: Payer: Medicare Other

## 2019-11-04 ENCOUNTER — Ambulatory Visit: Payer: Medicare Other | Admitting: Nurse Practitioner

## 2020-02-03 ENCOUNTER — Other Ambulatory Visit: Payer: Self-pay | Admitting: Nurse Practitioner

## 2020-02-03 DIAGNOSIS — L309 Dermatitis, unspecified: Secondary | ICD-10-CM

## 2020-02-19 ENCOUNTER — Ambulatory Visit: Payer: Medicare Other

## 2020-03-07 ENCOUNTER — Ambulatory Visit (INDEPENDENT_AMBULATORY_CARE_PROVIDER_SITE_OTHER): Payer: Medicare Other | Admitting: Obstetrics and Gynecology

## 2020-03-07 ENCOUNTER — Other Ambulatory Visit: Payer: Self-pay

## 2020-03-07 ENCOUNTER — Other Ambulatory Visit (HOSPITAL_COMMUNITY)
Admission: RE | Admit: 2020-03-07 | Discharge: 2020-03-07 | Disposition: A | Discharge status: Home / Self Care | Payer: Medicare Other | Source: Ambulatory Visit | Attending: Obstetrics and Gynecology | Admitting: Obstetrics and Gynecology

## 2020-03-07 ENCOUNTER — Encounter: Payer: Self-pay | Admitting: Obstetrics and Gynecology

## 2020-03-07 VITALS — BP 138/78 | HR 88 | Ht <= 58 in | Wt 129.0 lb

## 2020-03-07 DIAGNOSIS — Z87410 Personal history of cervical dysplasia: Secondary | ICD-10-CM

## 2020-03-07 DIAGNOSIS — Z124 Encounter for screening for malignant neoplasm of cervix: Secondary | ICD-10-CM

## 2020-03-07 DIAGNOSIS — Z01419 Encounter for gynecological examination (general) (routine) without abnormal findings: Secondary | ICD-10-CM | POA: Diagnosis not present

## 2020-03-07 DIAGNOSIS — Z1151 Encounter for screening for human papillomavirus (HPV): Secondary | ICD-10-CM | POA: Diagnosis not present

## 2020-03-07 DIAGNOSIS — Z1272 Encounter for screening for malignant neoplasm of vagina: Secondary | ICD-10-CM

## 2020-03-07 DIAGNOSIS — Z87411 Personal history of vaginal dysplasia: Secondary | ICD-10-CM

## 2020-03-07 DIAGNOSIS — N952 Postmenopausal atrophic vaginitis: Secondary | ICD-10-CM

## 2020-03-07 NOTE — Progress Notes (Signed)
74 y.o. G25P3003 Married Asian Not Hispanic or Latino female here for annual exam.  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     No LMP recorded. Patient is postmenopausal.          Sexually active: No.  The current method of family planning is post menopausal status.    Exercising: Yes.    walking  Smoker:  no  Health Maintenance: Pap:  03/05/19 negative, negative hpv; 02/28/2018 WNL NEG HPV, 02/07/2017 WNL NEG HPV History of abnormal Pap:  Yes, history of leep and colpo MMG:  04/10/19 density B Bi-rads 1 Neg  BMD:   01/25/2017, osteopenia (followed by primary MD), on prolia Colonoscopy: cologuard WNL  TDaP:  2012 Gardasil: never    reports that she has never smoked. She has never used smokeless tobacco. She reports that she does not drink alcohol and does not use drugs. 2 sons, one daughter, 75 grandson's   Past Medical History:  Diagnosis Date  . Acute bronchiolitis due to other infectious organisms   . Acute bronchiolitis due to respiratory syncytial virus (RSV)   . Arthritis    OA AND PAIN RIGHT KNEE  . Degeneration of intervertebral disc, site unspecified   . Disorder of bone and cartilage, unspecified   . Disturbance of skin sensation   . Encounter for long-term (current) use of other medications   . GERD (gastroesophageal reflux disease)    PT STATES SEVERE REFLUX IF SHE DOES NOT TAKE HER OMEPRAZOLE  . Headache(784.0)    WHEN REFLUX SEVERE  . Hypothyroidism   . Long term (current) use of anticoagulants   . Osteoarthrosis, unspecified whether generalized or localized, lower leg   . Osteoporosis, unspecified   . Other and unspecified hyperlipidemia   . Other atopic dermatitis and related conditions   . Other malaise and fatigue   . Other specified pre-operative examination 11/07/2011  . Thoracic or lumbosacral neuritis or radiculitis, unspecified   . Unspecified vitamin D deficiency   . Vegetarian diet     Past Surgical History:  Procedure Laterality Date  .  CERVICAL BIOPSY  W/ LOOP ELECTRODE EXCISION    . EYE SURGERY  2016   Left eye, Cataract Removal  . TEETH EXTRACTIONS--NO OTHER SURGERY    . TOTAL KNEE ARTHROPLASTY  11/28/2011   Procedure: TOTAL KNEE ARTHROPLASTY;  Surgeon: Jacki Cones, MD;  Location: [facility];  Service: Orthopedics;  Laterality: Right;  . TUBAL LIGATION      Current Outpatient Medications  Medication Sig Dispense Refill  . aspirin 81 MG tablet Take 81 mg by mouth daily.    . betamethasone dipropionate 0.05 % cream APPLY  CREAM TOPICALLY TO AFFECTED AREA TWICE DAILY 30 g 0  . calcium carbonate (OS-CAL) 600 MG TABS tablet Take 1 tablet (600 mg total) by mouth 2 (two) times daily with a meal. 90 tablet 1  . ferrous sulfate 325 (65 FE) MG tablet Take 1 tablet (325 mg total) by mouth daily with breakfast. 90 tablet 1  . levothyroxine (SYNTHROID) 50 MCG tablet Take 1 tablet (50 mcg total) by mouth daily. 90 tablet 1  . omeprazole (PRILOSEC) 20 MG capsule TAKE ONE CAPSULE BY MOUTH ONCE DAILY TO REDUCE  STOMACH ACID 90 capsule 1  . PROLIA 60 MG/ML SOSY injection INJECT 60 MG INTO THE SKIN EVERY 6 (SIX) MONTHS 1 mL 0  . simvastatin (ZOCOR) 40 MG tablet TAKE ONE TABLET BY MOUTH ONCE DAILY FOR CHOLESTEROL 90 tablet 1  .  Zoster Vaccine Live, PF, (ZOSTAVAX) 69794 UNT/0.65ML injection Inject 0.65 mLs into the skin once.     No current facility-administered medications for this visit.    Family History  Problem Relation Age of Onset  . Alport syndrome Brother   . Breast cancer Neg Hx     Review of Systems  All other systems reviewed and are negative.   Exam:   There were no vitals taken for this visit.  Weight change: @WEIGHTCHANGE @ Height:      Ht Readings from Last 3 Encounters:  05/06/19 4\' 9"  (1.448 m)  03/05/19 4\' 9"  (1.448 m)  01/21/19 4\' 9"  (1.448 m)    General appearance: alert, cooperative and appears stated age Head: Normocephalic, without obvious abnormality, atraumatic Neck: no adenopathy, supple,  symmetrical, trachea midline and thyroid normal to inspection and palpation Breasts: normal appearance, no masses or tenderness Abdomen: soft, non-tender; non distended,  no masses,  no organomegaly Extremities: extremities normal, atraumatic, no cyanosis or edema Skin: Skin color, texture, turgor normal. No rashes or lesions Lymph nodes: Cervical, supraclavicular, and axillary nodes normal. No abnormal inguinal nodes palpated Neurologic: Grossly normal   Pelvic: External genitalia:  no lesions              Urethra:  normal appearing urethra with no masses, tenderness or lesions              Bartholins and Skenes: normal                 Vagina: atrophic appearing vagina with normal color and discharge, no lesions              Cervix: not seen, scarred above her vagina               Bimanual Exam:  Uterus:  no masses or tenderness              Adnexa: no mass, fullness, tenderness               Rectovaginal: Confirms               Anus:  normal sphincter tone, no lesions  03/07/19 chaperoned for the exam.  A:  GYN exam  Vaginal atrophy  H/O vaginal and cervical dysplasia. S/P 5 FU and LEEP  Osteopenia, on prolia with her primary  P:   Pap with hpv  Mammogram in 12/21,  DEXA with primary  Discussed breast self exam  Discussed calcium and vit D intake   Colon cancer screening UTD  Labs with primary

## 2020-03-07 NOTE — Patient Instructions (Signed)
EXERCISE AND DIET:  We recommended that you start or continue a regular exercise program for good health. Regular exercise means any activity that makes your heart beat faster and makes you sweat.  We recommend exercising at least 30 minutes per day at least 3 days a week, preferably 4 or 5.  We also recommend a diet low in fat and sugar.  Inactivity, poor dietary choices and obesity can cause diabetes, heart attack, stroke, and kidney damage, among others.    ALCOHOL AND SMOKING:  Women should limit their alcohol intake to no more than 7 drinks/beers/glasses of wine (combined, not each!) per week. Moderation of alcohol intake to this level decreases your risk of breast cancer and liver damage. And of course, no recreational drugs are part of a healthy lifestyle.  And absolutely no smoking or even second hand smoke. Most people know smoking can cause heart and lung diseases, but did you know it also contributes to weakening of your bones? Aging of your skin?  Yellowing of your teeth and nails?  CALCIUM AND VITAMIN D:  Adequate intake of calcium and Vitamin D are recommended.  The recommendations for exact amounts of these supplements seem to change often, but generally speaking 1,200 mg of calcium (between diet and supplement) and 800 units of Vitamin D per day seems prudent. Certain women may benefit from higher intake of Vitamin D.  If you are among these women, your doctor will have told you during your visit.    PAP SMEARS:  Pap smears, to check for cervical cancer or precancers,  have traditionally been done yearly, although recent scientific advances have shown that most women can have pap smears less often.  However, every woman still should have a physical exam from her gynecologist every year. It will include a breast check, inspection of the vulva and vagina to check for abnormal growths or skin changes, a visual exam of the cervix, and then an exam to evaluate the size and shape of the uterus and  ovaries.  And after 76 years of age, a rectal exam is indicated to check for rectal cancers. We will also provide age appropriate advice regarding health maintenance, like when you should have certain vaccines, screening for sexually transmitted diseases, bone density testing, colonoscopy, mammograms, etc.   MAMMOGRAMS:  All women over 40 years old should have a yearly mammogram. Many facilities now offer a "3D" mammogram, which may cost around $50 extra out of pocket. If possible,  we recommend you accept the option to have the 3D mammogram performed.  It both reduces the number of women who will be called back for extra views which then turn out to be normal, and it is better than the routine mammogram at detecting truly abnormal areas.    COLON CANCER SCREENING: Now recommend starting at age 45. At this time colonoscopy is not covered for routine screening until 50. There are take home tests that can be done between 45-49.   COLONOSCOPY:  Colonoscopy to screen for colon cancer is recommended for all women at age 50.  We know, you hate the idea of the prep.  We agree, BUT, having colon cancer and not knowing it is worse!!  Colon cancer so often starts as a polyp that can be seen and removed at colonscopy, which can quite literally save your life!  And if your first colonoscopy is normal and you have no family history of colon cancer, most women don't have to have it again for   10 years.  Once every ten years, you can do something that may end up saving your life, right?  We will be happy to help you get it scheduled when you are ready.  Be sure to check your insurance coverage so you understand how much it will cost.  It may be covered as a preventative service at no cost, but you should check your particular policy.      Breast Self-Awareness Breast self-awareness means being familiar with how your breasts look and feel. It involves checking your breasts regularly and reporting any changes to your  health care provider. Practicing breast self-awareness is important. A change in your breasts can be a sign of a serious medical problem. Being familiar with how your breasts look and feel allows you to find any problems early, when treatment is more likely to be successful. All women should practice breast self-awareness, including women who have had breast implants. How to do a breast self-exam One way to learn what is normal for your breasts and whether your breasts are changing is to do a breast self-exam. To do a breast self-exam: Look for Changes  1. Remove all the clothing above your waist. 2. Stand in front of a mirror in a room with good lighting. 3. Put your hands on your hips. 4. Push your hands firmly downward. 5. Compare your breasts in the mirror. Look for differences between them (asymmetry), such as: ? Differences in shape. ? Differences in size. ? Puckers, dips, and bumps in one breast and not the other. 6. Look at each breast for changes in your skin, such as: ? Redness. ? Scaly areas. 7. Look for changes in your nipples, such as: ? Discharge. ? Bleeding. ? Dimpling. ? Redness. ? A change in position. Feel for Changes Carefully feel your breasts for lumps and changes. It is best to do this while lying on your back on the floor and again while sitting or standing in the shower or tub with soapy water on your skin. Feel each breast in the following way:  Place the arm on the side of the breast you are examining above your head.  Feel your breast with the other hand.  Start in the nipple area and make  inch (2 cm) overlapping circles to feel your breast. Use the pads of your three middle fingers to do this. Apply light pressure, then medium pressure, then firm pressure. The light pressure will allow you to feel the tissue closest to the skin. The medium pressure will allow you to feel the tissue that is a little deeper. The firm pressure will allow you to feel the tissue  close to the ribs.  Continue the overlapping circles, moving downward over the breast until you feel your ribs below your breast.  Move one finger-width toward the center of the body. Continue to use the  inch (2 cm) overlapping circles to feel your breast as you move slowly up toward your collarbone.  Continue the up and down exam using all three pressures until you reach your armpit.  Write Down What You Find  Write down what is normal for each breast and any changes that you find. Keep a written record with breast changes or normal findings for each breast. By writing this information down, you do not need to depend only on memory for size, tenderness, or location. Write down where you are in your menstrual cycle, if you are still menstruating. If you are having trouble noticing differences   in your breasts, do not get discouraged. With time you will become more familiar with the variations in your breasts and more comfortable with the exam. How often should I examine my breasts? Examine your breasts every month. If you are breastfeeding, the best time to examine your breasts is after a feeding or after using a breast pump. If you menstruate, the best time to examine your breasts is 5-7 days after your period is over. During your period, your breasts are lumpier, and it may be more difficult to notice changes. When should I see my health care provider? See your health care provider if you notice:  A change in shape or size of your breasts or nipples.  A change in the skin of your breast or nipples, such as a reddened or scaly area.  Unusual discharge from your nipples.  A lump or thick area that was not there before.  Pain in your breasts.  Anything that concerns you.  

## 2020-03-08 LAB — CYTOLOGY - PAP
Comment: NEGATIVE
Diagnosis: NEGATIVE
High risk HPV: NEGATIVE

## 2020-03-10 NOTE — Addendum Note (Signed)
Addended by: Tobi Bastos on: 03/10/2020 10:36 PM   Modules accepted: Level of Service

## 2020-03-25 ENCOUNTER — Other Ambulatory Visit: Payer: Self-pay | Admitting: Nurse Practitioner

## 2020-03-25 DIAGNOSIS — Z1231 Encounter for screening mammogram for malignant neoplasm of breast: Secondary | ICD-10-CM

## 2020-05-11 ENCOUNTER — Ambulatory Visit
Admission: RE | Admit: 2020-05-11 | Discharge: 2020-05-11 | Disposition: A | Discharge status: Home / Self Care | Payer: Medicare Other | Source: Ambulatory Visit | Attending: Nurse Practitioner | Admitting: Nurse Practitioner

## 2020-05-11 ENCOUNTER — Other Ambulatory Visit: Payer: Self-pay

## 2020-05-11 DIAGNOSIS — Z1231 Encounter for screening mammogram for malignant neoplasm of breast: Secondary | ICD-10-CM

## 2020-09-09 ENCOUNTER — Encounter: Payer: Self-pay | Admitting: Physical Therapy

## 2020-09-09 ENCOUNTER — Other Ambulatory Visit: Payer: Self-pay

## 2020-09-09 ENCOUNTER — Ambulatory Visit: Payer: Medicare Other | Attending: Orthopedic Surgery | Admitting: Physical Therapy

## 2020-09-09 DIAGNOSIS — M25661 Stiffness of right knee, not elsewhere classified: Secondary | ICD-10-CM | POA: Insufficient documentation

## 2020-09-09 DIAGNOSIS — G8929 Other chronic pain: Secondary | ICD-10-CM | POA: Insufficient documentation

## 2020-09-09 DIAGNOSIS — M25562 Pain in left knee: Secondary | ICD-10-CM | POA: Diagnosis present

## 2020-09-09 DIAGNOSIS — R262 Difficulty in walking, not elsewhere classified: Secondary | ICD-10-CM | POA: Insufficient documentation

## 2020-09-09 DIAGNOSIS — M25561 Pain in right knee: Secondary | ICD-10-CM | POA: Insufficient documentation

## 2020-09-09 NOTE — Patient Instructions (Signed)
Access Code: [email or web address] URL: [email or web address] Date: 09/09/2020 Prepared by: Stacie Glaze  Exercises Supine ITB Stretch with Strap - 2 x daily - 7 x weekly - 1 sets - 5 reps - 30 hold Seated Knee Flexion Stretch - 2 x daily - 7 x weekly - 1 sets - 5 reps - 30 hold

## 2020-09-09 NOTE — Therapy (Signed)
[facility] [address] [place], [place], [address] Phone: [phone]   Fax:  [phone]  Physical Therapy Evaluation  Patient Details  Name: [name] [name] MRN: [id number] Date of Birth: February 24, 1946 Referring Provider (PT): Nelia Shi, [place]   Encounter Date: 09/09/2020   PT End of Session - 09/09/20 0819    Visit Number 1    Date for PT Re-Evaluation 12/10/20    Authorization Type [employer] Medicare    PT Start Time 0750    PT Stop Time 0836    PT Time Calculation (min) 46 min    Activity Tolerance Patient tolerated treatment well    Behavior During Therapy [facility] for tasks assessed/performed           Past Medical History:  Diagnosis Date  . Acute bronchiolitis due to other infectious organisms   . Acute bronchiolitis due to respiratory syncytial virus (RSV)   . Arthritis    OA AND PAIN RIGHT KNEE  . Degeneration of intervertebral disc, site unspecified   . Disorder of bone and cartilage, unspecified   . Disturbance of skin sensation   . Encounter for long-term (current) use of other medications   . GERD (gastroesophageal reflux disease)    PT STATES SEVERE REFLUX IF SHE DOES NOT TAKE HER OMEPRAZOLE  . Headache(784.0)    WHEN REFLUX SEVERE  . Hypothyroidism   . Long term (current) use of anticoagulants   . Osteoarthrosis, unspecified whether generalized or localized, lower leg   . Osteoporosis, unspecified   . Other and unspecified hyperlipidemia   . Other atopic dermatitis and related conditions   . Other malaise and fatigue   . Other specified pre-operative examination 11/07/2011  . Thoracic or lumbosacral neuritis or radiculitis, unspecified   . Unspecified vitamin D deficiency   . Vegetarian diet     Past Surgical History:  Procedure Laterality Date  . CERVICAL BIOPSY  W/ LOOP ELECTRODE EXCISION    . EYE SURGERY  2016   Left eye, Cataract Removal  . TEETH EXTRACTIONS--NO OTHER SURGERY    . TOTAL  KNEE ARTHROPLASTY  11/28/2011   Procedure: TOTAL KNEE ARTHROPLASTY;  Surgeon: Jacki Cones, MD;  Location: [facility];  Service: Orthopedics;  Laterality: Right;  . TUBAL LIGATION      There were no vitals filed for this visit.    Subjective Assessment - 09/09/20 0753    Subjective Patient reports that she has bilateral knee pain, MD repors OA, she has a histroy of right TKA about 9 years ago.  REports pain starting about a year ago, worse recently, she has left ITB and lateral hip/thigh pain and tingling.  He husband is present and answers most of the questions, they report that until recently she was able to do stairs step over step, now one at a time    Pertinent History right TKA    Limitations Standing;Walking;House hold activities    Patient Stated Goals have no pain, do stairs normally    Currently in Pain? No/denies    Pain Location Knee    Pain Orientation Right;Left    Pain Descriptors / Indicators Aching;Sore;Tingling    Pain Type Chronic pain    Pain Radiating Towards left lateral thigh tingling    Pain Onset More than a month ago    Pain Frequency Intermittent    Aggravating Factors  at night worse, lying on left side or on back thigh hurts, stairs, pain can be 6/10  Pain Relieving Factors lie on the right side, rest, standing minimal to no pain    Effect of Pain on Daily Activities difficulty sleeping, difficulty walking and on stairs              [facility] PT Assessment - 09/09/20 0001      Assessment   Medical Diagnosis knee pain    Referring Provider (PT) Nelia Shi, PA    Onset Date/Surgical Date 08/10/20    Prior Therapy for the back and left leg pain 4 years ago      Precautions   Precautions None      Balance Screen   Has the patient fallen in the past 6 months No    Has the patient had a decrease in activity level because of a fear of falling?  No    Is the patient reluctant to leave their home because of a fear of falling?  No      Home  Environment   Additional Comments has stairs, does housework      Prior Function   Level of Independence Independent    Vocation Retired    Leisure some walking in the past      ROM / Strength   AROM / PROM / Strength AROM;PROM;Strength      AROM   AROM Assessment Site Knee    Right/Left Knee Right;Left    Right Knee Extension 0    Right Knee Flexion 95    Left Knee Extension 0    Left Knee Flexion 120      PROM   PROM Assessment Site Knee    Right/Left Knee Right    Right Knee Flexion 105   with pain     Strength   Overall Strength Comments left hip abduction4-/5    Strength Assessment Site Knee    Right/Left Knee Right;Left    Right Knee Flexion 4-/5    Right Knee Extension 4-/5    Left Knee Flexion 4-/5    Left Knee Extension 4-/5      Flexibility   Soft Tissue Assessment /Muscle Length yes    Hamstrings tight    Quadriceps tight    ITB very tight and painful on the left    Piriformis mild tightness      Palpation   Palpation comment mild tenderness in the distal ITB, right patellar tendon is very tender, mild tenderness in the left patella and the patella tendon      Ambulation/Gait   Gait Comments no device, mild trendelenberg on the left, does stairs one at a time, leads with the right leg and comes down with the left leg first, pain in the left patellar area                      Objective measurements completed on examination: See above findings.       [facility] Adult PT Treatment/Exercise - 09/09/20 0001      Modalities   Modalities Iontophoresis      Iontophoresis   Type of Iontophoresis Dexamethasone    Location left knee area    Dose 76mA    Time 4 hour patch                    PT Short Term Goals - 09/09/20 0840      PT SHORT TERM GOAL #1   Title independent with initial HEP    Time 2    Period Weeks  Status New             PT Long Term Goals - 09/09/20 0840      PT LONG TERM GOAL #1   Title decrease  pain 50%    Time 12    Period Weeks    Status New      PT LONG TERM GOAL #2   Title increase right knee AROM to 110 degrees flexion    Time 12    Period Weeks    Status New      PT LONG TERM GOAL #3   Title go up and down stairs step over step    Time 12    Period Weeks    Status New      PT LONG TERM GOAL #4   Title report sleep 50% better    Time 12    Period Weeks    Status New                  Plan - 09/09/20 0819    Clinical Impression Statement Patient has bilateral knee pain, the right knee was replaced about 9 years ago, they report pain worse over the past year, reports that she has been having more difficulty sleeping, difficulty walking and has had to do stairs one at a time due to pain.  The right knee has decreased flexion and pain in the patellar tendon area.  The left knee she has left patellar area pain and left distal ITB pain, she is very tight in the left ITB.  mild lateral tracking patella    Stability/Clinical Decision Making Stable/Uncomplicated    Clinical Decision Making Low    Rehab Potential Good    PT Frequency 2x / week    PT Duration 12 weeks    PT Treatment/Interventions ADLs/Self Care Home Management;Cryotherapy;Electrical Stimulation;Iontophoresis 4mg /ml Dexamethasone;Moist Heat;Ultrasound;Gait training;Neuromuscular re-education;Balance training;Therapeutic exercise;Therapeutic activities;Functional mobility training;Stair training;Patient/family education;Manual techniques;Dry needling    PT Next Visit Plan gave HEP and started ionto, start exercises could do STM to the ITB    Consulted and Agree with Plan of Care Patient           Patient will benefit from skilled therapeutic intervention in order to improve the following deficits and impairments:  Abnormal gait,Decreased mobility,Decreased range of motion,Decreased strength,Difficulty walking,Impaired flexibility,Pain,Decreased balance  Visit Diagnosis: Chronic pain of right knee  - Plan: PT plan of care cert/re-cert  Stiffness of right knee, not elsewhere classified - Plan: PT plan of care cert/re-cert  Acute pain of left knee - Plan: PT plan of care cert/re-cert  Difficulty in walking, not elsewhere classified - Plan: PT plan of care cert/re-cert     Problem List Patient Active Problem List   Diagnosis Date Noted  . History of total knee replacement, right 12/26/2018  . Osteoarthritis 12/26/2018  . Dermatochalasis of both upper eyelids 02/14/2018  . Posterior capsular opacification non visually significant of left eye 02/14/2018  . Pseudophakia of left eye 02/14/2018  . Estrogen deficiency 12/14/2016  . Anemia 12/14/2016  . Cortical age-related cataract of right eye 11/20/2016  . Macular hole of left eye 11/20/2016  . Nuclear sclerotic cataract of right eye 11/20/2016  . Presbyopia of both eyes 11/20/2016  . PVD (posterior vitreous detachment), right 11/20/2016  . Vaginal dysplasia 09/20/2016  . Muscle spasm of left lower extremity 05/11/2016  . Swelling of joint of left knee 05/11/2016  . Dysplasia of cervix, low grade (CIN 1) 05/11/2016  . High risk medication  use 05/11/2016  . Thyroid activity decreased 11/19/2014  . Pain, joint, multiple sites 11/19/2014  . Numbness and tingling of left leg 04/06/2014  . Routine general medical examination at a health care facility 09/29/2013  . Osteoporosis, post-menopausal 04/21/2013  . Cough variant asthma 03/17/2013  . Psoriasis 03/17/2013  . Persistent dry cough 03/17/2013  . Osteoarthritis of left knee 03/03/2013  . Psoriasis-like skin disease 03/03/2013  . Eczema 01/07/2013  . Need for prophylactic vaccination and inoculation against influenza 01/07/2013  . Prediabetes 12/17/2012  . Cough 12/17/2012  . Reactive airway disease with wheezing 12/03/2012  . URI, acute 12/03/2012  . Hypothyroidism 10/29/2012  . Unspecified vitamin D deficiency 10/29/2012  . Hyperlipidemia LDL goal <130 10/29/2012  .  GERD (gastroesophageal reflux disease) 10/29/2012  . Osteoporosis 10/29/2012  . Myalgia and myositis 10/29/2012    Jearld LeschALBRIGHT,Zandrea Kenealy W., PT 09/09/2020, 8:45 AM  [facility] [address] [place], [place], [address] Phone: [phone]   Fax:  [phone]  Name: [name] [name] MRN: [id number] Date of Birth: 07/22/1945

## 2020-09-19 ENCOUNTER — Encounter: Payer: Self-pay | Admitting: Physical Therapy

## 2020-09-19 ENCOUNTER — Ambulatory Visit: Payer: Medicare Other | Admitting: Physical Therapy

## 2020-09-19 ENCOUNTER — Other Ambulatory Visit: Payer: Self-pay

## 2020-09-19 DIAGNOSIS — G8929 Other chronic pain: Secondary | ICD-10-CM

## 2020-09-19 DIAGNOSIS — M25562 Pain in left knee: Secondary | ICD-10-CM

## 2020-09-19 DIAGNOSIS — M25661 Stiffness of right knee, not elsewhere classified: Secondary | ICD-10-CM

## 2020-09-19 DIAGNOSIS — R262 Difficulty in walking, not elsewhere classified: Secondary | ICD-10-CM

## 2020-09-19 DIAGNOSIS — M25561 Pain in right knee: Secondary | ICD-10-CM | POA: Diagnosis not present

## 2020-09-19 NOTE — Therapy (Signed)
[place]. [place], [place], [address] Phone: [phone]   Fax:  [phone]  Physical Therapy Treatment  Patient Details  Name: [name] [name] MRN: [id number] Date of Birth: 02/17/1946 Referring Provider (PT): Fenton Foy, [place]   Encounter Date: 09/19/2020   PT End of Session - 09/19/20 0951    Visit Number 2    Date for PT Re-Evaluation 12/10/20    Authorization Type UHC Medicare    PT Start Time 0909    PT Stop Time 0952    PT Time Calculation (min) 43 min    Activity Tolerance Patient tolerated treatment well    Behavior During Therapy [facility] for tasks assessed/performed           Past Medical History:  Diagnosis Date  . Acute bronchiolitis due to other infectious organisms   . Acute bronchiolitis due to respiratory syncytial virus (RSV)   . Arthritis    OA AND PAIN RIGHT KNEE  . Degeneration of intervertebral disc, site unspecified   . Disorder of bone and cartilage, unspecified   . Disturbance of skin sensation   . Encounter for long-term (current) use of other medications   . GERD (gastroesophageal reflux disease)    PT STATES SEVERE REFLUX IF SHE DOES NOT TAKE HER OMEPRAZOLE  . Headache(784.0)    WHEN REFLUX SEVERE  . Hypothyroidism   . Long term (current) use of anticoagulants   . Osteoarthrosis, unspecified whether generalized or localized, lower leg   . Osteoporosis, unspecified   . Other and unspecified hyperlipidemia   . Other atopic dermatitis and related conditions   . Other malaise and fatigue   . Other specified pre-operative examination 11/07/2011  . Thoracic or lumbosacral neuritis or radiculitis, unspecified   . Unspecified vitamin D deficiency   . Vegetarian diet     Past Surgical History:  Procedure Laterality Date  . CERVICAL BIOPSY  W/ LOOP ELECTRODE EXCISION    . EYE SURGERY  2016   Left eye, Cataract Removal  . TEETH EXTRACTIONS--NO OTHER SURGERY    . TOTAL  KNEE ARTHROPLASTY  11/28/2011   Procedure: TOTAL KNEE ARTHROPLASTY;  Surgeon: Tobi Bastos, MD;  Location: [facility];  Service: Orthopedics;  Laterality: Right;  . TUBAL LIGATION      There were no vitals filed for this visit.   Subjective Assessment - 09/19/20 0910    Subjective Patient reports that she feels the stretches help    Currently in Pain? Yes    Pain Score 1     Pain Location Knee    Pain Orientation Right;Left    Aggravating Factors  worse at night and on stairs                             [facility] Adult PT Treatment/Exercise - 09/19/20 0001      Exercises   Exercises Knee/Hip      Knee/Hip Exercises: Stretches   Passive Hamstring Stretch Both;3 reps;20 seconds    ITB Stretch Both;3 reps;20 seconds    Piriformis Stretch Both;3 reps;20 seconds    Gastroc Stretch Both;3 reps;20 seconds      Knee/Hip Exercises: Aerobic   Nustep level 4 x 5 minutes      Knee/Hip Exercises: Seated   Long Arc Quad Both;2 sets;10 reps    Long Arc Quad Weight 3 lbs.      Knee/Hip Exercises: Supine   Short Arc  Quad Sets Both;3 sets;10 reps    Short Arc [employer] Limitations 2.5#    Bridges with [employer] 2 sets;10 reps    Bridges with Clamshell 2 sets;10 reps      Knee/Hip Exercises: Sidelying   Clams 20 reps      Modalities   Modalities Iontophoresis      Iontophoresis   Type of Iontophoresis Dexamethasone    Location left knee area    Dose 23m    Time 4 hour patch      Manual Therapy   Manual Therapy Soft tissue mobilization    Soft tissue mobilization left ITB area                    PT Short Term Goals - 09/19/20 0954      PT SHORT TERM GOAL #1   Title independent with initial HEP    Status Partially Met             PT Long Term Goals - 09/09/20 0840      PT LONG TERM GOAL #1   Title decrease pain 50%    Time 12    Period Weeks    Status New      PT LONG TERM GOAL #2   Title increase right knee AROM to 110 degrees  flexion    Time 12    Period Weeks    Status New      PT LONG TERM GOAL #3   Title go up and down stairs step over step    Time 12    Period Weeks    Status New      PT LONG TERM GOAL #4   Title report sleep 50% better    Time 12    Period Weeks    Status New                 Plan - 09/19/20 07048   Clinical Impression Statement I added exercises and stretches as well as some STM, really working on the left ITB area and trying to get better mm tone.  No c/o pain with these today, only with the stretches    PT Next Visit Plan continue to work on the LE strength and flexibility    Consulted and Agree with Plan of Care Patient           Patient will benefit from skilled therapeutic intervention in order to improve the following deficits and impairments:  Abnormal gait,Decreased mobility,Decreased range of motion,Decreased strength,Difficulty walking,Impaired flexibility,Pain,Decreased balance  Visit Diagnosis: Chronic pain of right knee  Stiffness of right knee, not elsewhere classified  Acute pain of left knee  Difficulty in walking, not elsewhere classified     Problem List Patient Active Problem List   Diagnosis Date Noted  . History of total knee replacement, right 12/26/2018  . Osteoarthritis 12/26/2018  . Dermatochalasis of both upper eyelids 02/14/2018  . Posterior capsular opacification non visually significant of left eye 02/14/2018  . Pseudophakia of left eye 02/14/2018  . Estrogen deficiency 12/14/2016  . Anemia 12/14/2016  . Cortical age-related cataract of right eye 11/20/2016  . Macular hole of left eye 11/20/2016  . Nuclear sclerotic cataract of right eye 11/20/2016  . Presbyopia of both eyes 11/20/2016  . PVD (posterior vitreous detachment), right 11/20/2016  . Vaginal dysplasia 09/20/2016  . Muscle spasm of left lower extremity 05/11/2016  . Swelling of joint of left knee 05/11/2016  . Dysplasia of  cervix, low grade (CIN 1) 05/11/2016   . High risk medication use 05/11/2016  . Thyroid activity decreased 11/19/2014  . Pain, joint, multiple sites 11/19/2014  . Numbness and tingling of left leg 04/06/2014  . Routine general medical examination at a health care facility 09/29/2013  . Osteoporosis, post-menopausal 04/21/2013  . Cough variant asthma 03/17/2013  . Psoriasis 03/17/2013  . Persistent dry cough 03/17/2013  . Osteoarthritis of left knee 03/03/2013  . Psoriasis-like skin disease 03/03/2013  . Eczema 01/07/2013  . Need for prophylactic vaccination and inoculation against influenza 01/07/2013  . Prediabetes 12/17/2012  . Cough 12/17/2012  . Reactive airway disease with wheezing 12/03/2012  . URI, acute 12/03/2012  . Hypothyroidism 10/29/2012  . Unspecified vitamin D deficiency 10/29/2012  . Hyperlipidemia LDL goal <130 10/29/2012  . GERD (gastroesophageal reflux disease) 10/29/2012  . Osteoporosis 10/29/2012  . Myalgia and myositis 10/29/2012    Sumner Boast., PT 09/19/2020, 9:55 AM  [place]. [place], [place], [address] Phone: [phone]   Fax:  [phone]  Name: [name] [name] MRN: [id number] Date of Birth: December 07, 1945

## 2020-09-21 ENCOUNTER — Encounter: Payer: Self-pay | Admitting: Rehabilitative and Restorative Service Providers"

## 2020-09-21 ENCOUNTER — Other Ambulatory Visit: Payer: Self-pay

## 2020-09-21 ENCOUNTER — Ambulatory Visit: Payer: Medicare Other | Admitting: Rehabilitative and Restorative Service Providers"

## 2020-09-21 DIAGNOSIS — M25561 Pain in right knee: Secondary | ICD-10-CM | POA: Diagnosis not present

## 2020-09-21 DIAGNOSIS — M25661 Stiffness of right knee, not elsewhere classified: Secondary | ICD-10-CM

## 2020-09-21 DIAGNOSIS — R262 Difficulty in walking, not elsewhere classified: Secondary | ICD-10-CM

## 2020-09-21 DIAGNOSIS — G8929 Other chronic pain: Secondary | ICD-10-CM

## 2020-09-21 DIAGNOSIS — M25562 Pain in left knee: Secondary | ICD-10-CM

## 2020-09-21 NOTE — Therapy (Signed)
[place]. [place], [place], [address] Phone: [phone]   Fax:  [phone]  Physical Therapy Treatment  Patient Details  Name: [name] [name] MRN: [id number] Date of Birth: 09-18-1945 Referring Provider (PT): Fenton Foy, [place]   Encounter Date: 09/21/2020   PT End of Session - 09/21/20 0952    Visit Number 3    Date for PT Re-Evaluation 12/10/20    Authorization Type UHC Medicare    PT Start Time 0930    PT Stop Time 1015    PT Time Calculation (min) 45 min    Activity Tolerance Patient tolerated treatment well    Behavior During Therapy [facility] for tasks assessed/performed           Past Medical History:  Diagnosis Date  . Acute bronchiolitis due to other infectious organisms   . Acute bronchiolitis due to respiratory syncytial virus (RSV)   . Arthritis    OA AND PAIN RIGHT KNEE  . Degeneration of intervertebral disc, site unspecified   . Disorder of bone and cartilage, unspecified   . Disturbance of skin sensation   . Encounter for long-term (current) use of other medications   . GERD (gastroesophageal reflux disease)    PT STATES SEVERE REFLUX IF SHE DOES NOT TAKE HER OMEPRAZOLE  . Headache(784.0)    WHEN REFLUX SEVERE  . Hypothyroidism   . Long term (current) use of anticoagulants   . Osteoarthrosis, unspecified whether generalized or localized, lower leg   . Osteoporosis, unspecified   . Other and unspecified hyperlipidemia   . Other atopic dermatitis and related conditions   . Other malaise and fatigue   . Other specified pre-operative examination 11/07/2011  . Thoracic or lumbosacral neuritis or radiculitis, unspecified   . Unspecified vitamin D deficiency   . Vegetarian diet     Past Surgical History:  Procedure Laterality Date  . CERVICAL BIOPSY  W/ LOOP ELECTRODE EXCISION    . EYE SURGERY  2016   Left eye, Cataract Removal  . TEETH EXTRACTIONS--NO OTHER SURGERY    . TOTAL  KNEE ARTHROPLASTY  11/28/2011   Procedure: TOTAL KNEE ARTHROPLASTY;  Surgeon: Tobi Bastos, MD;  Location: [facility];  Service: Orthopedics;  Laterality: Right;  . TUBAL LIGATION      There were no vitals filed for this visit.   Subjective Assessment - 09/21/20 0950    Subjective Pt reports that she is feeling better and that the ionto and stretches have helped.    Pertinent History right TKA    Limitations Standing;Walking;House hold activities    Patient Stated Goals have no pain, do stairs normally    Currently in Pain? Yes    Pain Score 1     Pain Location Knee    Pain Orientation Right;Left    Pain Descriptors / Indicators Aching;Sore    Pain Type Chronic pain                             [facility] Adult PT Treatment/Exercise - 09/21/20 0001      High Level Balance   High Level Balance Activities Side stepping    High Level Balance Comments side stepping without UE assist down hall and back      Knee/Hip Exercises: Stretches   Passive Hamstring Stretch Both;3 reps;20 seconds      Knee/Hip Exercises: Aerobic   Nustep level 4 x 6 minutes  Knee/Hip Exercises: Seated   Long Arc Quad Both;2 sets;10 reps    Long Arc Quad Weight 3 lbs.      Knee/Hip Exercises: Supine   Short Arc Quad Sets Strengthening;Both;2 sets;10 reps    Short Arc Quad Sets Limitations 3#    Bridges with [employer] 2 sets;10 reps    Straight Leg Raises Strengthening;Both;2 sets;10 reps    Straight Leg Raises Limitations 3#      Knee/Hip Exercises: Sidelying   Clams 20 reps      Modalities   Modalities Iontophoresis      Iontophoresis   Type of Iontophoresis Dexamethasone    Location left knee area    Dose 42m    Time 4 hour patch      Manual Therapy   Manual Therapy Soft tissue mobilization    Soft tissue mobilization bilat ITB area                    PT Short Term Goals - 09/19/20 0954      PT SHORT TERM GOAL #1   Title independent with initial HEP     Status Partially Met             PT Long Term Goals - 09/21/20 0956      PT LONG TERM GOAL #1   Title decrease pain 50%    Status On-going      PT LONG TERM GOAL #2   Title increase right knee AROM to 110 degrees flexion    Baseline L knee flexion 110.  R knee flexion 120 degrees.  Measured in supine.    Status Partially Met      PT LONG TERM GOAL #3   Title go up and down stairs step over step    Status On-going      PT LONG TERM GOAL #4   Title report sleep 50% better    Status On-going                 Plan - 09/21/20 0954    Clinical Impression Statement Patient is progressing well with treatment.  She was able to add more strengthening exercises with cuing for slow pace and to focus on eccentric contraction.  She continues to perform stretches for HEP.  Pt with some mild reddness and reports of itching at last site of iontophoresis patch, and pt requested treatment again so new patch placed above other area.    PT Treatment/Interventions ADLs/Self Care Home Management;Cryotherapy;Electrical Stimulation;Iontophoresis 460mml Dexamethasone;Moist Heat;Ultrasound;Gait training;Neuromuscular re-education;Balance training;Therapeutic exercise;Therapeutic activities;Functional mobility training;Stair training;Patient/family education;Manual techniques;Dry needling    PT Next Visit Plan continue to work on the LE strength and flexibility    Consulted and Agree with Plan of Care Patient           Patient will benefit from skilled therapeutic intervention in order to improve the following deficits and impairments:  Abnormal gait,Decreased mobility,Decreased range of motion,Decreased strength,Difficulty walking,Impaired flexibility,Pain,Decreased balance  Visit Diagnosis: Chronic pain of right knee  Stiffness of right knee, not elsewhere classified  Acute pain of left knee  Difficulty in walking, not elsewhere classified     Problem List Patient Active Problem List    Diagnosis Date Noted  . History of total knee replacement, right 12/26/2018  . Osteoarthritis 12/26/2018  . Dermatochalasis of both upper eyelids 02/14/2018  . Posterior capsular opacification non visually significant of left eye 02/14/2018  . Pseudophakia of left eye 02/14/2018  . Estrogen deficiency 12/14/2016  .  Anemia 12/14/2016  . Cortical age-related cataract of right eye 11/20/2016  . Macular hole of left eye 11/20/2016  . Nuclear sclerotic cataract of right eye 11/20/2016  . Presbyopia of both eyes 11/20/2016  . PVD (posterior vitreous detachment), right 11/20/2016  . Vaginal dysplasia 09/20/2016  . Muscle spasm of left lower extremity 05/11/2016  . Swelling of joint of left knee 05/11/2016  . Dysplasia of cervix, low grade (CIN 1) 05/11/2016  . High risk medication use 05/11/2016  . Thyroid activity decreased 11/19/2014  . Pain, joint, multiple sites 11/19/2014  . Numbness and tingling of left leg 04/06/2014  . Routine general medical examination at a health care facility 09/29/2013  . Osteoporosis, post-menopausal 04/21/2013  . Cough variant asthma 03/17/2013  . Psoriasis 03/17/2013  . Persistent dry cough 03/17/2013  . Osteoarthritis of left knee 03/03/2013  . Psoriasis-like skin disease 03/03/2013  . Eczema 01/07/2013  . Need for prophylactic vaccination and inoculation against influenza 01/07/2013  . Prediabetes 12/17/2012  . Cough 12/17/2012  . Reactive airway disease with wheezing 12/03/2012  . URI, acute 12/03/2012  . Hypothyroidism 10/29/2012  . Unspecified vitamin D deficiency 10/29/2012  . Hyperlipidemia LDL goal <130 10/29/2012  . GERD (gastroesophageal reflux disease) 10/29/2012  . Osteoporosis 10/29/2012  . Myalgia and myositis 10/29/2012    Juel Burrow, PT, DPT 09/21/2020, 10:27 AM  [place]. [place], [place], [address] Phone: [phone]   Fax:  [phone]  Name: [name] [name] MRN: [id number] Date of Birth: Oct 27, 1945

## 2020-09-26 ENCOUNTER — Other Ambulatory Visit: Payer: Self-pay

## 2020-09-26 ENCOUNTER — Ambulatory Visit: Payer: Medicare Other | Admitting: Physical Therapy

## 2020-09-26 ENCOUNTER — Encounter: Payer: Self-pay | Admitting: Physical Therapy

## 2020-09-26 DIAGNOSIS — G8929 Other chronic pain: Secondary | ICD-10-CM

## 2020-09-26 DIAGNOSIS — M25562 Pain in left knee: Secondary | ICD-10-CM

## 2020-09-26 DIAGNOSIS — M25661 Stiffness of right knee, not elsewhere classified: Secondary | ICD-10-CM

## 2020-09-26 DIAGNOSIS — M25561 Pain in right knee: Secondary | ICD-10-CM | POA: Diagnosis not present

## 2020-09-26 DIAGNOSIS — R262 Difficulty in walking, not elsewhere classified: Secondary | ICD-10-CM

## 2020-09-26 NOTE — Therapy (Signed)
[place]. [place], [place], [address] Phone: [phone]   Fax:  [phone]  Physical Therapy Treatment  Patient Details  Name: [name] [name] MRN: [id number] Date of Birth: [birth year] Referring Provider (PT): Fenton Foy, [place]   Encounter Date: 09/26/2020   PT End of Session - 09/26/20 1039    Visit Number 4    Date for PT Re-Evaluation 12/10/20    Authorization Type UHC Medicare    PT Start Time 0927    PT Stop Time 1015    PT Time Calculation (min) 48 min    Activity Tolerance Patient tolerated treatment well    Behavior During Therapy [facility] for tasks assessed/performed           Past Medical History:  Diagnosis Date  . Acute bronchiolitis due to other infectious organisms   . Acute bronchiolitis due to respiratory syncytial virus (RSV)   . Arthritis    OA AND PAIN RIGHT KNEE  . Degeneration of intervertebral disc, site unspecified   . Disorder of bone and cartilage, unspecified   . Disturbance of skin sensation   . Encounter for long-term (current) use of other medications   . GERD (gastroesophageal reflux disease)    PT STATES SEVERE REFLUX IF SHE DOES NOT TAKE HER OMEPRAZOLE  . Headache(784.0)    WHEN REFLUX SEVERE  . Hypothyroidism   . Long term (current) use of anticoagulants   . Osteoarthrosis, unspecified whether generalized or localized, lower leg   . Osteoporosis, unspecified   . Other and unspecified hyperlipidemia   . Other atopic dermatitis and related conditions   . Other malaise and fatigue   . Other specified pre-operative examination 11/07/2011  . Thoracic or lumbosacral neuritis or radiculitis, unspecified   . Unspecified vitamin D deficiency   . Vegetarian diet     Past Surgical History:  Procedure Laterality Date  . CERVICAL BIOPSY  W/ LOOP ELECTRODE EXCISION    . EYE SURGERY  2016   Left eye, Cataract Removal  . TEETH EXTRACTIONS--NO OTHER SURGERY    . TOTAL  KNEE ARTHROPLASTY  11/28/2011   Procedure: TOTAL KNEE ARTHROPLASTY;  Surgeon: Tobi Bastos, MD;  Location: [facility];  Service: Orthopedics;  Laterality: Right;  . TUBAL LIGATION      There were no vitals filed for this visit.   Subjective Assessment - 09/26/20 0928    Subjective maybe a little better    Currently in Pain? Yes    Pain Score 2     Pain Location Knee    Pain Orientation Right;Lateral    Pain Relieving Factors the stretching seems to help                             [facility] Adult PT Treatment/Exercise - 09/26/20 0001      Knee/Hip Exercises: Stretches   Passive Hamstring Stretch Both;3 reps;20 seconds    ITB Stretch Both;3 reps;20 seconds    Piriformis Stretch Both;3 reps;20 seconds    Gastroc Stretch Both;3 reps;20 seconds      Knee/Hip Exercises: Aerobic   Nustep level 4 x 6 minutes      Knee/Hip Exercises: Supine   Short Arc Quad Sets Strengthening;Both;2 sets;10 reps    Short Arc Quad Sets Limitations 3    Bridges with Ball Squeeze 2 sets;10 reps    Bridges with [employer] 2 sets;10 reps    Other Supine Knee/Hip Exercises  feet on ball K2C, trunk rotation, small bridge and isometric abs, needed a lot of cues to get abs to fire      Iontophoresis   Type of Iontophoresis Dexamethasone    Location left distal ITB    Dose 49m    Time 4 hour patch      Manual Therapy   Manual Therapy Soft tissue mobilization    Soft tissue mobilization left ITB, posterior knee and into the gluteals                    PT Short Term Goals - 09/19/20 0954      PT SHORT TERM GOAL #1   Title independent with initial HEP    Status Partially Met             PT Long Term Goals - 09/21/20 0956      PT LONG TERM GOAL #1   Title decrease pain 50%    Status On-going      PT LONG TERM GOAL #2   Title increase right knee AROM to 110 degrees flexion    Baseline L knee flexion 110.  R knee flexion 120 degrees.  Measured in supine.    Status  Partially Met      PT LONG TERM GOAL #3   Title go up and down stairs step over step    Status On-going      PT LONG TERM GOAL #4   Title report sleep 50% better    Status On-going                 Plan - 09/26/20 1040    Clinical Impression Statement They reported that the last patch fell off when she got in the car, reported it was stuck to her pants, I focused on flexibility and knee, hip and core strength today, she has a very difficult time activating the core    PT Next Visit Plan work some on the core and hips    Consulted and Agree with Plan of Care Patient           Patient will benefit from skilled therapeutic intervention in order to improve the following deficits and impairments:  Abnormal gait,Decreased mobility,Decreased range of motion,Decreased strength,Difficulty walking,Impaired flexibility,Pain,Decreased balance  Visit Diagnosis: Chronic pain of right knee  Stiffness of right knee, not elsewhere classified  Acute pain of left knee  Difficulty in walking, not elsewhere classified     Problem List Patient Active Problem List   Diagnosis Date Noted  . History of total knee replacement, right 12/26/2018  . Osteoarthritis 12/26/2018  . Dermatochalasis of both upper eyelids 02/14/2018  . Posterior capsular opacification non visually significant of left eye 02/14/2018  . Pseudophakia of left eye 02/14/2018  . Estrogen deficiency 12/14/2016  . Anemia 12/14/2016  . Cortical age-related cataract of right eye 11/20/2016  . Macular hole of left eye 11/20/2016  . Nuclear sclerotic cataract of right eye 11/20/2016  . Presbyopia of both eyes 11/20/2016  . PVD (posterior vitreous detachment), right 11/20/2016  . Vaginal dysplasia 09/20/2016  . Muscle spasm of left lower extremity 05/11/2016  . Swelling of joint of left knee 05/11/2016  . Dysplasia of cervix, low grade (CIN 1) 05/11/2016  . High risk medication use 05/11/2016  . Thyroid activity  decreased 11/19/2014  . Pain, joint, multiple sites 11/19/2014  . Numbness and tingling of left leg 04/06/2014  . Routine general medical examination at a health care facility  09/29/2013  . Osteoporosis, post-menopausal 04/21/2013  . Cough variant asthma 03/17/2013  . Psoriasis 03/17/2013  . Persistent dry cough 03/17/2013  . Osteoarthritis of left knee 03/03/2013  . Psoriasis-like skin disease 03/03/2013  . Eczema 01/07/2013  . Need for prophylactic vaccination and inoculation against influenza 01/07/2013  . Prediabetes 12/17/2012  . Cough 12/17/2012  . Reactive airway disease with wheezing 12/03/2012  . URI, acute 12/03/2012  . Hypothyroidism 10/29/2012  . Unspecified vitamin D deficiency 10/29/2012  . Hyperlipidemia LDL goal <130 10/29/2012  . GERD (gastroesophageal reflux disease) 10/29/2012  . Osteoporosis 10/29/2012  . Myalgia and myositis 10/29/2012    Sumner Boast., PT 09/26/2020, 10:42 AM  [place]. [place], [place], [address] Phone: [phone]   Fax:  [phone]  Name: [name] [name] MRN: [id number] Date of Birth: 02/28/1946

## 2020-09-28 ENCOUNTER — Ambulatory Visit: Payer: Medicare Other | Admitting: Rehabilitative and Restorative Service Providers"

## 2020-09-28 ENCOUNTER — Other Ambulatory Visit: Payer: Self-pay

## 2020-09-28 ENCOUNTER — Encounter: Payer: Self-pay | Admitting: Rehabilitative and Restorative Service Providers"

## 2020-09-28 DIAGNOSIS — M25661 Stiffness of right knee, not elsewhere classified: Secondary | ICD-10-CM

## 2020-09-28 DIAGNOSIS — M25562 Pain in left knee: Secondary | ICD-10-CM

## 2020-09-28 DIAGNOSIS — G8929 Other chronic pain: Secondary | ICD-10-CM

## 2020-09-28 DIAGNOSIS — M25561 Pain in right knee: Secondary | ICD-10-CM | POA: Diagnosis not present

## 2020-09-28 DIAGNOSIS — R262 Difficulty in walking, not elsewhere classified: Secondary | ICD-10-CM

## 2020-09-28 NOTE — Therapy (Signed)
[place]. [place], [place], [address] Phone: [phone]   Fax:  [phone]  Physical Therapy Treatment  Patient Details  Name: [name] [name] MRN: [id number] Date of Birth: 05/05/1946 Referring Provider (PT): Fenton Foy, [place]   Encounter Date: 09/28/2020   PT End of Session - 09/28/20 1013    Visit Number 5    Date for PT Re-Evaluation 12/10/20    Authorization Type U[employer] Medicare    PT Start Time 0927    PT Stop Time 1011    PT Time Calculation (min) 44 min    Activity Tolerance Patient tolerated treatment well    Behavior During Therapy [facility] for tasks assessed/performed           Past Medical History:  Diagnosis Date  . Acute bronchiolitis due to other infectious organisms   . Acute bronchiolitis due to respiratory syncytial virus (RSV)   . Arthritis    OA AND PAIN RIGHT KNEE  . Degeneration of intervertebral disc, site unspecified   . Disorder of bone and cartilage, unspecified   . Disturbance of skin sensation   . Encounter for long-term (current) use of other medications   . GERD (gastroesophageal reflux disease)    PT STATES SEVERE REFLUX IF SHE DOES NOT TAKE HER OMEPRAZOLE  . Headache(784.0)    WHEN REFLUX SEVERE  . Hypothyroidism   . Long term (current) use of anticoagulants   . Osteoarthrosis, unspecified whether generalized or localized, lower leg   . Osteoporosis, unspecified   . Other and unspecified hyperlipidemia   . Other atopic dermatitis and related conditions   . Other malaise and fatigue   . Other specified pre-operative examination 11/07/2011  . Thoracic or lumbosacral neuritis or radiculitis, unspecified   . Unspecified vitamin D deficiency   . Vegetarian diet     Past Surgical History:  Procedure Laterality Date  . CERVICAL BIOPSY  W/ LOOP ELECTRODE EXCISION    . EYE SURGERY  2016   Left eye, Cataract Removal  . TEETH EXTRACTIONS--NO OTHER SURGERY    . TOTAL  KNEE ARTHROPLASTY  11/28/2011   Procedure: TOTAL KNEE ARTHROPLASTY;  Surgeon: Tobi Bastos, MD;  Location: [facility];  Service: Orthopedics;  Laterality: Right;  . TUBAL LIGATION      There were no vitals filed for this visit.   Subjective Assessment - 09/28/20 1009    Subjective Pt reports that she has been having some itching from the site where the iontophoresis was located.  Reports that she is having tingling in her legs at night.    Patient is accompained by: Family member;Interpreter    Pertinent History right TKA    Limitations Standing;Walking;House hold activities    Patient Stated Goals have no pain, do stairs normally    Currently in Pain? Yes    Pain Score 2     Pain Location Knee    Pain Orientation Right;Lateral    Pain Descriptors / Indicators Aching;Tingling    Pain Type Chronic pain                             [facility] Adult PT Treatment/Exercise - 09/28/20 0001      Knee/Hip Exercises: Stretches   Passive Hamstring Stretch Both;3 reps;20 seconds    ITB Stretch Both;3 reps;20 seconds    Piriformis Stretch Both;3 reps;20 seconds    Gastroc Stretch Both;3 reps;20 seconds  Knee/Hip Exercises: Aerobic   Nustep level 4 x 6 minutes      Knee/Hip Exercises: Supine   Short Arc Quad Sets Strengthening;Both;2 sets;10 reps    Short Arc Quad Sets Limitations 3#    Bridges Both;2 sets;10 reps    Bridges Limitations on green theraball    Bridges with [employer] 2 sets;10 reps    Straight Leg Raises Strengthening;Both;2 sets;10 reps    Straight Leg Raises Limitations 3#    Other Supine Knee/Hip Exercises Clamshell with red tband 2x10    Other Supine Knee/Hip Exercises feet on ball K2C, trunk rotation, needed a lot of cues to get abs to fire      Knee/Hip Exercises: Prone   Hip Extension Both;2 sets;10 reps                    PT Short Term Goals - 09/28/20 1035      PT SHORT TERM GOAL #1   Title independent with initial HEP     Status Achieved             PT Long Term Goals - 09/21/20 0956      PT LONG TERM GOAL #1   Title decrease pain 50%    Status On-going      PT LONG TERM GOAL #2   Title increase right knee AROM to 110 degrees flexion    Baseline L knee flexion 110.  R knee flexion 120 degrees.  Measured in supine.    Status Partially Met      PT LONG TERM GOAL #3   Title go up and down stairs step over step    Status On-going      PT LONG TERM GOAL #4   Title report sleep 50% better    Status On-going                 Plan - 09/28/20 1028    Clinical Impression Statement Patient continues to require cuing to activate core during therapy session.  Her most difficult exercise was the prone hip extension.  Patient with great hip extension weakness noted during session.  She continues to require skilled PT to progress towards goal related activities.    PT Treatment/Interventions ADLs/Self Care Home Management;Cryotherapy;Electrical Stimulation;Iontophoresis 49m/ml Dexamethasone;Moist Heat;Ultrasound;Gait training;Neuromuscular re-education;Balance training;Therapeutic exercise;Therapeutic activities;Functional mobility training;Stair training;Patient/family education;Manual techniques;Dry needling    PT Next Visit Plan work some on the core and hips    Consulted and Agree with Plan of Care Patient           Patient will benefit from skilled therapeutic intervention in order to improve the following deficits and impairments:  Abnormal gait,Decreased mobility,Decreased range of motion,Decreased strength,Difficulty walking,Impaired flexibility,Pain,Decreased balance  Visit Diagnosis: Chronic pain of right knee  Stiffness of right knee, not elsewhere classified  Acute pain of left knee  Difficulty in walking, not elsewhere classified     Problem List Patient Active Problem List   Diagnosis Date Noted  . History of total knee replacement, right 12/26/2018  . Osteoarthritis  12/26/2018  . Dermatochalasis of both upper eyelids 02/14/2018  . Posterior capsular opacification non visually significant of left eye 02/14/2018  . Pseudophakia of left eye 02/14/2018  . Estrogen deficiency 12/14/2016  . Anemia 12/14/2016  . Cortical age-related cataract of right eye 11/20/2016  . Macular hole of left eye 11/20/2016  . Nuclear sclerotic cataract of right eye 11/20/2016  . Presbyopia of both eyes 11/20/2016  . PVD (posterior vitreous detachment),  right 11/20/2016  . Vaginal dysplasia 09/20/2016  . Muscle spasm of left lower extremity 05/11/2016  . Swelling of joint of left knee 05/11/2016  . Dysplasia of cervix, low grade (CIN 1) 05/11/2016  . High risk medication use 05/11/2016  . Thyroid activity decreased 11/19/2014  . Pain, joint, multiple sites 11/19/2014  . Numbness and tingling of left leg 04/06/2014  . Routine general medical examination at a health care facility 09/29/2013  . Osteoporosis, post-menopausal 04/21/2013  . Cough variant asthma 03/17/2013  . Psoriasis 03/17/2013  . Persistent dry cough 03/17/2013  . Osteoarthritis of left knee 03/03/2013  . Psoriasis-like skin disease 03/03/2013  . Eczema 01/07/2013  . Need for prophylactic vaccination and inoculation against influenza 01/07/2013  . Prediabetes 12/17/2012  . Cough 12/17/2012  . Reactive airway disease with wheezing 12/03/2012  . URI, acute 12/03/2012  . Hypothyroidism 10/29/2012  . Unspecified vitamin D deficiency 10/29/2012  . Hyperlipidemia LDL goal <130 10/29/2012  . GERD (gastroesophageal reflux disease) 10/29/2012  . Osteoporosis 10/29/2012  . Myalgia and myositis 10/29/2012    Juel Burrow, PT, DPT 09/28/2020, 10:38 AM  [address]. [place], [place], [address] Phone: [phone]   Fax:  [phone]  Name: [name] [name] MRN: [id number] Date of Birth: March 23, 1946

## 2020-10-05 ENCOUNTER — Ambulatory Visit: Payer: Medicare Other | Admitting: Physical Therapy

## 2020-10-07 ENCOUNTER — Encounter: Payer: Self-pay | Admitting: Physical Therapy

## 2020-10-07 ENCOUNTER — Ambulatory Visit: Payer: Medicare Other | Attending: Orthopedic Surgery | Admitting: Physical Therapy

## 2020-10-07 ENCOUNTER — Other Ambulatory Visit: Payer: Self-pay

## 2020-10-07 DIAGNOSIS — G8929 Other chronic pain: Secondary | ICD-10-CM | POA: Insufficient documentation

## 2020-10-07 DIAGNOSIS — M25661 Stiffness of right knee, not elsewhere classified: Secondary | ICD-10-CM | POA: Insufficient documentation

## 2020-10-07 DIAGNOSIS — M25562 Pain in left knee: Secondary | ICD-10-CM | POA: Insufficient documentation

## 2020-10-07 DIAGNOSIS — R262 Difficulty in walking, not elsewhere classified: Secondary | ICD-10-CM | POA: Diagnosis present

## 2020-10-07 DIAGNOSIS — M25561 Pain in right knee: Secondary | ICD-10-CM | POA: Diagnosis not present

## 2020-10-07 NOTE — Therapy (Signed)
[address]. [place], [place], [address] Phone: [phone]   Fax:  [phone]  Physical Therapy Treatment  Patient Details  Name: [name] [name] MRN: [id number] Date of Birth: 04-Nov-1945 Referring Provider (PT): Fenton Foy, [place]   Encounter Date: 10/07/2020   PT End of Session - 10/07/20 1006    Visit Number 6    Date for PT Re-Evaluation 12/10/20    Authorization Type UHC Medicare    PT Start Time 0922    PT Stop Time 1005    PT Time Calculation (min) 43 min    Activity Tolerance Patient tolerated treatment well    Behavior During Therapy [facility] for tasks assessed/performed           Past Medical History:  Diagnosis Date  . Acute bronchiolitis due to other infectious organisms   . Acute bronchiolitis due to respiratory syncytial virus (RSV)   . Arthritis    OA AND PAIN RIGHT KNEE  . Degeneration of intervertebral disc, site unspecified   . Disorder of bone and cartilage, unspecified   . Disturbance of skin sensation   . Encounter for long-term (current) use of other medications   . GERD (gastroesophageal reflux disease)    PT STATES SEVERE REFLUX IF SHE DOES NOT TAKE HER OMEPRAZOLE  . Headache(784.0)    WHEN REFLUX SEVERE  . Hypothyroidism   . Long term (current) use of anticoagulants   . Osteoarthrosis, unspecified whether generalized or localized, lower leg   . Osteoporosis, unspecified   . Other and unspecified hyperlipidemia   . Other atopic dermatitis and related conditions   . Other malaise and fatigue   . Other specified pre-operative examination 11/07/2011  . Thoracic or lumbosacral neuritis or radiculitis, unspecified   . Unspecified vitamin D deficiency   . Vegetarian diet     Past Surgical History:  Procedure Laterality Date  . CERVICAL BIOPSY  W/ LOOP ELECTRODE EXCISION    . EYE SURGERY  2016   Left eye, Cataract Removal  . TEETH EXTRACTIONS--NO OTHER SURGERY    . TOTAL  KNEE ARTHROPLASTY  11/28/2011   Procedure: TOTAL KNEE ARTHROPLASTY;  Surgeon: Tobi Bastos, MD;  Location: [facility];  Service: Orthopedics;  Laterality: Right;  . TUBAL LIGATION      There were no vitals filed for this visit.   Subjective Assessment - 10/07/20 0920    Subjective reports some increase pain in hte left lateral thigh today    Currently in Pain? Yes    Pain Score 4     Pain Location Leg    Pain Orientation Left;Upper;Lateral    Pain Descriptors / Indicators Aching;Sore                             [facility] Adult PT Treatment/Exercise - 10/07/20 0001      Knee/Hip Exercises: Stretches   Passive Hamstring Stretch Both;3 reps;20 seconds    ITB Stretch Both;3 reps;20 seconds    Piriformis Stretch Both;3 reps;20 seconds      Knee/Hip Exercises: Aerobic   Nustep level 4 x 6 minutes      Knee/Hip Exercises: Standing   Hip Abduction 20 reps    Abduction Limitations 3#      Knee/Hip Exercises: Seated   Long Arc Quad Both;2 sets;10 reps    Long Arc Quad Weight 3 lbs.      Knee/Hip Exercises: Supine   Short Arc  Quad Sets Strengthening;Both;2 sets;10 reps    Short Arc [employer] Limitations 3    Bridges with [employer] 2 sets;10 reps    Bridges with Clamshell 2 sets;10 reps    Straight Leg Raises Strengthening;Both;2 sets;10 reps    Other Supine Knee/Hip Exercises feet on ball K2C, trunk rotation, needed a lot of cues to get abs to fire      Knee/Hip Exercises: Sidelying   Clams 20 reps with 3#      Manual Therapy   Manual Therapy Soft tissue mobilization    Soft tissue mobilization left ITB, posterior knee and into the gluteals                    PT Short Term Goals - 09/28/20 1035      PT SHORT TERM GOAL #1   Title independent with initial HEP    Status Achieved             PT Long Term Goals - 10/07/20 1009      PT LONG TERM GOAL #1   Title decrease pain 50%    Status Partially Met      PT LONG TERM GOAL #2   Title  increase right knee AROM to 110 degrees flexion    Status Partially Met      PT LONG TERM GOAL #3   Title go up and down stairs step over step    Status On-going      PT LONG TERM GOAL #4   Title report sleep 50% better    Status Partially Met                 Plan - 10/07/20 1007    Clinical Impression Statement Patient is unsure why but reports that she has some increased left knee pain.  She is tender lateral and medial, she is tight in the ITB and piriformis mms.  They are going to be here next week and then will be on vacation, would like to assure that they have good HEP that they can do ont he trip    PT Next Visit Plan assure that they have HEP for their trip    Consulted and Agree with Plan of Care Patient           Patient will benefit from skilled therapeutic intervention in order to improve the following deficits and impairments:  Abnormal gait,Decreased mobility,Decreased range of motion,Decreased strength,Difficulty walking,Impaired flexibility,Pain,Decreased balance  Visit Diagnosis: Chronic pain of right knee  Stiffness of right knee, not elsewhere classified  Acute pain of left knee  Difficulty in walking, not elsewhere classified     Problem List Patient Active Problem List   Diagnosis Date Noted  . History of total knee replacement, right 12/26/2018  . Osteoarthritis 12/26/2018  . Dermatochalasis of both upper eyelids 02/14/2018  . Posterior capsular opacification non visually significant of left eye 02/14/2018  . Pseudophakia of left eye 02/14/2018  . Estrogen deficiency 12/14/2016  . Anemia 12/14/2016  . Cortical age-related cataract of right eye 11/20/2016  . Macular hole of left eye 11/20/2016  . Nuclear sclerotic cataract of right eye 11/20/2016  . Presbyopia of both eyes 11/20/2016  . PVD (posterior vitreous detachment), right 11/20/2016  . Vaginal dysplasia 09/20/2016  . Muscle spasm of left lower extremity 05/11/2016  . Swelling of  joint of left knee 05/11/2016  . Dysplasia of cervix, low grade (CIN 1) 05/11/2016  . High risk medication use 05/11/2016  .  Thyroid activity decreased 11/19/2014  . Pain, joint, multiple sites 11/19/2014  . Numbness and tingling of left leg 04/06/2014  . Routine general medical examination at a health care facility 09/29/2013  . Osteoporosis, post-menopausal 04/21/2013  . Cough variant asthma 03/17/2013  . Psoriasis 03/17/2013  . Persistent dry cough 03/17/2013  . Osteoarthritis of left knee 03/03/2013  . Psoriasis-like skin disease 03/03/2013  . Eczema 01/07/2013  . Need for prophylactic vaccination and inoculation against influenza 01/07/2013  . Prediabetes 12/17/2012  . Cough 12/17/2012  . Reactive airway disease with wheezing 12/03/2012  . URI, acute 12/03/2012  . Hypothyroidism 10/29/2012  . Unspecified vitamin D deficiency 10/29/2012  . Hyperlipidemia LDL goal <130 10/29/2012  . GERD (gastroesophageal reflux disease) 10/29/2012  . Osteoporosis 10/29/2012  . Myalgia and myositis 10/29/2012    Sumner Boast., PT 10/07/2020, 10:11 AM  [address]. [place], [place], [address] Phone: [phone]   Fax:  [phone]  Name: [name] [name] MRN: [id number] Date of Birth: September 01, 1945

## 2020-10-10 ENCOUNTER — Ambulatory Visit: Payer: Medicare Other | Admitting: Rehabilitative and Restorative Service Providers"

## 2020-10-10 ENCOUNTER — Encounter: Payer: Self-pay | Admitting: Rehabilitative and Restorative Service Providers"

## 2020-10-10 ENCOUNTER — Other Ambulatory Visit: Payer: Self-pay

## 2020-10-10 DIAGNOSIS — M25661 Stiffness of right knee, not elsewhere classified: Secondary | ICD-10-CM

## 2020-10-10 DIAGNOSIS — R262 Difficulty in walking, not elsewhere classified: Secondary | ICD-10-CM

## 2020-10-10 DIAGNOSIS — M25562 Pain in left knee: Secondary | ICD-10-CM

## 2020-10-10 DIAGNOSIS — M25561 Pain in right knee: Secondary | ICD-10-CM | POA: Diagnosis not present

## 2020-10-10 DIAGNOSIS — G8929 Other chronic pain: Secondary | ICD-10-CM

## 2020-10-10 NOTE — Therapy (Signed)
[facility]. [place], [place], [address] Phone: [phone]   Fax:  [phone]  Physical Therapy Treatment  Patient Details  Name: [name] [name] MRN: [id number] Date of Birth: 31-Mar-1946 Referring Provider (PT): Fenton Foy, [place]   Encounter Date: 10/10/2020   PT End of Session - 10/10/20 0929    Visit Number 7    Date for PT Re-Evaluation 12/10/20    Authorization Type UHC Medicare    PT Start Time 0918    PT Stop Time 1005    PT Time Calculation (min) 47 min    Activity Tolerance Patient tolerated treatment well    Behavior During Therapy [facility] for tasks assessed/performed           Past Medical History:  Diagnosis Date  . Acute bronchiolitis due to other infectious organisms   . Acute bronchiolitis due to respiratory syncytial virus (RSV)   . Arthritis    OA AND PAIN RIGHT KNEE  . Degeneration of intervertebral disc, site unspecified   . Disorder of bone and cartilage, unspecified   . Disturbance of skin sensation   . Encounter for long-term (current) use of other medications   . GERD (gastroesophageal reflux disease)    PT STATES SEVERE REFLUX IF SHE DOES NOT TAKE HER OMEPRAZOLE  . Headache(784.0)    WHEN REFLUX SEVERE  . Hypothyroidism   . Long term (current) use of anticoagulants   . Osteoarthrosis, unspecified whether generalized or localized, lower leg   . Osteoporosis, unspecified   . Other and unspecified hyperlipidemia   . Other atopic dermatitis and related conditions   . Other malaise and fatigue   . Other specified pre-operative examination 11/07/2011  . Thoracic or lumbosacral neuritis or radiculitis, unspecified   . Unspecified vitamin D deficiency   . Vegetarian diet     Past Surgical History:  Procedure Laterality Date  . CERVICAL BIOPSY  W/ LOOP ELECTRODE EXCISION    . EYE SURGERY  2016   Left eye, Cataract Removal  . TEETH EXTRACTIONS--NO OTHER SURGERY    . TOTAL  KNEE ARTHROPLASTY  11/28/2011   Procedure: TOTAL KNEE ARTHROPLASTY;  Surgeon: Tobi Bastos, MD;  Location: [facility];  Service: Orthopedics;  Laterality: Right;  . TUBAL LIGATION      There were no vitals filed for this visit.   Subjective Assessment - 10/10/20 0927    Subjective Pt reports some pain on L knee, but overall states that she is feeling okay    Patient is accompained by: Family member    Patient Stated Goals have no pain, do stairs normally    Currently in Pain? Yes    Pain Score 4     Pain Location Knee    Pain Orientation Left    Pain Descriptors / Indicators Aching;Sore                             [facility] Adult PT Treatment/Exercise - 10/10/20 0001      Knee/Hip Exercises: Stretches   Active Hamstring Stretch Both;3 reps;20 seconds    Active Hamstring Stretch Limitations seated hip hinge position    ITB Stretch Both;3 reps;20 seconds    Piriformis Stretch Both;3 reps;20 seconds    Piriformis Stretch Limitations in seated hinged position    Gastroc Stretch Both;3 reps;20 seconds      Knee/Hip Exercises: Aerobic   Nustep level 4 x 6 minutes  Knee/Hip Exercises: Standing   Hip Flexion Stengthening;Both;1 set;10 reps    Hip Flexion Limitations 3#    Hip Abduction Stengthening;Both;1 set;10 reps    Abduction Limitations 3#    Hip Extension Stengthening;Both;1 set;10 reps    Extension Limitations 3#    Lateral Step Up Both;1 set;10 reps;Hand Hold: 1;Step Height: 4"    Forward Step Up Both;1 set;10 reps;Hand Hold: 2;Step Height: 4"      Knee/Hip Exercises: Seated   Long Arc Quad Both;2 sets;10 reps    Long Arc Quad Weight 3 lbs.      Manual Therapy   Manual Therapy Soft tissue mobilization;Myofascial release    Soft tissue mobilization left ITB, posterior knee and into the gluteals                    PT Short Term Goals - 09/28/20 1035      PT SHORT TERM GOAL #1   Title independent with initial HEP    Status Achieved              PT Long Term Goals - 10/07/20 1009      PT LONG TERM GOAL #1   Title decrease pain 50%    Status Partially Met      PT LONG TERM GOAL #2   Title increase right knee AROM to 110 degrees flexion    Status Partially Met      PT LONG TERM GOAL #3   Title go up and down stairs step over step    Status On-going      PT LONG TERM GOAL #4   Title report sleep 50% better    Status Partially Met                 Plan - 10/10/20 1008    Clinical Impression Statement Patient continues to progress towards goal related activities.  She continues to have tightness in L ITB and reports feeling better after manual therapy to L lateral thigh.  Provided pt with some of the same stretches that she had been doing in sitting to allow her a different stretch pending which works better on [place].  She continues to perform HEP and was able to tolerate new strengthening exercises today.    PT Treatment/Interventions ADLs/Self Care Home Management;Cryotherapy;Electrical Stimulation;Iontophoresis 40m/ml Dexamethasone;Moist Heat;Ultrasound;Gait training;Neuromuscular re-education;Balance training;Therapeutic exercise;Therapeutic activities;Functional mobility training;Stair training;Patient/family education;Manual techniques;Dry needling    PT Next Visit Plan assure that they have HEP for their trip    Consulted and Agree with Plan of Care Patient           Patient will benefit from skilled therapeutic intervention in order to improve the following deficits and impairments:  Abnormal gait,Decreased mobility,Decreased range of motion,Decreased strength,Difficulty walking,Impaired flexibility,Pain,Decreased balance  Visit Diagnosis: Chronic pain of right knee  Stiffness of right knee, not elsewhere classified  Acute pain of left knee  Difficulty in walking, not elsewhere classified     Problem List Patient Active Problem List   Diagnosis Date Noted  . History of total knee  replacement, right 12/26/2018  . Osteoarthritis 12/26/2018  . Dermatochalasis of both upper eyelids 02/14/2018  . Posterior capsular opacification non visually significant of left eye 02/14/2018  . Pseudophakia of left eye 02/14/2018  . Estrogen deficiency 12/14/2016  . Anemia 12/14/2016  . Cortical age-related cataract of right eye 11/20/2016  . Macular hole of left eye 11/20/2016  . Nuclear sclerotic cataract of right eye 11/20/2016  . Presbyopia of  both eyes 11/20/2016  . PVD (posterior vitreous detachment), right 11/20/2016  . Vaginal dysplasia 09/20/2016  . Muscle spasm of left lower extremity 05/11/2016  . Swelling of joint of left knee 05/11/2016  . Dysplasia of cervix, low grade (CIN 1) 05/11/2016  . High risk medication use 05/11/2016  . Thyroid activity decreased 11/19/2014  . Pain, joint, multiple sites 11/19/2014  . Numbness and tingling of left leg 04/06/2014  . Routine general medical examination at a health care facility 09/29/2013  . Osteoporosis, post-menopausal 04/21/2013  . Cough variant asthma 03/17/2013  . Psoriasis 03/17/2013  . Persistent dry cough 03/17/2013  . Osteoarthritis of left knee 03/03/2013  . Psoriasis-like skin disease 03/03/2013  . Eczema 01/07/2013  . Need for prophylactic vaccination and inoculation against influenza 01/07/2013  . Prediabetes 12/17/2012  . Cough 12/17/2012  . Reactive airway disease with wheezing 12/03/2012  . URI, acute 12/03/2012  . Hypothyroidism 10/29/2012  . Unspecified vitamin D deficiency 10/29/2012  . Hyperlipidemia LDL goal <130 10/29/2012  . GERD (gastroesophageal reflux disease) 10/29/2012  . Osteoporosis 10/29/2012  . Myalgia and myositis 10/29/2012    Juel Burrow, PT, DPT 10/10/2020, 10:12 AM  [address]. [place], [place], [address] Phone: [phone]   Fax:  [phone]  Name: [name] [name] MRN: [id number] Date of Birth:  1946/03/03

## 2020-10-12 ENCOUNTER — Encounter: Payer: Self-pay | Admitting: Rehabilitative and Restorative Service Providers"

## 2020-10-12 ENCOUNTER — Other Ambulatory Visit: Payer: Self-pay

## 2020-10-12 ENCOUNTER — Ambulatory Visit: Payer: Medicare Other | Admitting: Rehabilitative and Restorative Service Providers"

## 2020-10-12 DIAGNOSIS — R262 Difficulty in walking, not elsewhere classified: Secondary | ICD-10-CM

## 2020-10-12 DIAGNOSIS — M25661 Stiffness of right knee, not elsewhere classified: Secondary | ICD-10-CM

## 2020-10-12 DIAGNOSIS — M25562 Pain in left knee: Secondary | ICD-10-CM

## 2020-10-12 DIAGNOSIS — M25561 Pain in right knee: Secondary | ICD-10-CM | POA: Diagnosis not present

## 2020-10-12 DIAGNOSIS — G8929 Other chronic pain: Secondary | ICD-10-CM

## 2020-10-12 NOTE — Therapy (Signed)
[place]. [place], [place], [address] Phone: [phone]   Fax:  [phone]  Physical Therapy Treatment  Patient Details  Name: [name] [name] MRN: [id number] Date of Birth: 01-Apr-1946 Referring Provider (PT): Fenton Foy, [place]   Encounter Date: 10/12/2020   PT End of Session - 10/12/20 0937    Visit Number 8    Date for PT Re-Evaluation 12/10/20    Authorization Type [employer] Medicare    PT Start Time 0930    PT Stop Time 1015    PT Time Calculation (min) 45 min    Activity Tolerance Patient tolerated treatment well    Behavior During Therapy [facility] for tasks assessed/performed           Past Medical History:  Diagnosis Date  . Acute bronchiolitis due to other infectious organisms   . Acute bronchiolitis due to respiratory syncytial virus (RSV)   . Arthritis    OA AND PAIN RIGHT KNEE  . Degeneration of intervertebral disc, site unspecified   . Disorder of bone and cartilage, unspecified   . Disturbance of skin sensation   . Encounter for long-term (current) use of other medications   . GERD (gastroesophageal reflux disease)    PT STATES SEVERE REFLUX IF SHE DOES NOT TAKE HER OMEPRAZOLE  . Headache(784.0)    WHEN REFLUX SEVERE  . Hypothyroidism   . Long term (current) use of anticoagulants   . Osteoarthrosis, unspecified whether generalized or localized, lower leg   . Osteoporosis, unspecified   . Other and unspecified hyperlipidemia   . Other atopic dermatitis and related conditions   . Other malaise and fatigue   . Other specified pre-operative examination 11/07/2011  . Thoracic or lumbosacral neuritis or radiculitis, unspecified   . Unspecified vitamin D deficiency   . Vegetarian diet     Past Surgical History:  Procedure Laterality Date  . CERVICAL BIOPSY  W/ LOOP ELECTRODE EXCISION    . EYE SURGERY  2016   Left eye, Cataract Removal  . TEETH EXTRACTIONS--NO OTHER SURGERY    . TOTAL  KNEE ARTHROPLASTY  11/28/2011   Procedure: TOTAL KNEE ARTHROPLASTY;  Surgeon: Tobi Bastos, MD;  Location: [facility];  Service: Orthopedics;  Laterality: Right;  . TUBAL LIGATION      There were no vitals filed for this visit.   Subjective Assessment - 10/12/20 0936    Subjective Pt reports that she is feeling better, she started using a massage machine on her IT band    Patient Stated Goals have no pain, do stairs normally    Currently in Pain? Yes    Pain Score 4     Pain Location Knee    Pain Orientation Left    Pain Descriptors / Indicators Aching              [facility] PT Assessment - 10/12/20 0001      Observation/Other Assessments   Focus on Therapeutic Outcomes (FOTO)  76%                         [facility] Adult PT Treatment/Exercise - 10/12/20 0001      Knee/Hip Exercises: Stretches   Passive Hamstring Stretch Both;3 reps;20 seconds    ITB Stretch Both;3 reps;20 seconds    Piriformis Stretch Left;3 reps;20 seconds    Piriformis Stretch Limitations in seated hinged position    Gastroc Stretch Both;3 reps;20 seconds  Knee/Hip Exercises: Aerobic   Nustep level 4 x 6 minutes LE only      Knee/Hip Exercises: Machines for Strengthening   Cybex Knee Extension 5# 2x10    Cybex Knee Flexion 10# 2x10      Knee/Hip Exercises: Standing   Heel Raises Both;2 sets;10 reps    Forward Step Up Both;1 set;10 reps;Hand Hold: 2;Step Height: 4"    Forward Step Up Limitations with alt LE extension    Walking with Sports Cord 20#, 4 way      Manual Therapy   Manual Therapy Soft tissue mobilization    Soft tissue mobilization left ITB, posterior knee and into the gluteals                    PT Short Term Goals - 09/28/20 1035      PT SHORT TERM GOAL #1   Title independent with initial HEP    Status Achieved             PT Long Term Goals - 10/12/20 1038      PT LONG TERM GOAL #1   Title decrease pain 50%    Status Partially Met      PT LONG  TERM GOAL #3   Title go up and down stairs step over step    Status On-going      PT LONG TERM GOAL #4   Title report sleep 50% better    Status Partially Met                 Plan - 10/12/20 1020    Clinical Impression Statement Patient continues to make progress towards goals and did not notice trigger points to L IT Band today with pt reporting that she is feeling better.  Patient was able to start with some weight machines today.  She requires cuing for technique during ther ex for improved technique.  L piriformis continues to be tighter than R side.  FOTO score has improved greatly since initial evaluation.  Pt will return in approx a month following her [place] cruise and trip to [place].    PT Treatment/Interventions ADLs/Self Care Home Management;Cryotherapy;Electrical Stimulation;Iontophoresis 27m/ml Dexamethasone;Moist Heat;Ultrasound;Gait training;Neuromuscular re-education;Balance training;Therapeutic exercise;Therapeutic activities;Functional mobility training;Stair training;Patient/family education;Manual techniques;Dry needling    PT Next Visit Plan Assess progress following vacation    Consulted and Agree with Plan of Care Patient           Patient will benefit from skilled therapeutic intervention in order to improve the following deficits and impairments:  Abnormal gait,Decreased mobility,Decreased range of motion,Decreased strength,Difficulty walking,Impaired flexibility,Pain,Decreased balance  Visit Diagnosis: Acute pain of left knee  Difficulty in walking, not elsewhere classified  Stiffness of right knee, not elsewhere classified  Chronic pain of right knee     Problem List Patient Active Problem List   Diagnosis Date Noted  . History of total knee replacement, right 12/26/2018  . Osteoarthritis 12/26/2018  . Dermatochalasis of both upper eyelids 02/14/2018  . Posterior capsular opacification non visually significant of left eye 02/14/2018  .  Pseudophakia of left eye 02/14/2018  . Estrogen deficiency 12/14/2016  . Anemia 12/14/2016  . Cortical age-related cataract of right eye 11/20/2016  . Macular hole of left eye 11/20/2016  . Nuclear sclerotic cataract of right eye 11/20/2016  . Presbyopia of both eyes 11/20/2016  . PVD (posterior vitreous detachment), right 11/20/2016  . Vaginal dysplasia 09/20/2016  . Muscle spasm of left lower extremity 05/11/2016  . Swelling of  joint of left knee 05/11/2016  . Dysplasia of cervix, low grade (CIN 1) 05/11/2016  . High risk medication use 05/11/2016  . Thyroid activity decreased 11/19/2014  . Pain, joint, multiple sites 11/19/2014  . Numbness and tingling of left leg 04/06/2014  . Routine general medical examination at a health care facility 09/29/2013  . Osteoporosis, post-menopausal 04/21/2013  . Cough variant asthma 03/17/2013  . Psoriasis 03/17/2013  . Persistent dry cough 03/17/2013  . Osteoarthritis of left knee 03/03/2013  . Psoriasis-like skin disease 03/03/2013  . Eczema 01/07/2013  . Need for prophylactic vaccination and inoculation against influenza 01/07/2013  . Prediabetes 12/17/2012  . Cough 12/17/2012  . Reactive airway disease with wheezing 12/03/2012  . URI, acute 12/03/2012  . Hypothyroidism 10/29/2012  . Unspecified vitamin D deficiency 10/29/2012  . Hyperlipidemia LDL goal <130 10/29/2012  . GERD (gastroesophageal reflux disease) 10/29/2012  . Osteoporosis 10/29/2012  . Myalgia and myositis 10/29/2012    Juel Burrow, PT, DPT 10/12/2020, 10:40 AM  [place]. [place], [place], [address] Phone: [phone]   Fax:  [phone]  Name: Kasumi Ditullio MRN: [id number] Date of Birth: 01/13/1946

## 2020-11-09 ENCOUNTER — Ambulatory Visit: Payer: Medicare Other | Attending: Orthopedic Surgery | Admitting: Rehabilitative and Restorative Service Providers"

## 2020-11-09 ENCOUNTER — Other Ambulatory Visit: Payer: Self-pay

## 2020-11-09 ENCOUNTER — Encounter: Payer: Self-pay | Admitting: Rehabilitative and Restorative Service Providers"

## 2020-11-09 DIAGNOSIS — M25561 Pain in right knee: Secondary | ICD-10-CM | POA: Diagnosis present

## 2020-11-09 DIAGNOSIS — M25661 Stiffness of right knee, not elsewhere classified: Secondary | ICD-10-CM | POA: Diagnosis present

## 2020-11-09 DIAGNOSIS — M25562 Pain in left knee: Secondary | ICD-10-CM | POA: Insufficient documentation

## 2020-11-09 DIAGNOSIS — G8929 Other chronic pain: Secondary | ICD-10-CM | POA: Diagnosis present

## 2020-11-09 DIAGNOSIS — R262 Difficulty in walking, not elsewhere classified: Secondary | ICD-10-CM | POA: Insufficient documentation

## 2020-11-09 NOTE — Therapy (Signed)
[address]. [place], [place], [address] Phone: [phone]   Fax:  [phone]  Physical Therapy Treatment  Patient Details  Name: [name] [name] MRN: [id number] Date of Birth: 02/04/1946 Referring Provider (PT): Fenton Foy, [place]   Encounter Date: 11/09/2020   PT End of Session - 11/09/20 0940     Visit Number 9    Date for PT Re-Evaluation 12/10/20    Authorization Type UHC Medicare    PT Start Time 0930    PT Stop Time 1010    PT Time Calculation (min) 40 min    Activity Tolerance Patient tolerated treatment well    Behavior During Therapy [facility] for tasks assessed/performed             Past Medical History:  Diagnosis Date   Acute bronchiolitis due to other infectious organisms    Acute bronchiolitis due to respiratory syncytial virus (RSV)    Arthritis    OA AND PAIN RIGHT KNEE   Degeneration of intervertebral disc, site unspecified    Disorder of bone and cartilage, unspecified    Disturbance of skin sensation    Encounter for long-term (current) use of other medications    GERD (gastroesophageal reflux disease)    PT STATES SEVERE REFLUX IF SHE DOES NOT TAKE HER OMEPRAZOLE   Headache(784.0)    WHEN REFLUX SEVERE   Hypothyroidism    Long term (current) use of anticoagulants    Osteoarthrosis, unspecified whether generalized or localized, lower leg    Osteoporosis, unspecified    Other and unspecified hyperlipidemia    Other atopic dermatitis and related conditions    Other malaise and fatigue    Other specified pre-operative examination 11/07/2011   Thoracic or lumbosacral neuritis or radiculitis, unspecified    Unspecified vitamin D deficiency    Vegetarian diet     Past Surgical History:  Procedure Laterality Date   CERVICAL BIOPSY  W/ LOOP ELECTRODE EXCISION     EYE SURGERY  2016   Left eye, Cataract Removal   TEETH EXTRACTIONS--NO OTHER SURGERY     TOTAL KNEE ARTHROPLASTY   11/28/2011   Procedure: TOTAL KNEE ARTHROPLASTY;  Surgeon: Tobi Bastos, MD;  Location: [facility];  Service: Orthopedics;  Laterality: Right;   TUBAL LIGATION      There were no vitals filed for this visit.   Subjective Assessment - 11/09/20 0939     Subjective Pt reports that she did well on her vacation, but is still having some L lateral thigh pain.    Patient is accompained by: Family member;Interpreter    Patient Stated Goals have no pain, do stairs normally    Currently in Pain? Yes    Pain Score 5     Pain Location Knee    Pain Orientation Left    Pain Descriptors / Indicators Aching                               [facility] Adult PT Treatment/Exercise - 11/09/20 0001       Knee/Hip Exercises: Aerobic   Nustep level 4 x 6 minutes      Knee/Hip Exercises: Standing   Hip Flexion Stengthening;Both;1 set;10 reps    Hip Flexion Limitations 3#    Hip Abduction Stengthening;Both;1 set;10 reps    Abduction Limitations 3#    Hip Extension Stengthening;Both;1 set;10 reps    Extension Limitations 3#  Lateral Step Up Both;1 set;10 reps;Hand Hold: 1;Step Height: 4"    Forward Step Up Both;1 set;10 reps;Hand Hold: 2;Step Height: 4"      Knee/Hip Exercises: Seated   Long Arc Quad Both;2 sets;10 reps    Long Arc Quad Weight 3 lbs.    Marching Strengthening;Both;2 sets;10 reps    Marching Weights 3 lbs.      Manual Therapy   Manual Therapy Soft tissue mobilization;Taping    Soft tissue mobilization left ITB, posterior knee and into the gluteals      Kinesiotix   Create Space Kinesiotape applied to L IT band and L quad with Y strip around patella to increase space and decrease pain                      PT Short Term Goals - 09/28/20 1035       PT SHORT TERM GOAL #1   Title independent with initial HEP    Status Achieved               PT Long Term Goals - 10/12/20 1038       PT LONG TERM GOAL #1   Title decrease pain 50%    Status  Partially Met      PT LONG TERM GOAL #3   Title go up and down stairs step over step    Status On-going      PT LONG TERM GOAL #4   Title report sleep 50% better    Status Partially Met                   Plan - 11/09/20 1040     Clinical Impression Statement Mrs Callari had some increased fatigue after being on vacation and missing therapy for a couple weeks due to vacation.  She continues to have trigger points noted on L lateral thigh in IT band region.  Overall, she tolerated session well, but did have some knee pain.  Kinsesiotape applied to L knee and lateral thigh for pain relief, pt and husband educated about the application.    PT Treatment/Interventions ADLs/Self Care Home Management;Cryotherapy;Electrical Stimulation;Iontophoresis 54m/ml Dexamethasone;Moist Heat;Ultrasound;Gait training;Neuromuscular re-education;Balance training;Therapeutic exercise;Therapeutic activities;Functional mobility training;Stair training;Patient/family education;Manual techniques;Dry needling    Consulted and Agree with Plan of Care Patient;Family member/caregiver             Patient will benefit from skilled therapeutic intervention in order to improve the following deficits and impairments:  Abnormal gait, Decreased mobility, Decreased range of motion, Decreased strength, Difficulty walking, Impaired flexibility, Pain, Decreased balance  Visit Diagnosis: Acute pain of left knee  Difficulty in walking, not elsewhere classified  Stiffness of right knee, not elsewhere classified  Chronic pain of right knee     Problem List Patient Active Problem List   Diagnosis Date Noted   History of total knee replacement, right 12/26/2018   Osteoarthritis 12/26/2018   Dermatochalasis of both upper eyelids 02/14/2018   Posterior capsular opacification non visually significant of left eye 02/14/2018   Pseudophakia of left eye 02/14/2018   Estrogen deficiency 12/14/2016   Anemia 12/14/2016    Cortical age-related cataract of right eye 11/20/2016   Macular hole of left eye 11/20/2016   Nuclear sclerotic cataract of right eye 11/20/2016   Presbyopia of both eyes 11/20/2016   PVD (posterior vitreous detachment), right 11/20/2016   Vaginal dysplasia 09/20/2016   Muscle spasm of left lower extremity 05/11/2016   Swelling of joint of left knee  05/11/2016   Dysplasia of cervix, low grade (CIN 1) 05/11/2016   High risk medication use 05/11/2016   Thyroid activity decreased 11/19/2014   Pain, joint, multiple sites 11/19/2014   Numbness and tingling of left leg 04/06/2014   Routine general medical examination at a health care facility 09/29/2013   Osteoporosis, post-menopausal 04/21/2013   Cough variant asthma 03/17/2013   Psoriasis 03/17/2013   Persistent dry cough 03/17/2013   Osteoarthritis of left knee 03/03/2013   Psoriasis-like skin disease 03/03/2013   Eczema 01/07/2013   Need for prophylactic vaccination and inoculation against influenza 01/07/2013   Prediabetes 12/17/2012   Cough 12/17/2012   Reactive airway disease with wheezing 12/03/2012   URI, acute 12/03/2012   Hypothyroidism 10/29/2012   Unspecified vitamin D deficiency 10/29/2012   Hyperlipidemia LDL goal <130 10/29/2012   GERD (gastroesophageal reflux disease) 10/29/2012   Osteoporosis 10/29/2012   Myalgia and myositis 10/29/2012    Juel Burrow, PT, DPT 11/09/2020, 10:47 AM  [address]. [place], [place], [address] Phone: [phone]   Fax:  [phone]  Name: Jing Howatt MRN: [id number] Date of Birth: February 07, 1946

## 2020-11-11 ENCOUNTER — Other Ambulatory Visit: Payer: Self-pay

## 2020-11-11 ENCOUNTER — Ambulatory Visit: Payer: Medicare Other | Admitting: Rehabilitative and Restorative Service Providers"

## 2020-11-11 DIAGNOSIS — G8929 Other chronic pain: Secondary | ICD-10-CM

## 2020-11-11 DIAGNOSIS — R262 Difficulty in walking, not elsewhere classified: Secondary | ICD-10-CM

## 2020-11-11 DIAGNOSIS — M25661 Stiffness of right knee, not elsewhere classified: Secondary | ICD-10-CM

## 2020-11-11 DIAGNOSIS — M25562 Pain in left knee: Secondary | ICD-10-CM | POA: Diagnosis not present

## 2020-11-11 NOTE — Therapy (Signed)
[place]. [place], [place], [address] Phone: [phone]   Fax:  [phone]  Physical Therapy Treatment  Patient Details  Name: [name] [name] MRN: [id number] Date of Birth: Jan 14, 1946 Referring Provider (PT): Fenton Foy, [place]   Encounter Date: 11/11/2020   PT End of Session - 11/11/20 0949     Visit Number 10    Date for PT Re-Evaluation 12/10/20    Authorization Type UHC Medicare    PT Start Time 0914    PT Stop Time 0956    PT Time Calculation (min) 42 min    Activity Tolerance Patient tolerated treatment well    Behavior During Therapy [facility] for tasks assessed/performed             Past Medical History:  Diagnosis Date   Acute bronchiolitis due to other infectious organisms    Acute bronchiolitis due to respiratory syncytial virus (RSV)    Arthritis    OA AND PAIN RIGHT KNEE   Degeneration of intervertebral disc, site unspecified    Disorder of bone and cartilage, unspecified    Disturbance of skin sensation    Encounter for long-term (current) use of other medications    GERD (gastroesophageal reflux disease)    PT STATES SEVERE REFLUX IF SHE DOES NOT TAKE HER OMEPRAZOLE   Headache(784.0)    WHEN REFLUX SEVERE   Hypothyroidism    Long term (current) use of anticoagulants    Osteoarthrosis, unspecified whether generalized or localized, lower leg    Osteoporosis, unspecified    Other and unspecified hyperlipidemia    Other atopic dermatitis and related conditions    Other malaise and fatigue    Other specified pre-operative examination 11/07/2011   Thoracic or lumbosacral neuritis or radiculitis, unspecified    Unspecified vitamin D deficiency    Vegetarian diet     Past Surgical History:  Procedure Laterality Date   CERVICAL BIOPSY  W/ LOOP ELECTRODE EXCISION     EYE SURGERY  2016   Left eye, Cataract Removal   TEETH EXTRACTIONS--NO OTHER SURGERY     TOTAL KNEE ARTHROPLASTY   11/28/2011   Procedure: TOTAL KNEE ARTHROPLASTY;  Surgeon: Tobi Bastos, MD;  Location: [facility];  Service: Orthopedics;  Laterality: Right;   TUBAL LIGATION      There were no vitals filed for this visit.   Subjective Assessment - 11/11/20 0948     Subjective Pt denies pain, just states that she is only having tingling    Currently in Pain? No/denies                [facility] PT Assessment - 11/11/20 0001       AROM   Right Knee Extension 0    Right Knee Flexion 105    Left Knee Extension 0    Left Knee Flexion 133      Strength   Right Knee Flexion 4/5    Right Knee Extension 4+/5    Left Knee Flexion 4/5    Left Knee Extension 4+/5                           [facility] Adult PT Treatment/Exercise - 11/11/20 0001       Knee/Hip Exercises: Aerobic   Nustep level 4 x 6 minutes LE only      Knee/Hip Exercises: Seated   Long Arc Quad Both;2 sets;10 reps    Long [employer]  Weight 5 lbs.    Marching Strengthening;Both;2 sets;10 reps    Marching Weights 5 lbs.    Abduction/Adduction  Strengthening;Both;2 sets;10 reps    Abd/Adduction Weights 5 lbs.      Knee/Hip Exercises: Supine   Bridges Both;2 sets;10 reps    Bridges Limitations on green theraball    Straight Leg Raises Strengthening;Both;2 sets;10 reps    Straight Leg Raises Limitations 3#    Other Supine Knee/Hip Exercises feet on ball K2C, trunk rotation, needed a lot of cues to get abs to fire      Knee/Hip Exercises: Prone   Hip Extension Both;2 sets;10 reps      Manual Therapy   Manual Therapy Soft tissue mobilization;Taping    Soft tissue mobilization left ITB, posterior knee and into the gluteals      Kinesiotix   Create Space Kinesiotape applied to L IT band and L quad with Y strip around patella to increase space and decrease pain                      PT Short Term Goals - 09/28/20 1035       PT SHORT TERM GOAL #1   Title independent with initial HEP    Status Achieved                PT Long Term Goals - 11/11/20 1029       PT LONG TERM GOAL #1   Title decrease pain 50%    Status Partially Met      PT LONG TERM GOAL #2   Title increase right knee AROM to 110 degrees flexion    Status Achieved      PT LONG TERM GOAL #3   Title go up and down stairs step over step    Status On-going      PT LONG TERM GOAL #4   Title report sleep 50% better    Status Partially Met                   Plan - 11/11/20 1011     Clinical Impression Statement [name] [name] has reported decreased pain and is making progress towards ROM and strength.  She continues to have tingling in her R thigh, but it is improving.  Pt requested kinesiotape be reapplied.  She has progressed with goal related activities and continues to have improved flexibility.  Pt continues with difficulty with core stability with mat exercises and requires cuing for technique.  [name] [name] continues to require skilled outpatient PT to continue to progress towards goal related activities.    PT Frequency 2x / week    PT Duration 12 weeks    PT Treatment/Interventions ADLs/Self Care Home Management;Cryotherapy;Electrical Stimulation;Iontophoresis 14m/ml Dexamethasone;Moist Heat;Ultrasound;Gait training;Neuromuscular re-education;Balance training;Therapeutic exercise;Therapeutic activities;Functional mobility training;Stair training;Patient/family education;Manual techniques;Dry needling    PT Next Visit Plan Progress with LE strengthening and core stability    Consulted and Agree with Plan of Care Patient;Family member/caregiver             Patient will benefit from skilled therapeutic intervention in order to improve the following deficits and impairments:  Abnormal gait, Decreased mobility, Decreased range of motion, Decreased strength, Difficulty walking, Impaired flexibility, Pain, Decreased balance  Visit Diagnosis: Acute pain of left knee  Difficulty in walking, not elsewhere  classified  Stiffness of right knee, not elsewhere classified  Chronic pain of right knee     Problem List Patient Active Problem List  Diagnosis Date Noted   History of total knee replacement, right 12/26/2018   Osteoarthritis 12/26/2018   Dermatochalasis of both upper eyelids 02/14/2018   Posterior capsular opacification non visually significant of left eye 02/14/2018   Pseudophakia of left eye 02/14/2018   Estrogen deficiency 12/14/2016   Anemia 12/14/2016   Cortical age-related cataract of right eye 11/20/2016   Macular hole of left eye 11/20/2016   Nuclear sclerotic cataract of right eye 11/20/2016   Presbyopia of both eyes 11/20/2016   PVD (posterior vitreous detachment), right 11/20/2016   Vaginal dysplasia 09/20/2016   Muscle spasm of left lower extremity 05/11/2016   Swelling of joint of left knee 05/11/2016   Dysplasia of cervix, low grade (CIN 1) 05/11/2016   High risk medication use 05/11/2016   Thyroid activity decreased 11/19/2014   Pain, joint, multiple sites 11/19/2014   Numbness and tingling of left leg 04/06/2014   Routine general medical examination at a health care facility 09/29/2013   Osteoporosis, post-menopausal 04/21/2013   Cough variant asthma 03/17/2013   Psoriasis 03/17/2013   Persistent dry cough 03/17/2013   Osteoarthritis of left knee 03/03/2013   Psoriasis-like skin disease 03/03/2013   Eczema 01/07/2013   Need for prophylactic vaccination and inoculation against influenza 01/07/2013   Prediabetes 12/17/2012   Cough 12/17/2012   Reactive airway disease with wheezing 12/03/2012   URI, acute 12/03/2012   Hypothyroidism 10/29/2012   Unspecified vitamin D deficiency 10/29/2012   Hyperlipidemia LDL goal <130 10/29/2012   GERD (gastroesophageal reflux disease) 10/29/2012   Osteoporosis 10/29/2012   Myalgia and myositis 10/29/2012    Juel Burrow, PT, DPT 11/11/2020, 10:32 AM  [place]. [place], [place], [address] Phone: [phone]   Fax:  [phone]  Name: [name] [name] MRN: [id number] Date of Birth: Mar 09, 1946

## 2020-11-14 ENCOUNTER — Other Ambulatory Visit: Payer: Self-pay

## 2020-11-14 ENCOUNTER — Ambulatory Visit: Payer: Medicare Other | Admitting: Physical Therapy

## 2020-11-14 ENCOUNTER — Encounter: Payer: Self-pay | Admitting: Physical Therapy

## 2020-11-14 DIAGNOSIS — R262 Difficulty in walking, not elsewhere classified: Secondary | ICD-10-CM

## 2020-11-14 DIAGNOSIS — M25562 Pain in left knee: Secondary | ICD-10-CM

## 2020-11-14 DIAGNOSIS — M25661 Stiffness of right knee, not elsewhere classified: Secondary | ICD-10-CM

## 2020-11-14 DIAGNOSIS — G8929 Other chronic pain: Secondary | ICD-10-CM

## 2020-11-14 NOTE — Therapy (Signed)
[place] [place], [place], [address] Phone: [phone]   Fax:  [phone]  Physical Therapy Treatment  Patient Details  Name: [name] [name] MRN: [id number] Date of Birth: 1945-08-18 Referring Provider (PT): Fenton Foy, [place]   Encounter Date: 11/14/2020   PT End of Session - 11/14/20 1149     Visit Number 11    Date for PT Re-Evaluation 12/10/20    Authorization Type U[employer] Medicare    PT Start Time 0930    PT Stop Time 0962    PT Time Calculation (min) 45 min    Activity Tolerance Patient tolerated treatment well    Behavior During Therapy WFL for tasks assessed/performed             Past Medical History:  Diagnosis Date   Acute bronchiolitis due to other infectious organisms    Acute bronchiolitis due to respiratory syncytial virus (RSV)    Arthritis    OA AND PAIN RIGHT KNEE   Degeneration of intervertebral disc, site unspecified    Disorder of bone and cartilage, unspecified    Disturbance of skin sensation    Encounter for long-term (current) use of other medications    GERD (gastroesophageal reflux disease)    PT STATES SEVERE REFLUX IF SHE DOES NOT TAKE HER OMEPRAZOLE   Headache(784.0)    WHEN REFLUX SEVERE   Hypothyroidism    Long term (current) use of anticoagulants    Osteoarthrosis, unspecified whether generalized or localized, lower leg    Osteoporosis, unspecified    Other and unspecified hyperlipidemia    Other atopic dermatitis and related conditions    Other malaise and fatigue    Other specified pre-operative examination 11/07/2011   Thoracic or lumbosacral neuritis or radiculitis, unspecified    Unspecified vitamin D deficiency    Vegetarian diet     Past Surgical History:  Procedure Laterality Date   CERVICAL BIOPSY  W/ LOOP ELECTRODE EXCISION     EYE SURGERY  2016   Left eye, Cataract Removal   TEETH EXTRACTIONS--NO OTHER SURGERY     TOTAL KNEE ARTHROPLASTY   11/28/2011   Procedure: TOTAL KNEE ARTHROPLASTY;  Surgeon: Tobi Bastos, MD;  Location: [facility];  Service: Orthopedics;  Laterality: Right;   TUBAL LIGATION      There were no vitals filed for this visit.   Subjective Assessment - 11/14/20 1146     Subjective Reports only issue is at night with her lying on her back, she has the tingling and some discomfort, once awake no other reall issues with pain or tingling    Currently in Pain? No/denies                               [facility] Adult PT Treatment/Exercise - 11/14/20 0001       Knee/Hip Exercises: Stretches   Passive Hamstring Stretch Both;3 reps;20 seconds      Knee/Hip Exercises: Aerobic   Nustep level 5 x 6 minutes LE only      Knee/Hip Exercises: Machines for Strengthening   Cybex Knee Extension 5# 2x10    Cybex Knee Flexion 10# 2x10      Knee/Hip Exercises: Standing   Hip Abduction Stengthening;Both;1 set;10 reps    Abduction Limitations 3#    Hip Extension Stengthening;Both;1 set;10 reps    Extension Limitations 3#    Walking with Sports Cord 20#, 4 way  Knee/Hip Exercises: Supine   Bridges with [employer] 20 reps    Bridges with Clamshell 20 reps    Single Leg Bridge 10 reps    Other Supine Knee/Hip Exercises feet on ball K2C, trunk rotation, needed a lot of cues to get abs to fire      Manual Therapy   Manual Therapy Muscle Energy Technique    Muscle Energy Technique MET for left innominate rotation                      PT Short Term Goals - 09/28/20 1035       PT SHORT TERM GOAL #1   Title independent with initial HEP    Status Achieved               PT Long Term Goals - 11/11/20 1029       PT LONG TERM GOAL #1   Title decrease pain 50%    Status Partially Met      PT LONG TERM GOAL #2   Title increase right knee AROM to 110 degrees flexion    Status Achieved      PT LONG TERM GOAL #3   Title go up and down stairs step over step    Status  On-going      PT LONG TERM GOAL #4   Title report sleep 50% better    Status Partially Met                   Plan - 11/14/20 1150     Clinical Impression Statement She continues to report tingling only at night, I had a new PT here and had her look at [facility], felt like there was a rotation, we did some MET to fix this and it seemed to have = leg length after.  Will recheck this next visit and may teach her    PT Next Visit Plan assess the SI    Consulted and Agree with Plan of Care Patient;Family member/caregiver             Patient will benefit from skilled therapeutic intervention in order to improve the following deficits and impairments:  Abnormal gait, Decreased mobility, Decreased range of motion, Decreased strength, Difficulty walking, Impaired flexibility, Pain, Decreased balance  Visit Diagnosis: Acute pain of left knee  Difficulty in walking, not elsewhere classified  Stiffness of right knee, not elsewhere classified  Chronic pain of right knee     Problem List Patient Active Problem List   Diagnosis Date Noted   History of total knee replacement, right 12/26/2018   Osteoarthritis 12/26/2018   Dermatochalasis of both upper eyelids 02/14/2018   Posterior capsular opacification non visually significant of left eye 02/14/2018   Pseudophakia of left eye 02/14/2018   Estrogen deficiency 12/14/2016   Anemia 12/14/2016   Cortical age-related cataract of right eye 11/20/2016   Macular hole of left eye 11/20/2016   Nuclear sclerotic cataract of right eye 11/20/2016   Presbyopia of both eyes 11/20/2016   PVD (posterior vitreous detachment), right 11/20/2016   Vaginal dysplasia 09/20/2016   Muscle spasm of left lower extremity 05/11/2016   Swelling of joint of left knee 05/11/2016   Dysplasia of cervix, low grade (CIN 1) 05/11/2016   High risk medication use 05/11/2016   Thyroid activity decreased 11/19/2014   Pain, joint, multiple sites 11/19/2014    Numbness and tingling of left leg 04/06/2014   Routine general medical examination at a  health care facility 09/29/2013   Osteoporosis, post-menopausal 04/21/2013   Cough variant asthma 03/17/2013   Psoriasis 03/17/2013   Persistent dry cough 03/17/2013   Osteoarthritis of left knee 03/03/2013   Psoriasis-like skin disease 03/03/2013   Eczema 01/07/2013   Need for prophylactic vaccination and inoculation against influenza 01/07/2013   Prediabetes 12/17/2012   Cough 12/17/2012   Reactive airway disease with wheezing 12/03/2012   URI, acute 12/03/2012   Hypothyroidism 10/29/2012   Unspecified vitamin D deficiency 10/29/2012   Hyperlipidemia LDL goal <130 10/29/2012   GERD (gastroesophageal reflux disease) 10/29/2012   Osteoporosis 10/29/2012   Myalgia and myositis 10/29/2012    Sumner Boast., PT 11/14/2020, 11:52 AM  [place]. [place], [place], [address] Phone: [phone]   Fax:  [phone]  Name: Linsi Humann MRN: [id number] Date of Birth: Oct 31, 1945

## 2020-11-16 ENCOUNTER — Encounter: Payer: Self-pay | Admitting: Physical Therapy

## 2020-11-16 ENCOUNTER — Ambulatory Visit: Payer: Medicare Other | Admitting: Physical Therapy

## 2020-11-16 ENCOUNTER — Other Ambulatory Visit: Payer: Self-pay

## 2020-11-16 DIAGNOSIS — M25661 Stiffness of right knee, not elsewhere classified: Secondary | ICD-10-CM

## 2020-11-16 DIAGNOSIS — M25562 Pain in left knee: Secondary | ICD-10-CM | POA: Diagnosis not present

## 2020-11-16 DIAGNOSIS — R262 Difficulty in walking, not elsewhere classified: Secondary | ICD-10-CM

## 2020-11-16 NOTE — Therapy (Signed)
[place]. [place], [place], [address] Phone: [phone]   Fax:  [phone]  Physical Therapy Treatment  Patient Details  Name: [name] [name] MRN: [id number] Date of Birth: 1946/03/18 Referring Provider (PT): Fenton Foy, [place]   Encounter Date: 11/16/2020   PT End of Session - 11/16/20 0945     Visit Number 12    Date for PT Re-Evaluation 12/10/20    Authorization Type U[employer] Medicare    PT Start Time 0914    PT Stop Time 1000    PT Time Calculation (min) 46 min    Activity Tolerance Patient tolerated treatment well    Behavior During Therapy [facility] for tasks assessed/performed             Past Medical History:  Diagnosis Date   Acute bronchiolitis due to other infectious organisms    Acute bronchiolitis due to respiratory syncytial virus (RSV)    Arthritis    OA AND PAIN RIGHT KNEE   Degeneration of intervertebral disc, site unspecified    Disorder of bone and cartilage, unspecified    Disturbance of skin sensation    Encounter for long-term (current) use of other medications    GERD (gastroesophageal reflux disease)    PT STATES SEVERE REFLUX IF SHE DOES NOT TAKE HER OMEPRAZOLE   Headache(784.0)    WHEN REFLUX SEVERE   Hypothyroidism    Long term (current) use of anticoagulants    Osteoarthrosis, unspecified whether generalized or localized, lower leg    Osteoporosis, unspecified    Other and unspecified hyperlipidemia    Other atopic dermatitis and related conditions    Other malaise and fatigue    Other specified pre-operative examination 11/07/2011   Thoracic or lumbosacral neuritis or radiculitis, unspecified    Unspecified vitamin D deficiency    Vegetarian diet     Past Surgical History:  Procedure Laterality Date   CERVICAL BIOPSY  W/ LOOP ELECTRODE EXCISION     EYE SURGERY  2016   Left eye, Cataract Removal   TEETH EXTRACTIONS--NO OTHER SURGERY     TOTAL KNEE ARTHROPLASTY   11/28/2011   Procedure: TOTAL KNEE ARTHROPLASTY;  Surgeon: Tobi Bastos, MD;  Location: [facility];  Service: Orthopedics;  Laterality: Right;   TUBAL LIGATION      There were no vitals filed for this visit.   Subjective Assessment - 11/16/20 0917     Subjective No changes after the SI MET    Currently in Pain? No/denies    Aggravating Factors  mostly when lying on back at night                               [facility] Adult PT Treatment/Exercise - 11/16/20 0001       Lumbar Exercises: Supine   Other Supine Lumbar Exercises feet on ball K2C, trunk rotation, small bridge and isometric abs      Knee/Hip Exercises: Stretches   ITB Stretch Both;3 reps;20 seconds      Knee/Hip Exercises: Aerobic   Nustep level 5 x 6 minutes LE only      Knee/Hip Exercises: Machines for Strengthening   Cybex Knee Extension 5# 2x10    Cybex Knee Flexion 10# 2x10    Other Machine 5# straight arm pulls for core activation      Knee/Hip Exercises: Sidelying   Clams 5# 2x10 each      Traction  Type of Traction Lumbar    Max (lbs) 50    Hold Time static    Time 12                      PT Short Term Goals - 09/28/20 1035       PT SHORT TERM GOAL #1   Title independent with initial HEP    Status Achieved               PT Long Term Goals - 11/16/20 0946       PT LONG TERM GOAL #1   Title decrease pain 50%    Status Partially Met      PT LONG TERM GOAL #2   Title increase right knee AROM to 110 degrees flexion    Status Achieved      PT LONG TERM GOAL #3   Title go up and down stairs step over step    Status Partially Met                   Plan - 11/16/20 0945     Clinical Impression Statement Patient comes in after the last visit where we added some SI MET to see if we could get some change and she reported no changes.  I added lumbar traction to see if there is any back component to this only at night tingling and pain in the leg, they  report that they have had MRI of the legs, I do not see any imaging for the back    PT Next Visit Plan assess use of traction    Consulted and Agree with Plan of Care Patient;Family member/caregiver             Patient will benefit from skilled therapeutic intervention in order to improve the following deficits and impairments:  Abnormal gait, Decreased mobility, Decreased range of motion, Decreased strength, Difficulty walking, Impaired flexibility, Pain, Decreased balance  Visit Diagnosis: Acute pain of left knee  Difficulty in walking, not elsewhere classified  Stiffness of right knee, not elsewhere classified     Problem List Patient Active Problem List   Diagnosis Date Noted   History of total knee replacement, right 12/26/2018   Osteoarthritis 12/26/2018   Dermatochalasis of both upper eyelids 02/14/2018   Posterior capsular opacification non visually significant of left eye 02/14/2018   Pseudophakia of left eye 02/14/2018   Estrogen deficiency 12/14/2016   Anemia 12/14/2016   Cortical age-related cataract of right eye 11/20/2016   Macular hole of left eye 11/20/2016   Nuclear sclerotic cataract of right eye 11/20/2016   Presbyopia of both eyes 11/20/2016   PVD (posterior vitreous detachment), right 11/20/2016   Vaginal dysplasia 09/20/2016   Muscle spasm of left lower extremity 05/11/2016   Swelling of joint of left knee 05/11/2016   Dysplasia of cervix, low grade (CIN 1) 05/11/2016   High risk medication use 05/11/2016   Thyroid activity decreased 11/19/2014   Pain, joint, multiple sites 11/19/2014   Numbness and tingling of left leg 04/06/2014   Routine general medical examination at a health care facility 09/29/2013   Osteoporosis, post-menopausal 04/21/2013   Cough variant asthma 03/17/2013   Psoriasis 03/17/2013   Persistent dry cough 03/17/2013   Osteoarthritis of left knee 03/03/2013   Psoriasis-like skin disease 03/03/2013   Eczema 01/07/2013   Need  for prophylactic vaccination and inoculation against influenza 01/07/2013   Prediabetes 12/17/2012   Cough 12/17/2012   Reactive  airway disease with wheezing 12/03/2012   URI, acute 12/03/2012   Hypothyroidism 10/29/2012   Unspecified vitamin D deficiency 10/29/2012   Hyperlipidemia LDL goal <130 10/29/2012   GERD (gastroesophageal reflux disease) 10/29/2012   Osteoporosis 10/29/2012   Myalgia and myositis 10/29/2012    Sumner Boast., PT 11/16/2020, 9:47 AM  [place]. [place], [place], [address] Phone: [phone]   Fax:  [phone]  Name: [name] [name] MRN: [id number] Date of Birth: September 03, 1945

## 2020-11-21 ENCOUNTER — Other Ambulatory Visit: Payer: Self-pay

## 2020-11-21 ENCOUNTER — Ambulatory Visit: Payer: Medicare Other | Admitting: Rehabilitative and Restorative Service Providers"

## 2020-11-21 ENCOUNTER — Encounter: Payer: Self-pay | Admitting: Rehabilitative and Restorative Service Providers"

## 2020-11-21 DIAGNOSIS — M25562 Pain in left knee: Secondary | ICD-10-CM

## 2020-11-21 DIAGNOSIS — M25661 Stiffness of right knee, not elsewhere classified: Secondary | ICD-10-CM

## 2020-11-21 DIAGNOSIS — G8929 Other chronic pain: Secondary | ICD-10-CM

## 2020-11-21 DIAGNOSIS — R262 Difficulty in walking, not elsewhere classified: Secondary | ICD-10-CM

## 2020-11-21 NOTE — Therapy (Signed)
[place]. [place], [place], [address] Phone: [phone]   Fax:  [phone]  Physical Therapy Treatment  Patient Details  Name: [name] [name] MRN: [id number] Date of Birth: February 22, 1946 Referring Provider (PT): Fenton Foy, [place]   Encounter Date: 11/21/2020   PT End of Session - 11/21/20 0946     Visit Number 13    Date for PT Re-Evaluation 12/10/20    Authorization Type UHC Medicare    PT Start Time 0925    PT Stop Time 1005    PT Time Calculation (min) 40 min    Activity Tolerance Patient tolerated treatment well    Behavior During Therapy WFL for tasks assessed/performed             Past Medical History:  Diagnosis Date   Acute bronchiolitis due to other infectious organisms    Acute bronchiolitis due to respiratory syncytial virus (RSV)    Arthritis    OA AND PAIN RIGHT KNEE   Degeneration of intervertebral disc, site unspecified    Disorder of bone and cartilage, unspecified    Disturbance of skin sensation    Encounter for long-term (current) use of other medications    GERD (gastroesophageal reflux disease)    PT STATES SEVERE REFLUX IF SHE DOES NOT TAKE HER OMEPRAZOLE   Headache(784.0)    WHEN REFLUX SEVERE   Hypothyroidism    Long term (current) use of anticoagulants    Osteoarthrosis, unspecified whether generalized or localized, lower leg    Osteoporosis, unspecified    Other and unspecified hyperlipidemia    Other atopic dermatitis and related conditions    Other malaise and fatigue    Other specified pre-operative examination 11/07/2011   Thoracic or lumbosacral neuritis or radiculitis, unspecified    Unspecified vitamin D deficiency    Vegetarian diet     Past Surgical History:  Procedure Laterality Date   CERVICAL BIOPSY  W/ LOOP ELECTRODE EXCISION     EYE SURGERY  2016   Left eye, Cataract Removal   TEETH EXTRACTIONS--NO OTHER SURGERY     TOTAL KNEE ARTHROPLASTY   11/28/2011   Procedure: TOTAL KNEE ARTHROPLASTY;  Surgeon: Tobi Bastos, MD;  Location: [facility];  Service: Orthopedics;  Laterality: Right;   TUBAL LIGATION      There were no vitals filed for this visit.   Subjective Assessment - 11/21/20 0945     Subjective Pt reports that her pain is better, but she still has tingling in her LLE.    Currently in Pain? Yes    Pain Score 3     Pain Location Knee    Pain Orientation Left    Pain Descriptors / Indicators Tingling                               [facility] Adult PT Treatment/Exercise - 11/21/20 0001       Knee/Hip Exercises: Aerobic   Nustep level 5 x 6 minutes LE only      Knee/Hip Exercises: Seated   Long Arc Quad Both;2 sets;10 reps    Long Arc Quad Weight 5 lbs.    Marching Strengthening;Both;2 sets;10 reps    Marching Weights 5 lbs.    Hamstring Curl Strengthening;Both;2 sets;10 reps    Hamstring Limitations green tband    Abduction/Adduction  Strengthening;Both;2 sets;10 reps    Abd/Adduction Limitations green tband      Manual  Therapy   Manual Therapy Soft tissue mobilization    Soft tissue mobilization Left ITB, lateral HS      Kinesiotix   Create Space Kinesiotape applied to L IT band and L quad with Y strip around patella to increase space and decrease pain                      PT Short Term Goals - 09/28/20 1035       PT SHORT TERM GOAL #1   Title independent with initial HEP    Status Achieved               PT Long Term Goals - 11/16/20 0946       PT LONG TERM GOAL #1   Title decrease pain 50%    Status Partially Met      PT LONG TERM GOAL #2   Title increase right knee AROM to 110 degrees flexion    Status Achieved      PT LONG TERM GOAL #3   Title go up and down stairs step over step    Status Partially Met                   Plan - 11/21/20 1009     Clinical Impression Statement Patient reports that the traction did no help her las session.   She reported the most relief in her pain/tingling with the use of STM and manual trigger point release to her Left lateral thigh region.  She had some pain with Nustep today, but once cued to slow down and maintain steps per min of no more than 70, she said that the pain ceased.  She contiues to progress towards goal related activities and requires skilled PT.    PT Treatment/Interventions ADLs/Self Care Home Management;Cryotherapy;Electrical Stimulation;Iontophoresis 74m/ml Dexamethasone;Moist Heat;Ultrasound;Gait training;Neuromuscular re-education;Balance training;Therapeutic exercise;Therapeutic activities;Functional mobility training;Stair training;Patient/family education;Manual techniques;Dry needling    PT Next Visit Plan progress strengthening, balance    Consulted and Agree with Plan of Care Patient;Family member/caregiver             Patient will benefit from skilled therapeutic intervention in order to improve the following deficits and impairments:  Abnormal gait, Decreased mobility, Decreased range of motion, Decreased strength, Difficulty walking, Impaired flexibility, Pain, Decreased balance  Visit Diagnosis: Acute pain of left knee  Difficulty in walking, not elsewhere classified  Stiffness of right knee, not elsewhere classified  Chronic pain of right knee     Problem List Patient Active Problem List   Diagnosis Date Noted   History of total knee replacement, right 12/26/2018   Osteoarthritis 12/26/2018   Dermatochalasis of both upper eyelids 02/14/2018   Posterior capsular opacification non visually significant of left eye 02/14/2018   Pseudophakia of left eye 02/14/2018   Estrogen deficiency 12/14/2016   Anemia 12/14/2016   Cortical age-related cataract of right eye 11/20/2016   Macular hole of left eye 11/20/2016   Nuclear sclerotic cataract of right eye 11/20/2016   Presbyopia of both eyes 11/20/2016   PVD (posterior vitreous detachment), right 11/20/2016    Vaginal dysplasia 09/20/2016   Muscle spasm of left lower extremity 05/11/2016   Swelling of joint of left knee 05/11/2016   Dysplasia of cervix, low grade (CIN 1) 05/11/2016   High risk medication use 05/11/2016   Thyroid activity decreased 11/19/2014   Pain, joint, multiple sites 11/19/2014   Numbness and tingling of left leg 04/06/2014   Routine general medical examination  at a health care facility 09/29/2013   Osteoporosis, post-menopausal 04/21/2013   Cough variant asthma 03/17/2013   Psoriasis 03/17/2013   Persistent dry cough 03/17/2013   Osteoarthritis of left knee 03/03/2013   Psoriasis-like skin disease 03/03/2013   Eczema 01/07/2013   Need for prophylactic vaccination and inoculation against influenza 01/07/2013   Prediabetes 12/17/2012   Cough 12/17/2012   Reactive airway disease with wheezing 12/03/2012   URI, acute 12/03/2012   Hypothyroidism 10/29/2012   Unspecified vitamin D deficiency 10/29/2012   Hyperlipidemia LDL goal <130 10/29/2012   GERD (gastroesophageal reflux disease) 10/29/2012   Osteoporosis 10/29/2012   Myalgia and myositis 10/29/2012    Juel Burrow, PT, DPT 11/21/2020, 10:13 AM  [place]. [place], [place], [address] Phone: [phone]   Fax:  [phone]  Name: [name] [name] MRN: [id number] Date of Birth: 1945-10-27

## 2020-11-23 ENCOUNTER — Other Ambulatory Visit: Payer: Self-pay

## 2020-11-23 ENCOUNTER — Ambulatory Visit: Payer: Medicare Other | Admitting: Rehabilitative and Restorative Service Providers"

## 2020-11-23 DIAGNOSIS — M25562 Pain in left knee: Secondary | ICD-10-CM | POA: Diagnosis not present

## 2020-11-23 DIAGNOSIS — G8929 Other chronic pain: Secondary | ICD-10-CM

## 2020-11-23 DIAGNOSIS — M25661 Stiffness of right knee, not elsewhere classified: Secondary | ICD-10-CM

## 2020-11-23 DIAGNOSIS — R262 Difficulty in walking, not elsewhere classified: Secondary | ICD-10-CM

## 2020-11-23 NOTE — Therapy (Signed)
[address]. [place], [place], [address] Phone: [phone]   Fax:  [phone]  Physical Therapy Treatment  Patient Details  Name: [name] [name] MRN: [id number] Date of Birth: [birth year] Referring Provider (PT): Fenton Foy, [place]   Encounter Date: 11/23/2020   PT End of Session - 11/23/20 0932     Visit Number 14    Date for PT Re-Evaluation 12/10/20    Authorization Type UHC Medicare    PT Start Time 0930    PT Stop Time 1010    PT Time Calculation (min) 40 min    Activity Tolerance Patient tolerated treatment well    Behavior During Therapy WFL for tasks assessed/performed             Past Medical History:  Diagnosis Date   Acute bronchiolitis due to other infectious organisms    Acute bronchiolitis due to respiratory syncytial virus (RSV)    Arthritis    OA AND PAIN RIGHT KNEE   Degeneration of intervertebral disc, site unspecified    Disorder of bone and cartilage, unspecified    Disturbance of skin sensation    Encounter for long-term (current) use of other medications    GERD (gastroesophageal reflux disease)    PT STATES SEVERE REFLUX IF SHE DOES NOT TAKE HER OMEPRAZOLE   Headache(784.0)    WHEN REFLUX SEVERE   Hypothyroidism    Long term (current) use of anticoagulants    Osteoarthrosis, unspecified whether generalized or localized, lower leg    Osteoporosis, unspecified    Other and unspecified hyperlipidemia    Other atopic dermatitis and related conditions    Other malaise and fatigue    Other specified pre-operative examination 11/07/2011   Thoracic or lumbosacral neuritis or radiculitis, unspecified    Unspecified vitamin D deficiency    Vegetarian diet     Past Surgical History:  Procedure Laterality Date   CERVICAL BIOPSY  W/ LOOP ELECTRODE EXCISION     EYE SURGERY  2016   Left eye, Cataract Removal   TEETH EXTRACTIONS--NO OTHER SURGERY     TOTAL KNEE ARTHROPLASTY   11/28/2011   Procedure: TOTAL KNEE ARTHROPLASTY;  Surgeon: Tobi Bastos, MD;  Location: [facility];  Service: Orthopedics;  Laterality: Right;   TUBAL LIGATION      There were no vitals filed for this visit.   Subjective Assessment - 11/23/20 0932     Subjective I am still having the tingling in my leg.    Patient is accompained by: Family member;Interpreter    Pertinent History right TKA    Currently in Pain? Yes    Pain Score 4     Pain Location Knee    Pain Orientation Left    Pain Descriptors / Indicators Tingling                               [facility] Adult PT Treatment/Exercise - 11/23/20 0001       Knee/Hip Exercises: Stretches   Passive Hamstring Stretch Both;3 reps;20 seconds    Quad Stretch Both;2 reps;30 seconds    Hip Flexor Stretch Left;2 reps;20 seconds    ITB Stretch Left;2 reps;20 seconds      Knee/Hip Exercises: Aerobic   Nustep level 5 x 6 minutes LE only      Knee/Hip Exercises: Machines for Strengthening   Other Machine 10# straight arm pulls for core activation  Knee/Hip Exercises: Standing   Walking with Sports Cord 30#, 4 way      Knee/Hip Exercises: Prone   Hip Extension Both;2 sets;10 reps      Manual Therapy   Manual Therapy Soft tissue mobilization    Manual therapy comments Left hip flex,quad and IT                    PT Education - 11/23/20 1013     Education Details Pt and husband educated about the benefits of joining [employer] or other gym for senior exercise classes.    Person(s) Educated Patient;Spouse    Methods Explanation    Comprehension Verbalized understanding              PT Short Term Goals - 09/28/20 1035       PT SHORT TERM GOAL #1   Title independent with initial HEP    Status Achieved               PT Long Term Goals - 11/23/20 1017       PT LONG TERM GOAL #1   Title decrease pain 50%    Status Partially Met      PT LONG TERM GOAL #3   Title go up and down stairs  step over step    Status Partially Met      PT LONG TERM GOAL #4   Title report sleep 50% better    Status Partially Met                   Plan - 11/23/20 1014     Clinical Impression Statement Pt reported improvement in pain following stretching exercises.  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  She was able to increase weights with resisted walking and with shoulder extension, but did require CGA with resisted walking to prevent loss of balance.  She would benefit from additional PT to progress twoards goal related activities as she prepares to transition to [employer] and community gym.    PT Treatment/Interventions ADLs/Self Care Home Management;Cryotherapy;Electrical Stimulation;Iontophoresis 93m/ml Dexamethasone;Moist Heat;Ultrasound;Gait training;Neuromuscular re-education;Balance training;Therapeutic exercise;Therapeutic activities;Functional mobility training;Stair training;Patient/family education;Manual techniques;Dry needling    PT Next Visit Plan progress strengthening, balance    Consulted and Agree with Plan of Care Patient;Family member/caregiver             Patient will benefit from skilled therapeutic intervention in order to improve the following deficits and impairments:  Abnormal gait, Decreased mobility, Decreased range of motion, Decreased strength, Difficulty walking, Impaired flexibility, Pain, Decreased balance  Visit Diagnosis: Acute pain of left knee  Difficulty in walking, not elsewhere classified  Stiffness of right knee, not elsewhere classified  Chronic pain of right knee     Problem List Patient Active Problem List   Diagnosis Date Noted   History of total knee replacement, right 12/26/2018   Osteoarthritis 12/26/2018   Dermatochalasis of both upper eyelids 02/14/2018   Posterior capsular opacification non visually  significant of left eye 02/14/2018   Pseudophakia of left eye 02/14/2018   Estrogen deficiency 12/14/2016   Anemia 12/14/2016   Cortical age-related cataract of right eye 11/20/2016   Macular hole of left eye 11/20/2016   Nuclear sclerotic cataract of right eye 11/20/2016   Presbyopia of both eyes 11/20/2016   PVD (posterior vitreous detachment), right 11/20/2016   Vaginal dysplasia 09/20/2016  Muscle spasm of left lower extremity 05/11/2016   Swelling of joint of left knee 05/11/2016   Dysplasia of cervix, low grade (CIN 1) 05/11/2016   High risk medication use 05/11/2016   Thyroid activity decreased 11/19/2014   Pain, joint, multiple sites 11/19/2014   Numbness and tingling of left leg 04/06/2014   Routine general medical examination at a health care facility 09/29/2013   Osteoporosis, post-menopausal 04/21/2013   Cough variant asthma 03/17/2013   Psoriasis 03/17/2013   Persistent dry cough 03/17/2013   Osteoarthritis of left knee 03/03/2013   Psoriasis-like skin disease 03/03/2013   Eczema 01/07/2013   Need for prophylactic vaccination and inoculation against influenza 01/07/2013   Prediabetes 12/17/2012   Cough 12/17/2012   Reactive airway disease with wheezing 12/03/2012   URI, acute 12/03/2012   Hypothyroidism 10/29/2012   Unspecified vitamin D deficiency 10/29/2012   Hyperlipidemia LDL goal <130 10/29/2012   GERD (gastroesophageal reflux disease) 10/29/2012   Osteoporosis 10/29/2012   Myalgia and myositis 10/29/2012    Juel Burrow, PT, DPT 11/23/2020, 10:19 AM  [address]. [place], [place], [address] Phone: [phone]   Fax:  [phone]  Name: [name] [name] MRN: [id number] Date of Birth: [birth year]

## 2020-11-28 ENCOUNTER — Other Ambulatory Visit: Payer: Self-pay

## 2020-11-28 ENCOUNTER — Encounter: Payer: Self-pay | Admitting: Rehabilitative and Restorative Service Providers"

## 2020-11-28 ENCOUNTER — Ambulatory Visit: Payer: Medicare Other | Admitting: Rehabilitative and Restorative Service Providers"

## 2020-11-28 DIAGNOSIS — M25562 Pain in left knee: Secondary | ICD-10-CM

## 2020-11-28 DIAGNOSIS — G8929 Other chronic pain: Secondary | ICD-10-CM

## 2020-11-28 DIAGNOSIS — R262 Difficulty in walking, not elsewhere classified: Secondary | ICD-10-CM

## 2020-11-28 DIAGNOSIS — M25661 Stiffness of right knee, not elsewhere classified: Secondary | ICD-10-CM

## 2020-11-28 NOTE — Therapy (Signed)
[place]. [place], [place], [address] Phone: [phone]   Fax:  [phone]  Physical Therapy Treatment  Patient Details  Name: [name] [name] MRN: [id number] Date of Birth: Jan 21, 1946 Referring Provider (PT): Fenton Foy, [place]   Encounter Date: 11/28/2020   PT End of Session - 11/28/20 0940     Visit Number 15    Date for PT Re-Evaluation 12/10/20    Authorization Type UHC Medicare    PT Start Time 0930    PT Stop Time 1010    PT Time Calculation (min) 40 min    Activity Tolerance Patient tolerated treatment well    Behavior During Therapy WFL for tasks assessed/performed             Past Medical History:  Diagnosis Date   Acute bronchiolitis due to other infectious organisms    Acute bronchiolitis due to respiratory syncytial virus (RSV)    Arthritis    OA AND PAIN RIGHT KNEE   Degeneration of intervertebral disc, site unspecified    Disorder of bone and cartilage, unspecified    Disturbance of skin sensation    Encounter for long-term (current) use of other medications    GERD (gastroesophageal reflux disease)    PT STATES SEVERE REFLUX IF SHE DOES NOT TAKE HER OMEPRAZOLE   Headache(784.0)    WHEN REFLUX SEVERE   Hypothyroidism    Long term (current) use of anticoagulants    Osteoarthrosis, unspecified whether generalized or localized, lower leg    Osteoporosis, unspecified    Other and unspecified hyperlipidemia    Other atopic dermatitis and related conditions    Other malaise and fatigue    Other specified pre-operative examination 11/07/2011   Thoracic or lumbosacral neuritis or radiculitis, unspecified    Unspecified vitamin D deficiency    Vegetarian diet     Past Surgical History:  Procedure Laterality Date   CERVICAL BIOPSY  W/ LOOP ELECTRODE EXCISION     EYE SURGERY  2016   Left eye, Cataract Removal   TEETH EXTRACTIONS--NO OTHER SURGERY     TOTAL KNEE ARTHROPLASTY   11/28/2011   Procedure: TOTAL KNEE ARTHROPLASTY;  Surgeon: Tobi Bastos, MD;  Location: [facility];  Service: Orthopedics;  Laterality: Right;   TUBAL LIGATION      There were no vitals filed for this visit.   Subjective Assessment - 11/28/20 0939     Subjective Pt reports still having pain in her L lateral thigh, reports manual therapy helps most.    Currently in Pain? Yes    Pain Score 4     Pain Location Knee    Pain Orientation Left;Lateral                               [facility] Adult PT Treatment/Exercise - 11/28/20 0001       High Level Balance   High Level Balance Activities Side stepping;Tandem walking   on AirEx beam     Knee/Hip Exercises: Aerobic   Nustep level 5 x 6 minutes LE only      Knee/Hip Exercises: Standing   Knee Flexion Strengthening;Both;2 sets;10 reps   HS curls   Knee Flexion Limitations 3#    Hip Flexion Stengthening;Both;2 sets;10 reps    Hip Flexion Limitations 3#    Hip Abduction Stengthening;Both;2 sets;10 reps    Abduction Limitations 3#    Hip Extension Stengthening;Both;2 sets;10 reps  Extension Limitations 3#      Manual Therapy   Manual Therapy Soft tissue mobilization;Myofascial release    Manual therapy comments Left hip flex,quad and IT    Soft tissue mobilization Left ITB, lateral HS                      PT Short Term Goals - 09/28/20 1035       PT SHORT TERM GOAL #1   Title independent with initial HEP    Status Achieved               PT Long Term Goals - 11/23/20 1017       PT LONG TERM GOAL #1   Title decrease pain 50%    Status Partially Met      PT LONG TERM GOAL #3   Title go up and down stairs step over step    Status Partially Met      PT LONG TERM GOAL #4   Title report sleep 50% better    Status Partially Met                   Plan - 11/28/20 1019     Clinical Impression Statement [name] continues to have biggest relief of pain in her thigh with manual  therapy.  She continues to have trigger points noted that are relieved with manual trigger point release.  She is progressing with LE strengthening and balance.  She did not have any loss of balance noted today and only required occasional CGA on balance beam.  She continues to require skilled PT to progress towards goal related activities.    PT Treatment/Interventions ADLs/Self Care Home Management;Cryotherapy;Electrical Stimulation;Iontophoresis 20m/ml Dexamethasone;Moist Heat;Ultrasound;Gait training;Neuromuscular re-education;Balance training;Therapeutic exercise;Therapeutic activities;Functional mobility training;Stair training;Patient/family education;Manual techniques;Dry needling    PT Next Visit Plan progress strengthening, balance    Consulted and Agree with Plan of Care Patient;Family member/caregiver             Patient will benefit from skilled therapeutic intervention in order to improve the following deficits and impairments:  Abnormal gait, Decreased mobility, Decreased range of motion, Decreased strength, Difficulty walking, Impaired flexibility, Pain, Decreased balance  Visit Diagnosis: Acute pain of left knee  Difficulty in walking, not elsewhere classified  Stiffness of right knee, not elsewhere classified  Chronic pain of right knee     Problem List Patient Active Problem List   Diagnosis Date Noted   History of total knee replacement, right 12/26/2018   Osteoarthritis 12/26/2018   Dermatochalasis of both upper eyelids 02/14/2018   Posterior capsular opacification non visually significant of left eye 02/14/2018   Pseudophakia of left eye 02/14/2018   Estrogen deficiency 12/14/2016   Anemia 12/14/2016   Cortical age-related cataract of right eye 11/20/2016   Macular hole of left eye 11/20/2016   Nuclear sclerotic cataract of right eye 11/20/2016   Presbyopia of both eyes 11/20/2016   PVD (posterior vitreous detachment), right 11/20/2016   Vaginal dysplasia  09/20/2016   Muscle spasm of left lower extremity 05/11/2016   Swelling of joint of left knee 05/11/2016   Dysplasia of cervix, low grade (CIN 1) 05/11/2016   High risk medication use 05/11/2016   Thyroid activity decreased 11/19/2014   Pain, joint, multiple sites 11/19/2014   Numbness and tingling of left leg 04/06/2014   Routine general medical examination at a health care facility 09/29/2013   Osteoporosis, post-menopausal 04/21/2013   Cough variant asthma 03/17/2013  Psoriasis 03/17/2013   Persistent dry cough 03/17/2013   Osteoarthritis of left knee 03/03/2013   Psoriasis-like skin disease 03/03/2013   Eczema 01/07/2013   Need for prophylactic vaccination and inoculation against influenza 01/07/2013   Prediabetes 12/17/2012   Cough 12/17/2012   Reactive airway disease with wheezing 12/03/2012   URI, acute 12/03/2012   Hypothyroidism 10/29/2012   Unspecified vitamin D deficiency 10/29/2012   Hyperlipidemia LDL goal <130 10/29/2012   GERD (gastroesophageal reflux disease) 10/29/2012   Osteoporosis 10/29/2012   Myalgia and myositis 10/29/2012    Juel Burrow, PT, DPT 11/28/2020, 10:23 AM  [place]. [place], [place], [address] Phone: [phone]   Fax:  [phone]  Name: Araminta Zorn MRN: [id number] Date of Birth: 11/07/1945

## 2020-11-30 ENCOUNTER — Other Ambulatory Visit: Payer: Self-pay

## 2020-11-30 ENCOUNTER — Encounter: Payer: Self-pay | Admitting: Rehabilitative and Restorative Service Providers"

## 2020-11-30 ENCOUNTER — Ambulatory Visit: Payer: Medicare Other | Admitting: Rehabilitative and Restorative Service Providers"

## 2020-11-30 DIAGNOSIS — M25661 Stiffness of right knee, not elsewhere classified: Secondary | ICD-10-CM

## 2020-11-30 DIAGNOSIS — M25562 Pain in left knee: Secondary | ICD-10-CM | POA: Diagnosis not present

## 2020-11-30 DIAGNOSIS — R262 Difficulty in walking, not elsewhere classified: Secondary | ICD-10-CM

## 2020-11-30 NOTE — Patient Instructions (Signed)
Access Code: [email or web address] URL: [email or web address] Date: 11/30/2020 Prepared by: Clydie Braun [place] Natarajan  Exercises Supine Hamstring Stretch - 2 x daily - 7 x weekly - 1 sets - 3 reps - 20 sec hold Supine Figure 4 Piriformis Stretch - 2 x daily - 7 x weekly - 1 sets - 3 reps - 20 sec hold Supine Bridge with Resistance Band - 2 x daily - 7 x weekly - 2 sets - 10 reps Clamshell with Resistance - 2 x daily - 7 x weekly - 2 sets - 10 reps Quadriceps Stretch with Chair - 2 x daily - 7 x weekly - 1 sets - 3 reps - 20 sec hold

## 2020-11-30 NOTE — Therapy (Signed)
[place]. [place], [place], [address] Phone: [phone]   Fax:  [phone]  Physical Therapy Treatment  Patient Details  Name: Linnell Swords MRN: [id number] Date of Birth: 01-Oct-1945 Referring Provider (PT): Fenton Foy, [place]   Encounter Date: 11/30/2020   PT End of Session - 11/30/20 0920     Visit Number 16    Date for PT Re-Evaluation 12/10/20    Authorization Type UHC Medicare    PT Start Time 0918    PT Stop Time 1011    PT Time Calculation (min) 53 min    Activity Tolerance Patient tolerated treatment well    Behavior During Therapy WFL for tasks assessed/performed             Past Medical History:  Diagnosis Date   Acute bronchiolitis due to other infectious organisms    Acute bronchiolitis due to respiratory syncytial virus (RSV)    Arthritis    OA AND PAIN RIGHT KNEE   Degeneration of intervertebral disc, site unspecified    Disorder of bone and cartilage, unspecified    Disturbance of skin sensation    Encounter for long-term (current) use of other medications    GERD (gastroesophageal reflux disease)    PT STATES SEVERE REFLUX IF SHE DOES NOT TAKE HER OMEPRAZOLE   Headache(784.0)    WHEN REFLUX SEVERE   Hypothyroidism    Long term (current) use of anticoagulants    Osteoarthrosis, unspecified whether generalized or localized, lower leg    Osteoporosis, unspecified    Other and unspecified hyperlipidemia    Other atopic dermatitis and related conditions    Other malaise and fatigue    Other specified pre-operative examination 11/07/2011   Thoracic or lumbosacral neuritis or radiculitis, unspecified    Unspecified vitamin D deficiency    Vegetarian diet     Past Surgical History:  Procedure Laterality Date   CERVICAL BIOPSY  W/ LOOP ELECTRODE EXCISION     EYE SURGERY  2016   Left eye, Cataract Removal   TEETH EXTRACTIONS--NO OTHER SURGERY     TOTAL KNEE ARTHROPLASTY   11/28/2011   Procedure: TOTAL KNEE ARTHROPLASTY;  Surgeon: Tobi Bastos, MD;  Location: [facility];  Service: Orthopedics;  Laterality: Right;   TUBAL LIGATION      There were no vitals filed for this visit.   Subjective Assessment - 11/30/20 0919     Subjective Pt continues to report L lateral thigh pain and manual therapy helping most.    Patient is accompained by: Family member;Interpreter   husband present entire visit.  Interpreter arrived for last 10 minutes.   Patient Stated Goals have no pain, do stairs normally    Currently in Pain? Yes    Pain Score 4     Pain Location Knee    Pain Orientation Left                               [facility] Adult PT Treatment/Exercise - 11/30/20 0001       Ambulation/Gait   Stairs Yes    Stairs Assistance 7: Independent    Stair Management Technique One rail Right;Alternating pattern    Number of Stairs 12      Lumbar Exercises: Supine   Bridge 10 reps;1 second    Bridge Limitations on orange theraball 2x10 reps    Other Supine Lumbar Exercises feet on ball [facility]  Knee/Hip Exercises: Stretches   Sports administrator Left;3 reps;20 seconds    Quad Stretch Limitations in standing to also incorporate hip flexor stretch      Knee/Hip Exercises: Aerobic   Nustep level 5 x 6 minutes LE only      Knee/Hip Exercises: Sidelying   Hip ABduction Strengthening;Left;2 sets;10 reps    Hip ABduction Limitations 5#    Clams LLE 5# 2x10 each      Knee/Hip Exercises: Prone   Hip Extension Both;2 sets;10 reps      Manual Therapy   Manual Therapy Soft tissue mobilization;Myofascial release    Manual therapy comments Left hip flex,quad and IT    Soft tissue mobilization Left ITB, lateral HS                    PT Education - 11/30/20 1009     Education Details Provided pt with HEP    Person(s) Educated Patient;Spouse    Methods Explanation;Demonstration;Handout    Comprehension Verbalized understanding;Returned  demonstration              PT Short Term Goals - 09/28/20 1035       PT SHORT TERM GOAL #1   Title independent with initial HEP    Status Achieved               PT Long Term Goals - 11/30/20 0931       PT LONG TERM GOAL #1   Status Partially Met      PT LONG TERM GOAL #3   Title go up and down stairs step over step    Status Achieved      PT LONG TERM GOAL #4   Title report sleep 50% better    Status Achieved                   Plan - 11/30/20 1013     Clinical Impression Statement Ms Boehlke continues to report the biggest relief of pain in her L thigh/quad region with manual therapy and manual trigger point release.  She and spouse were educated on looking into alternatives after discharge next week, including options of [facility] at [facility] or [facility].  Pt provided with HEP to assist with her progress following DC.  Pt has met all goals except pain goal and she is nearing meeting that goal.  She would benefit from one additional week of skilled PT to progress HEP, as needed and to transition pt to community program.    PT Treatment/Interventions ADLs/Self Care Home Management;Cryotherapy;Electrical Stimulation;Iontophoresis 13m/ml Dexamethasone;Moist Heat;Ultrasound;Gait training;Neuromuscular re-education;Balance training;Therapeutic exercise;Therapeutic activities;Functional mobility training;Stair training;Patient/family education;Manual techniques;Dry needling    PT Next Visit Plan progress hip flexor/quad stretch, strengthening of lateral quad, core stability    PT Home Exercise Plan Access Code: [email or web address]   Consulted and Agree with Plan of Care Patient;Family member/caregiver             Patient will benefit from skilled therapeutic intervention in order to improve the following deficits and impairments:  Abnormal gait, Decreased mobility, Decreased range of motion, Decreased strength, Difficulty walking, Impaired flexibility, Pain, Decreased  balance  Visit Diagnosis: Acute pain of left knee  Difficulty in walking, not elsewhere classified  Stiffness of right knee, not elsewhere classified     Problem List Patient Active Problem List   Diagnosis Date Noted   History of total knee replacement, right 12/26/2018   Osteoarthritis 12/26/2018   Dermatochalasis of both upper eyelids 02/14/2018  Posterior capsular opacification non visually significant of left eye 02/14/2018   Pseudophakia of left eye 02/14/2018   Estrogen deficiency 12/14/2016   Anemia 12/14/2016   Cortical age-related cataract of right eye 11/20/2016   Macular hole of left eye 11/20/2016   Nuclear sclerotic cataract of right eye 11/20/2016   Presbyopia of both eyes 11/20/2016   PVD (posterior vitreous detachment), right 11/20/2016   Vaginal dysplasia 09/20/2016   Muscle spasm of left lower extremity 05/11/2016   Swelling of joint of left knee 05/11/2016   Dysplasia of cervix, low grade (CIN 1) 05/11/2016   High risk medication use 05/11/2016   Thyroid activity decreased 11/19/2014   Pain, joint, multiple sites 11/19/2014   Numbness and tingling of left leg 04/06/2014   Routine general medical examination at a health care facility 09/29/2013   Osteoporosis, post-menopausal 04/21/2013   Cough variant asthma 03/17/2013   Psoriasis 03/17/2013   Persistent dry cough 03/17/2013   Osteoarthritis of left knee 03/03/2013   Psoriasis-like skin disease 03/03/2013   Eczema 01/07/2013   Need for prophylactic vaccination and inoculation against influenza 01/07/2013   Prediabetes 12/17/2012   Cough 12/17/2012   Reactive airway disease with wheezing 12/03/2012   URI, acute 12/03/2012   Hypothyroidism 10/29/2012   Unspecified vitamin D deficiency 10/29/2012   Hyperlipidemia LDL goal <130 10/29/2012   GERD (gastroesophageal reflux disease) 10/29/2012   Osteoporosis 10/29/2012   Myalgia and myositis 10/29/2012    Juel Burrow, PT, DPT 11/30/2020, 10:27  AM  [place]. [place], [place], [address] Phone: [phone]   Fax:  [phone]  Name: Shalonda Sachse MRN: [id number] Date of Birth: 09/21/1945

## 2020-12-05 ENCOUNTER — Encounter: Payer: Self-pay | Admitting: Physical Therapy

## 2020-12-05 ENCOUNTER — Ambulatory Visit: Payer: Medicare Other | Attending: Orthopedic Surgery | Admitting: Physical Therapy

## 2020-12-05 ENCOUNTER — Other Ambulatory Visit: Payer: Self-pay

## 2020-12-05 DIAGNOSIS — R262 Difficulty in walking, not elsewhere classified: Secondary | ICD-10-CM

## 2020-12-05 DIAGNOSIS — M25562 Pain in left knee: Secondary | ICD-10-CM

## 2020-12-05 DIAGNOSIS — M25561 Pain in right knee: Secondary | ICD-10-CM | POA: Diagnosis present

## 2020-12-05 DIAGNOSIS — G8929 Other chronic pain: Secondary | ICD-10-CM | POA: Diagnosis present

## 2020-12-05 DIAGNOSIS — M25661 Stiffness of right knee, not elsewhere classified: Secondary | ICD-10-CM | POA: Diagnosis present

## 2020-12-05 NOTE — Therapy (Signed)
[place] [place], [place], [address] Phone: [phone]   Fax:  [phone]  Physical Therapy Treatment  Patient Details  Name: [name] [name] MRN: [id number] Date of Birth: [birth year] Referring Provider (PT): Fenton Foy, [place]   Encounter Date: 12/05/2020   PT End of Session - 12/05/20 1018     Visit Number 17    Date for PT Re-Evaluation 12/10/20    Authorization Type [employer] Medicare    PT Start Time 0934    PT Stop Time 1020   ice pack   PT Time Calculation (min) 46 min    Activity Tolerance Patient tolerated treatment well;Patient limited by pain    Behavior During Therapy [facility] for tasks assessed/performed             Past Medical History:  Diagnosis Date   Acute bronchiolitis due to other infectious organisms    Acute bronchiolitis due to respiratory syncytial virus (RSV)    Arthritis    OA AND PAIN RIGHT KNEE   Degeneration of intervertebral disc, site unspecified    Disorder of bone and cartilage, unspecified    Disturbance of skin sensation    Encounter for long-term (current) use of other medications    GERD (gastroesophageal reflux disease)    PT STATES SEVERE REFLUX IF SHE DOES NOT TAKE HER OMEPRAZOLE   Headache(784.0)    WHEN REFLUX SEVERE   Hypothyroidism    Long term (current) use of anticoagulants    Osteoarthrosis, unspecified whether generalized or localized, lower leg    Osteoporosis, unspecified    Other and unspecified hyperlipidemia    Other atopic dermatitis and related conditions    Other malaise and fatigue    Other specified pre-operative examination 11/07/2011   Thoracic or lumbosacral neuritis or radiculitis, unspecified    Unspecified vitamin D deficiency    Vegetarian diet     Past Surgical History:  Procedure Laterality Date   CERVICAL BIOPSY  W/ LOOP ELECTRODE EXCISION     EYE SURGERY  2016   Left eye, Cataract Removal   TEETH EXTRACTIONS--NO OTHER  SURGERY     TOTAL KNEE ARTHROPLASTY  11/28/2011   Procedure: TOTAL KNEE ARTHROPLASTY;  Surgeon: Tobi Bastos, MD;  Location: [facility];  Service: Orthopedics;  Laterality: Right;   TUBAL LIGATION      There were no vitals filed for this visit.   Subjective Assessment - 12/05/20 0936     Subjective Not much is new. Husband reports that the patient benefits from massage.    Patient is accompained by: Family member;Interpreter   husband present for interpreting   Pertinent History right TKA    Patient Stated Goals have no pain, do stairs normally    Currently in Pain? Yes    Pain Score 3     Pain Location Knee    Pain Orientation Left    Pain Descriptors / Indicators Tingling    Pain Type Chronic pain                               [facility] Adult PT Treatment/Exercise - 12/05/20 0001       Lumbar Exercises: Supine   Other Supine Lumbar Exercises bride w clamshell red TB 2x10   limited ROM     Knee/Hip Exercises: Stretches   Hip Flexor Stretch Left;2 reps;30 seconds    Hip Flexor Stretch Limitations mod thomas with strap  ITB Stretch Left;30 seconds;3 reps    ITB Stretch Limitations supine with strap      Knee/Hip Exercises: Aerobic   Nustep level 5 x 6 minutes UEs/LEs      Knee/Hip Exercises: Standing   Forward Step Up Left;1 set;15 reps;Hand Hold: 1;Step Height: 4"   step up/back slow   Other Standing Knee Exercises sidestepping with red TB around ankles 10x each      Knee/Hip Exercises: Seated   Sit to Sand 10 reps;without UE support;2 sets   red TB above knees; cues to avoid valgus upon sit down     Knee/Hip Exercises: Supine   Straight Leg Raises Strengthening;Left;1 set;10 reps    Straight Leg Raises Limitations cueing to contract quads throughout      Modalities   Modalities Cryotherapy      Cryotherapy   Number Minutes Cryotherapy 5 Minutes    Cryotherapy Location Knee   L   Type of Cryotherapy Ice pack      Manual Therapy   Manual  Therapy Soft tissue mobilization;Myofascial release    Manual therapy comments supine    Soft tissue mobilization STM to L TFL, midline and lateral quads    Myofascial Release manual TPR to L TFL, hip flexors, quads   multiple palpable trigger points evident                     PT Short Term Goals - 09/28/20 1035       PT SHORT TERM GOAL #1   Title independent with initial HEP    Status Achieved               PT Long Term Goals - 11/30/20 0931       PT LONG TERM GOAL #1   Status Partially Met      PT LONG TERM GOAL #3   Title go up and down stairs step over step    Status Achieved      PT LONG TERM GOAL #4   Title report sleep 50% better    Status Achieved                   Plan - 12/05/20 1019     Clinical Impression Statement Patient arrived to session with husband acting as interpreter. Husband reports that patient seems to benefit from LE massage. Patient performed gentle LE stretching without complaints. Limited hip extension ROM was evident with bridges. Patient tolerated STM and TPR to L thigh musculature with multiple palpable trigger points evident. Patient noted L knee pain after step ups today, thus ended session with icepack to L knee. No further complaints at end of session.    PT Treatment/Interventions ADLs/Self Care Home Management;Cryotherapy;Electrical Stimulation;Iontophoresis 84m/ml Dexamethasone;Moist Heat;Ultrasound;Gait training;Neuromuscular re-education;Balance training;Therapeutic exercise;Therapeutic activities;Functional mobility training;Stair training;Patient/family education;Manual techniques;Dry needling    PT Next Visit Plan progress hip flexor/quad stretch, strengthening of lateral quad, core stability    PT Home Exercise Plan Access Code: [email or web address]   Consulted and Agree with Plan of Care Patient;Family member/caregiver             Patient will benefit from skilled therapeutic intervention in order to improve  the following deficits and impairments:  Abnormal gait, Decreased mobility, Decreased range of motion, Decreased strength, Difficulty walking, Impaired flexibility, Pain, Decreased balance  Visit Diagnosis: Acute pain of left knee  Difficulty in walking, not elsewhere classified  Stiffness of right knee, not elsewhere classified  Chronic pain of right knee  Problem List Patient Active Problem List   Diagnosis Date Noted   History of total knee replacement, right 12/26/2018   Osteoarthritis 12/26/2018   Dermatochalasis of both upper eyelids 02/14/2018   Posterior capsular opacification non visually significant of left eye 02/14/2018   Pseudophakia of left eye 02/14/2018   Estrogen deficiency 12/14/2016   Anemia 12/14/2016   Cortical age-related cataract of right eye 11/20/2016   Macular hole of left eye 11/20/2016   Nuclear sclerotic cataract of right eye 11/20/2016   Presbyopia of both eyes 11/20/2016   PVD (posterior vitreous detachment), right 11/20/2016   Vaginal dysplasia 09/20/2016   Muscle spasm of left lower extremity 05/11/2016   Swelling of joint of left knee 05/11/2016   Dysplasia of cervix, low grade (CIN 1) 05/11/2016   High risk medication use 05/11/2016   Thyroid activity decreased 11/19/2014   Pain, joint, multiple sites 11/19/2014   Numbness and tingling of left leg 04/06/2014   Routine general medical examination at a health care facility 09/29/2013   Osteoporosis, post-menopausal 04/21/2013   Cough variant asthma 03/17/2013   Psoriasis 03/17/2013   Persistent dry cough 03/17/2013   Osteoarthritis of left knee 03/03/2013   Psoriasis-like skin disease 03/03/2013   Eczema 01/07/2013   Need for prophylactic vaccination and inoculation against influenza 01/07/2013   Prediabetes 12/17/2012   Cough 12/17/2012   Reactive airway disease with wheezing 12/03/2012   URI, acute 12/03/2012   Hypothyroidism 10/29/2012   Unspecified vitamin D deficiency  10/29/2012   Hyperlipidemia LDL goal <130 10/29/2012   GERD (gastroesophageal reflux disease) 10/29/2012   Osteoporosis 10/29/2012   Myalgia and myositis 10/29/2012     Janene Harvey, PT, DPT 12/05/20 10:25 AM    [address] [place], [place], [address] Phone: [phone]   Fax:  [phone]  Name: [name] [name] MRN: [id number] Date of Birth: [birth year]

## 2020-12-07 ENCOUNTER — Other Ambulatory Visit: Payer: Self-pay

## 2020-12-07 ENCOUNTER — Encounter: Payer: Self-pay | Admitting: Physical Therapy

## 2020-12-07 ENCOUNTER — Ambulatory Visit: Payer: Medicare Other | Admitting: Physical Therapy

## 2020-12-07 DIAGNOSIS — R262 Difficulty in walking, not elsewhere classified: Secondary | ICD-10-CM

## 2020-12-07 DIAGNOSIS — M25562 Pain in left knee: Secondary | ICD-10-CM | POA: Diagnosis not present

## 2020-12-07 DIAGNOSIS — M25661 Stiffness of right knee, not elsewhere classified: Secondary | ICD-10-CM

## 2020-12-07 DIAGNOSIS — G8929 Other chronic pain: Secondary | ICD-10-CM

## 2020-12-07 NOTE — Therapy (Signed)
[place]. [place], [place], [address] Phone: [phone]   Fax:  [phone]  Physical Therapy Discharge Summary  Patient Details  Name: [name] [name] MRN: [id number] Date of Birth: 19-Jul-1945 Referring Provider (PT): Fenton Foy, [place]   Encounter Date: 12/07/2020   PT End of Session - 12/07/20 1012     Visit Number 18    Date for PT Re-Evaluation 12/10/20    Authorization Type [employer] Medicare    PT Start Time 0931    PT Stop Time 1010    PT Time Calculation (min) 39 min    Activity Tolerance Patient tolerated treatment well;Patient limited by pain    Behavior During Therapy [facility] for tasks assessed/performed             Past Medical History:  Diagnosis Date   Acute bronchiolitis due to other infectious organisms    Acute bronchiolitis due to respiratory syncytial virus (RSV)    Arthritis    OA AND PAIN RIGHT KNEE   Degeneration of intervertebral disc, site unspecified    Disorder of bone and cartilage, unspecified    Disturbance of skin sensation    Encounter for long-term (current) use of other medications    GERD (gastroesophageal reflux disease)    PT STATES SEVERE REFLUX IF SHE DOES NOT TAKE HER OMEPRAZOLE   Headache(784.0)    WHEN REFLUX SEVERE   Hypothyroidism    Long term (current) use of anticoagulants    Osteoarthrosis, unspecified whether generalized or localized, lower leg    Osteoporosis, unspecified    Other and unspecified hyperlipidemia    Other atopic dermatitis and related conditions    Other malaise and fatigue    Other specified pre-operative examination 11/07/2011   Thoracic or lumbosacral neuritis or radiculitis, unspecified    Unspecified vitamin D deficiency    Vegetarian diet     Past Surgical History:  Procedure Laterality Date   CERVICAL BIOPSY  W/ LOOP ELECTRODE EXCISION     EYE SURGERY  2016   Left eye, Cataract Removal   TEETH EXTRACTIONS--NO OTHER  SURGERY     TOTAL KNEE ARTHROPLASTY  11/28/2011   Procedure: TOTAL KNEE ARTHROPLASTY;  Surgeon: Tobi Bastos, MD;  Location: [facility];  Service: Orthopedics;  Laterality: Right;   TUBAL LIGATION      There were no vitals filed for this visit.   Subjective Assessment - 12/07/20 0933     Subjective Still having pain over the L thigh. However notes 85% improvement since initial eval. Still has some pain with stairs. Reports 50% improvement in sleeping tolerance d/t pain relief.    Patient is accompained by: Family member;Interpreter   husband   Pertinent History right TKA    Limitations Standing;Walking;House hold activities    Patient Stated Goals have no pain, do stairs normally    Currently in Pain? Yes    Pain Score 3     Pain Location Knee    Pain Orientation Left    Pain Descriptors / Indicators Tingling    Pain Type Chronic pain                [facility] PT Assessment - 12/07/20 0943       Assessment   Medical Diagnosis knee pain    Referring Provider (PT) Fenton Foy, PA    Onset Date/Surgical Date 08/10/20      Observation/Other Assessments   Focus on Therapeutic Outcomes (FOTO)  knee: 64  [facility] Adult PT Treatment/Exercise - 12/07/20 0001       Ambulation/Gait   Gait Comments alternating reciprocal pattern up/down small staircase with 1 UE support; however pt reports she performs step-to at home d/t pain      Knee/Hip Exercises: Aerobic   Nustep level 5 x 6 minutes UEs/LEs      Knee/Hip Exercises: Supine   Bridges with Clamshell Strengthening;1 set;10 reps   red TB above knees     Knee/Hip Exercises: Sidelying   Hip ABduction Strengthening;Left;10 reps;2 sets    Hip ABduction Limitations cues for proper alignment; 2nd set with red TB above knees      Manual Therapy   Manual Therapy Soft tissue mobilization;Myofascial release    Manual therapy comments supine    Soft tissue mobilization STM to L TFL, midline and  lateral quads    Myofascial Release manual TPR to L TFL, hip flexors, quads                    PT Education - 12/07/20 1011     Education Details update/consolidation of HEP; discussion on objective progress and remaining impairments; edu on benefits of rolling pin self-massage and ice/heat for 10 min for pain relief    Person(s) Educated Patient;Spouse    Methods Explanation;Demonstration;Tactile cues;Handout;Verbal cues    Comprehension Verbalized understanding;Returned demonstration              PT Short Term Goals - 12/07/20 1014       PT SHORT TERM GOAL #1   Title independent with initial HEP    Status Achieved               PT Long Term Goals - 12/07/20 1014       PT LONG TERM GOAL #1   Title decrease pain 50%    Status Achieved      PT LONG TERM GOAL #2   Title increase right knee AROM to 110 degrees flexion    Status Partially Met   NT today     PT LONG TERM GOAL #3   Title go up and down stairs step over step    Status Partially Met   able to perform in clinic, however limited to step-to pattern at home     PT LONG TERM GOAL #4   Title report sleep 50% better    Status Achieved                   Plan - 12/07/20 1012     Clinical Impression Statement Patient arrived to session with report of 85% improvement in L thigh pain since initial eval. Notes some remaining pain with stairs, however is sleeping 50% better d/t improved pain levels. Patient today was able to demonstrate reciprocal alternating pattern up/down stairs with c/o mild L knee pain. Notes that she is limited to step-to pattern at home d/t pain. Worked on progressive hip strengthening ther-ex today with intermittent cues for form and alignment. Educated patient and husband on importance of continued HEP compliance and benefits of ice/heat for improvement in pain. Both reported understanding. Ended session with MT to address soft tissue restriction in L thigh musculature.  Patient without complaints at end of session. Patient has demonstrated good progress towards goals and is ready to DC with transition to HEP at this time.    PT Treatment/Interventions ADLs/Self Care Home Management;Cryotherapy;Electrical Stimulation;Iontophoresis 11m/ml Dexamethasone;Moist Heat;Ultrasound;Gait training;Neuromuscular re-education;Balance training;Therapeutic exercise;Therapeutic activities;Functional mobility training;Stair training;Patient/family education;Manual techniques;Dry needling  PT Next Visit Plan DC at this time    PT Home Exercise Plan Access Code: [email or web address]    Consulted and Agree with Plan of Care Patient;Family member/caregiver    Family Member Consulted husband             Patient will benefit from skilled therapeutic intervention in order to improve the following deficits and impairments:  Abnormal gait, Decreased mobility, Decreased range of motion, Decreased strength, Difficulty walking, Impaired flexibility, Pain, Decreased balance  Visit Diagnosis: Acute pain of left knee  Difficulty in walking, not elsewhere classified  Stiffness of right knee, not elsewhere classified  Chronic pain of right knee     Problem List Patient Active Problem List   Diagnosis Date Noted   History of total knee replacement, right 12/26/2018   Osteoarthritis 12/26/2018   Dermatochalasis of both upper eyelids 02/14/2018   Posterior capsular opacification non visually significant of left eye 02/14/2018   Pseudophakia of left eye 02/14/2018   Estrogen deficiency 12/14/2016   Anemia 12/14/2016   Cortical age-related cataract of right eye 11/20/2016   Macular hole of left eye 11/20/2016   Nuclear sclerotic cataract of right eye 11/20/2016   Presbyopia of both eyes 11/20/2016   PVD (posterior vitreous detachment), right 11/20/2016   Vaginal dysplasia 09/20/2016   Muscle spasm of left lower extremity 05/11/2016   Swelling of joint of left knee 05/11/2016   Dysplasia  of cervix, low grade (CIN 1) 05/11/2016   High risk medication use 05/11/2016   Thyroid activity decreased 11/19/2014   Pain, joint, multiple sites 11/19/2014   Numbness and tingling of left leg 04/06/2014   Routine general medical examination at a health care facility 09/29/2013   Osteoporosis, post-menopausal 04/21/2013   Cough variant asthma 03/17/2013   Psoriasis 03/17/2013   Persistent dry cough 03/17/2013   Osteoarthritis of left knee 03/03/2013   Psoriasis-like skin disease 03/03/2013   Eczema 01/07/2013   Need for prophylactic vaccination and inoculation against influenza 01/07/2013   Prediabetes 12/17/2012   Cough 12/17/2012   Reactive airway disease with wheezing 12/03/2012   URI, acute 12/03/2012   Hypothyroidism 10/29/2012   Unspecified vitamin D deficiency 10/29/2012   Hyperlipidemia LDL goal <130 10/29/2012   GERD (gastroesophageal reflux disease) 10/29/2012   Osteoporosis 10/29/2012   Myalgia and myositis 10/29/2012    PHYSICAL THERAPY DISCHARGE SUMMARY  Visits from Start of Care: 18  Current functional level related to goals / functional outcomes: See above clinical impression   Remaining deficits: Remaining pain with stairs   Education / Equipment: HEP  Plan: Patient agrees to discharge.  Patient goals were partially met. Patient is being discharged due to meeting the stated rehab goals.      Janene Harvey, PT, DPT 12/07/20 11:00 AM   [place] [place], [place], [address] Phone: [phone]   Fax:  [phone]  Name: [name] [name] MRN: [id number] Date of Birth: 26-Mar-1946

## 2020-12-07 NOTE — Patient Instructions (Signed)
Access Code: [email or web address] URL: [email or web address] Date: 12/07/2020 Prepared by: Georgina Peer  Exercises Supine ITB Stretch with Strap - 2 x daily - 7 x weekly - 2 sets - 30 sec hold Supine Quadriceps Stretch with Strap on Table - 2 x daily - 7 x weekly - 2 sets - 30 sec hold Supine Bridge with Resistance Band - 2 x daily - 7 x weekly - 2 sets - 10 reps Clamshell with Resistance - 2 x daily - 7 x weekly - 2 sets - 10 reps Sidelying Hip Abduction with Resistance at Thighs - 2 x daily - 7 x weekly - 2 sets - 10 reps

## 2021-03-10 ENCOUNTER — Other Ambulatory Visit: Payer: Self-pay | Admitting: Internal Medicine

## 2021-03-10 DIAGNOSIS — M8589 Other specified disorders of bone density and structure, multiple sites: Secondary | ICD-10-CM

## 2022-07-04 ENCOUNTER — Other Ambulatory Visit: Payer: Self-pay | Admitting: Internal Medicine

## 2022-07-04 DIAGNOSIS — Z1231 Encounter for screening mammogram for malignant neoplasm of breast: Secondary | ICD-10-CM

## 2022-08-15 ENCOUNTER — Ambulatory Visit: Payer: Medicare Other

## 2022-08-21 ENCOUNTER — Ambulatory Visit
Admission: RE | Admit: 2022-08-21 | Discharge: 2022-08-21 | Disposition: A | Discharge status: Home / Self Care | Payer: 59 | Source: Ambulatory Visit | Attending: Internal Medicine | Admitting: Internal Medicine

## 2022-08-21 DIAGNOSIS — Z1231 Encounter for screening mammogram for malignant neoplasm of breast: Secondary | ICD-10-CM

## 2022-10-16 IMAGING — MG DIGITAL SCREENING BILAT W/ TOMO W/ CAD
8 series · 8 of 24 positions shown · non-contrast
Comparison: Previous exam(s).

CLINICAL DATA: Screening.

EXAM:
DIGITAL SCREENING BILATERAL MAMMOGRAM WITH TOMO AND CAD

[L MLO synth-2D]
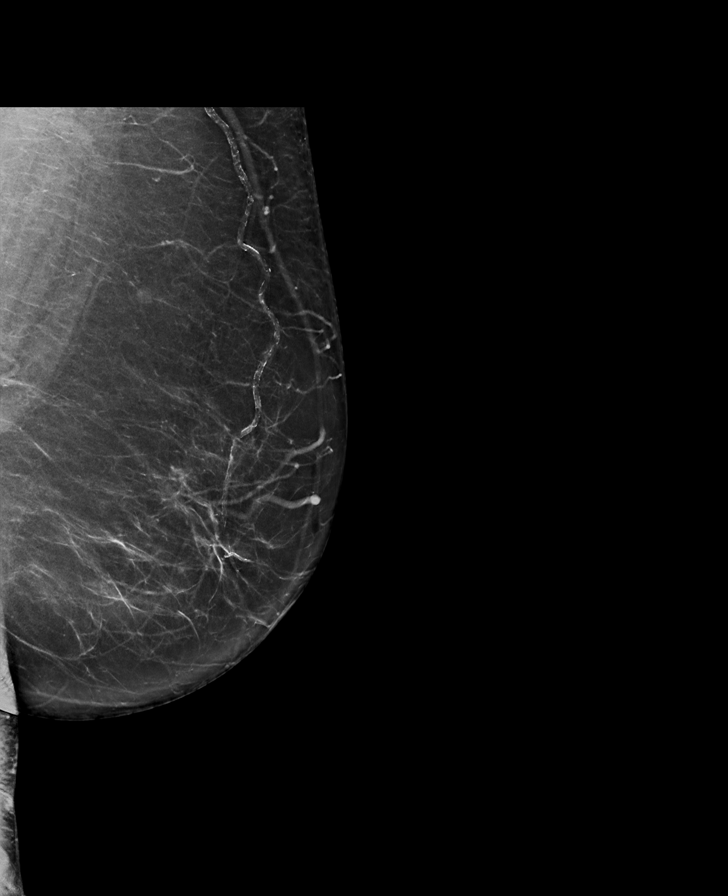

[L CC synth-2D]
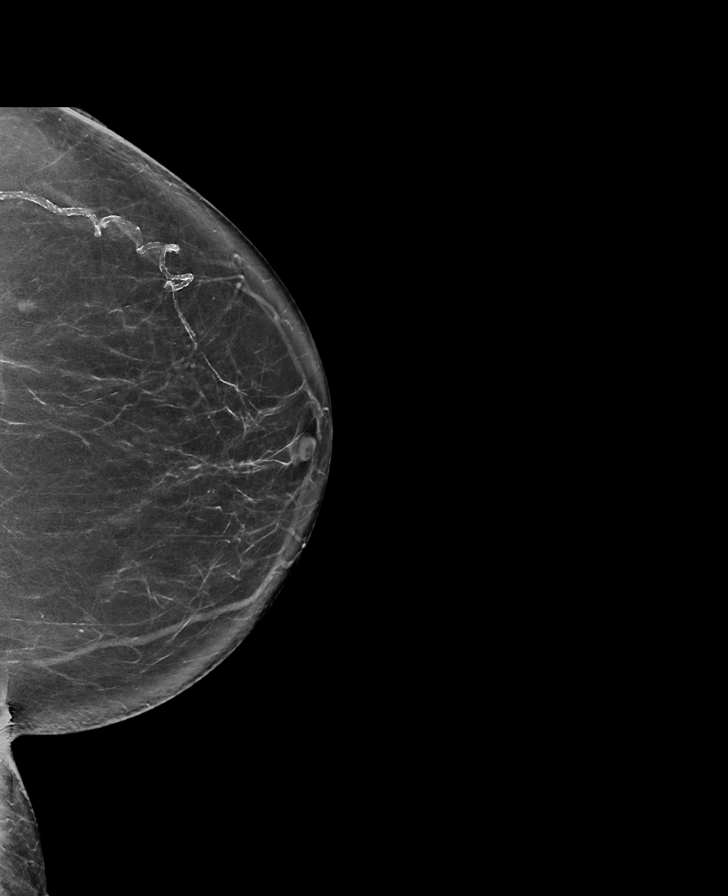

[R MLO synth-2D]
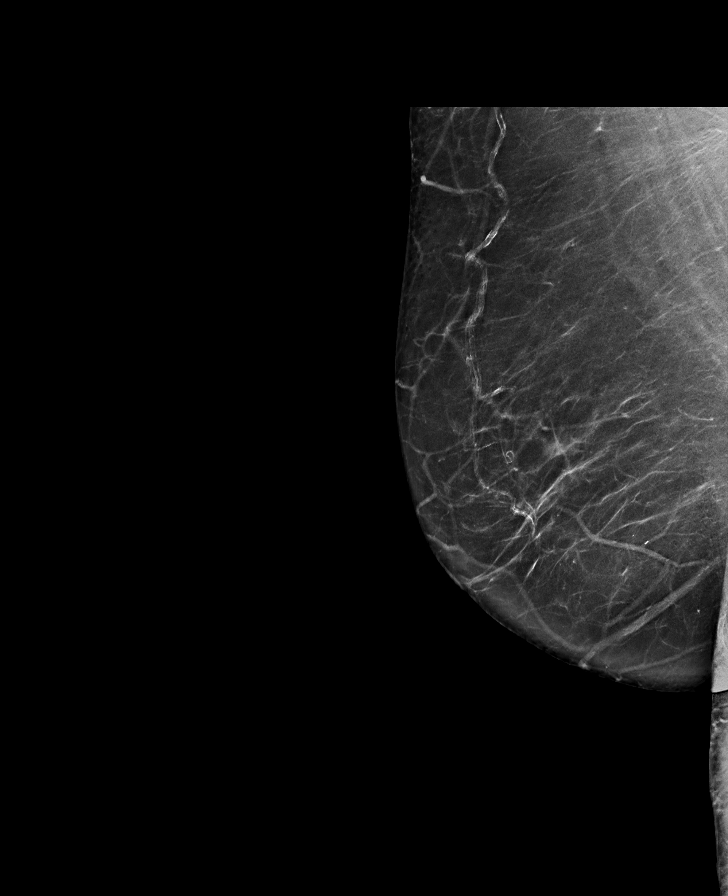

[R CC synth-2D]
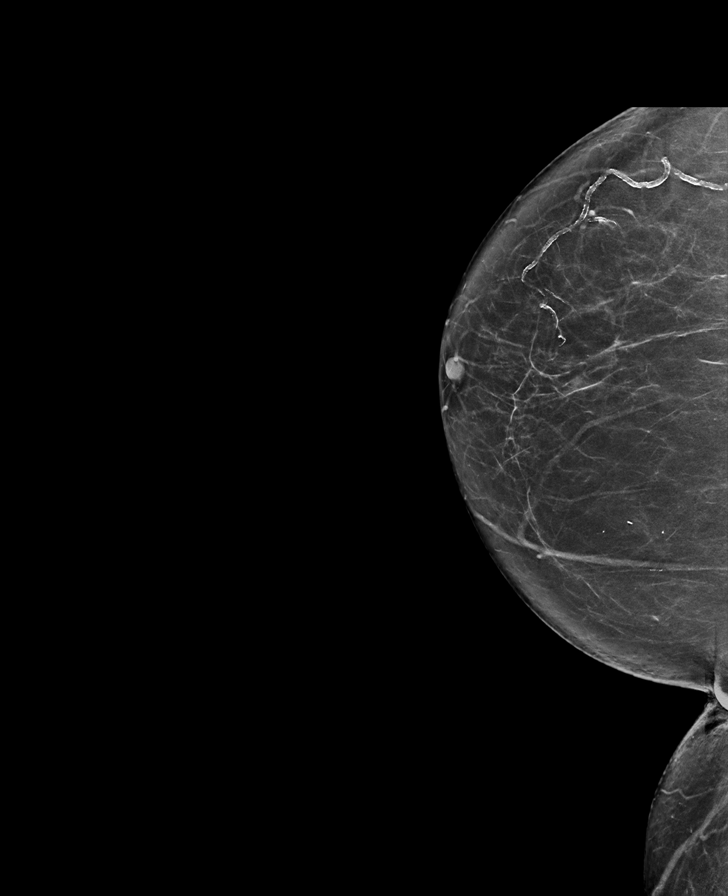

[L CC tomo · tomo slice 41/81.0]
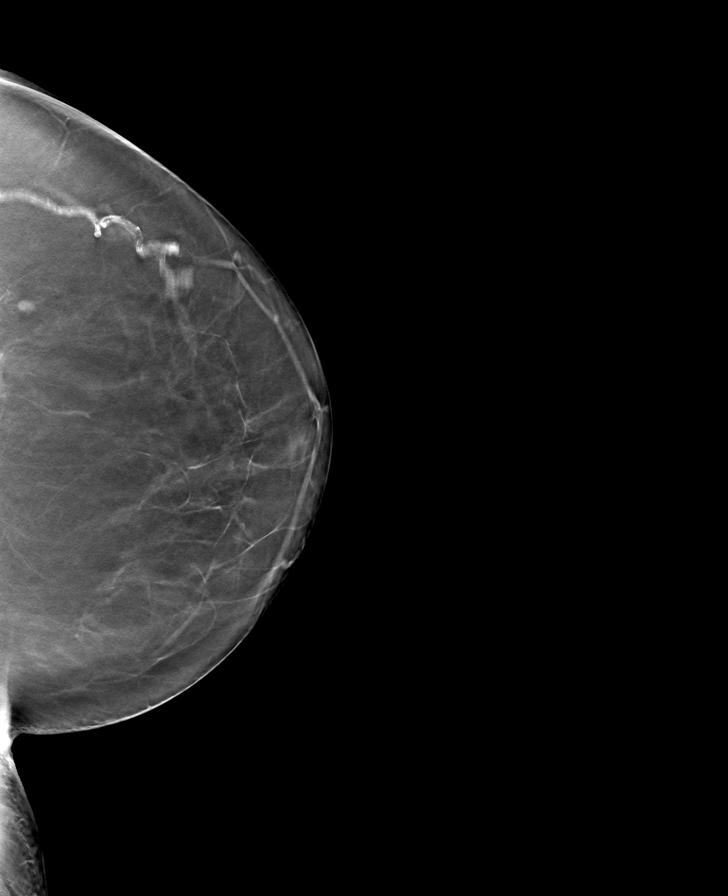

[R CC tomo · tomo slice 37/74.0]
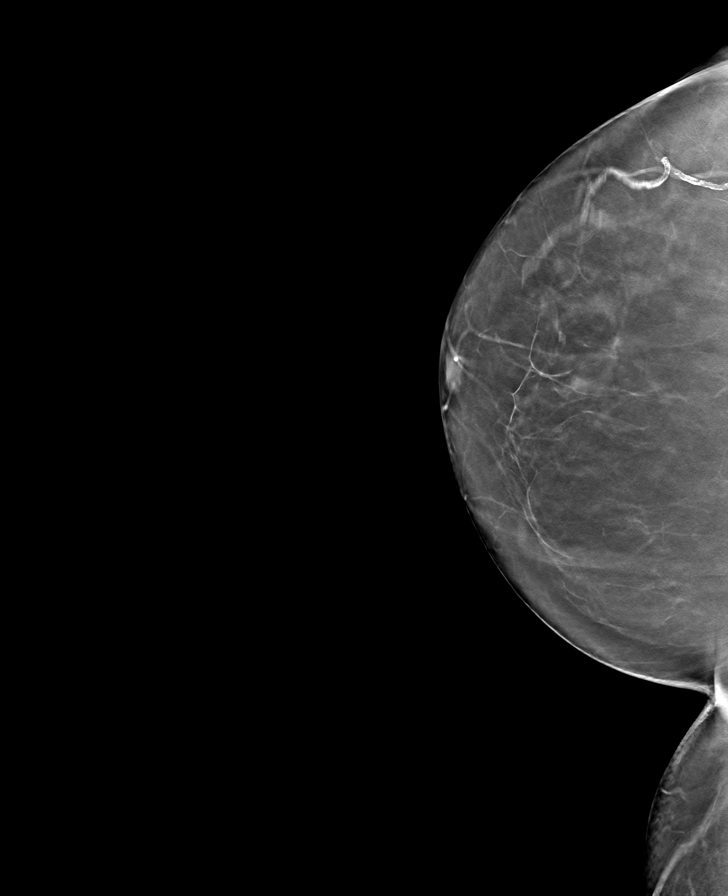

[L MLO tomo · tomo slice 43/84.0]
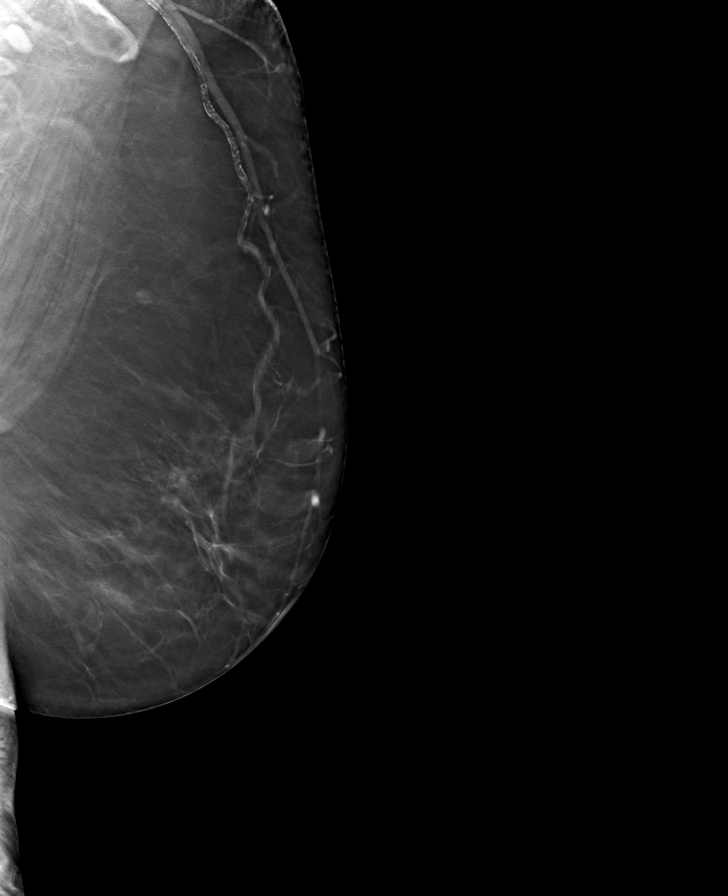

[R MLO tomo · tomo slice 41/81.0]
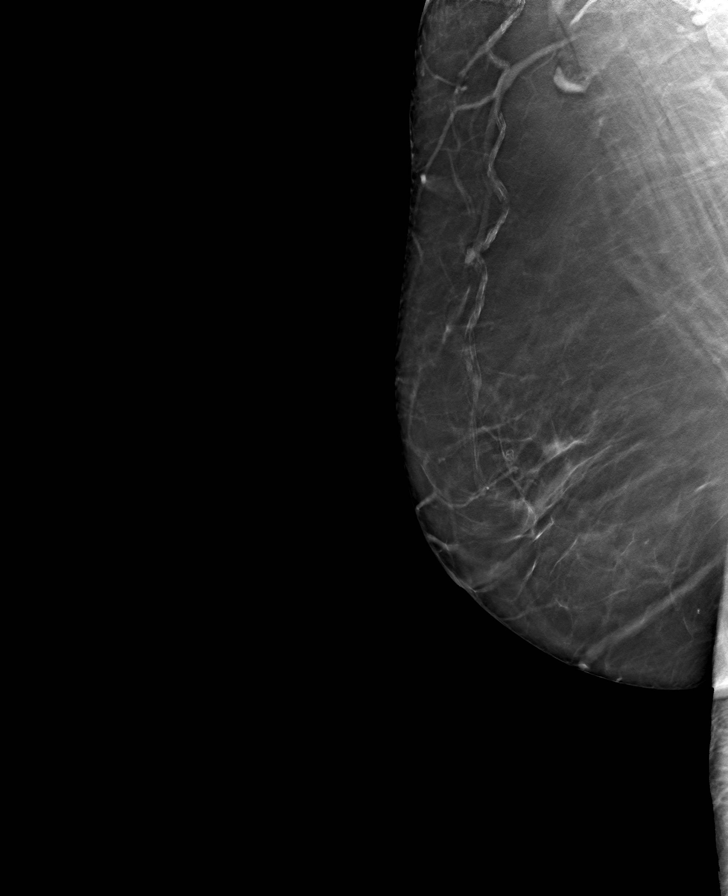

[8 of 24 positions shown; findings below may reference images not displayed]

ACR Breast Density Category b: There are scattered areas of
fibroglandular density.
FINDINGS: There are no findings suspicious for malignancy. Images were
processed with CAD.
IMPRESSION: No mammographic evidence of malignancy. A result letter of this
screening mammogram will be mailed directly to the patient.

RECOMMENDATION:
Screening mammogram in one year. (Code:[id number]-[id number])

BI-RADS CATEGORY  1: Negative.

## 2022-10-22 ENCOUNTER — Other Ambulatory Visit: Payer: Self-pay | Admitting: Internal Medicine

## 2022-10-22 DIAGNOSIS — M8580 Other specified disorders of bone density and structure, unspecified site: Secondary | ICD-10-CM

## 2023-10-08 ENCOUNTER — Other Ambulatory Visit: Payer: Self-pay | Admitting: Internal Medicine

## 2023-10-08 DIAGNOSIS — Z1231 Encounter for screening mammogram for malignant neoplasm of breast: Secondary | ICD-10-CM

## 2023-11-06 ENCOUNTER — Ambulatory Visit
Admission: RE | Admit: 2023-11-06 | Discharge: 2023-11-06 | Disposition: A | Discharge status: Home / Self Care | Source: Ambulatory Visit | Attending: Internal Medicine | Admitting: Internal Medicine

## 2023-11-06 DIAGNOSIS — Z1231 Encounter for screening mammogram for malignant neoplasm of breast: Secondary | ICD-10-CM
